# Patient Record
Sex: Female | Born: 1940 | Race: White | Hispanic: No | Smoking: Former smoker
Health system: Southern US, Community
[De-identification: ages and names within clinical notes are randomized; demographics above are authoritative.]

## PROBLEM LIST (undated history)

## (undated) DIAGNOSIS — Z972 Presence of dental prosthetic device (complete) (partial): Secondary | ICD-10-CM

## (undated) DIAGNOSIS — G43909 Migraine, unspecified, not intractable, without status migrainosus: Secondary | ICD-10-CM

## (undated) DIAGNOSIS — G20 Parkinson's disease: Secondary | ICD-10-CM

## (undated) DIAGNOSIS — R519 Headache, unspecified: Secondary | ICD-10-CM

## (undated) DIAGNOSIS — G20C Parkinsonism, unspecified: Secondary | ICD-10-CM

## (undated) DIAGNOSIS — M51369 Other intervertebral disc degeneration, lumbar region without mention of lumbar back pain or lower extremity pain: Secondary | ICD-10-CM

## (undated) DIAGNOSIS — K219 Gastro-esophageal reflux disease without esophagitis: Secondary | ICD-10-CM

## (undated) DIAGNOSIS — T8859XA Other complications of anesthesia, initial encounter: Secondary | ICD-10-CM

## (undated) DIAGNOSIS — E785 Hyperlipidemia, unspecified: Secondary | ICD-10-CM

## (undated) DIAGNOSIS — K589 Irritable bowel syndrome without diarrhea: Secondary | ICD-10-CM

## (undated) DIAGNOSIS — I10 Essential (primary) hypertension: Secondary | ICD-10-CM

## (undated) DIAGNOSIS — C801 Malignant (primary) neoplasm, unspecified: Secondary | ICD-10-CM

## (undated) DIAGNOSIS — M1990 Unspecified osteoarthritis, unspecified site: Secondary | ICD-10-CM

## (undated) DIAGNOSIS — F329 Major depressive disorder, single episode, unspecified: Secondary | ICD-10-CM

## (undated) DIAGNOSIS — K5790 Diverticulosis of intestine, part unspecified, without perforation or abscess without bleeding: Secondary | ICD-10-CM

## (undated) DIAGNOSIS — Z860100 Personal history of colon polyps, unspecified: Secondary | ICD-10-CM

## (undated) DIAGNOSIS — R42 Dizziness and giddiness: Secondary | ICD-10-CM

## (undated) DIAGNOSIS — F419 Anxiety disorder, unspecified: Secondary | ICD-10-CM

## (undated) DIAGNOSIS — C519 Malignant neoplasm of vulva, unspecified: Secondary | ICD-10-CM

## (undated) DIAGNOSIS — J449 Chronic obstructive pulmonary disease, unspecified: Secondary | ICD-10-CM

## (undated) DIAGNOSIS — F32A Depression, unspecified: Secondary | ICD-10-CM

## (undated) DIAGNOSIS — G20A1 Parkinson's disease without dyskinesia, without mention of fluctuations: Secondary | ICD-10-CM

## (undated) DIAGNOSIS — M5136 Other intervertebral disc degeneration, lumbar region: Secondary | ICD-10-CM

## (undated) DIAGNOSIS — T753XXA Motion sickness, initial encounter: Secondary | ICD-10-CM

## (undated) HISTORY — DX: Parkinson's disease: G20

## (undated) HISTORY — PX: TUBAL LIGATION: SHX77

## (undated) HISTORY — DX: Major depressive disorder, single episode, unspecified: F32.9

## (undated) HISTORY — DX: Irritable bowel syndrome, unspecified: K58.9

## (undated) HISTORY — DX: Parkinsonism, unspecified: G20.C

## (undated) HISTORY — DX: Personal history of colonic polyps: Z86.010

## (undated) HISTORY — DX: Unspecified osteoarthritis, unspecified site: M19.90

## (undated) HISTORY — DX: Malignant neoplasm of vulva, unspecified: C51.9

## (undated) HISTORY — DX: Chronic obstructive pulmonary disease, unspecified: J44.9

## (undated) HISTORY — DX: Migraine, unspecified, not intractable, without status migrainosus: G43.909

## (undated) HISTORY — DX: Hyperlipidemia, unspecified: E78.5

## (undated) HISTORY — DX: Diverticulosis of intestine, part unspecified, without perforation or abscess without bleeding: K57.90

## (undated) HISTORY — DX: Anxiety disorder, unspecified: F41.9

## (undated) HISTORY — DX: Personal history of colon polyps, unspecified: Z86.0100

## (undated) HISTORY — DX: Malignant (primary) neoplasm, unspecified: C80.1

## (undated) HISTORY — DX: Gastro-esophageal reflux disease without esophagitis: K21.9

## (undated) HISTORY — DX: Other intervertebral disc degeneration, lumbar region: M51.36

## (undated) HISTORY — DX: Essential (primary) hypertension: I10

## (undated) HISTORY — PX: CHOLECYSTECTOMY: SHX55

## (undated) HISTORY — DX: Other intervertebral disc degeneration, lumbar region without mention of lumbar back pain or lower extremity pain: M51.369

## (undated) HISTORY — DX: Depression, unspecified: F32.A

---

## 1971-11-26 HISTORY — PX: BREAST BIOPSY: SHX20

## 2004-10-09 ENCOUNTER — Ambulatory Visit: Payer: Self-pay | Admitting: Internal Medicine

## 2006-01-08 ENCOUNTER — Ambulatory Visit: Payer: Self-pay | Admitting: Internal Medicine

## 2006-09-17 ENCOUNTER — Ambulatory Visit: Payer: Self-pay | Admitting: Unknown Physician Specialty

## 2006-09-22 ENCOUNTER — Ambulatory Visit: Payer: Self-pay | Admitting: Internal Medicine

## 2007-01-12 ENCOUNTER — Ambulatory Visit: Payer: Self-pay | Admitting: Internal Medicine

## 2007-12-08 ENCOUNTER — Ambulatory Visit: Payer: Self-pay | Admitting: Internal Medicine

## 2007-12-30 ENCOUNTER — Ambulatory Visit: Payer: Self-pay | Admitting: General Surgery

## 2007-12-30 ENCOUNTER — Other Ambulatory Visit: Payer: Self-pay

## 2008-01-05 ENCOUNTER — Ambulatory Visit: Payer: Self-pay | Admitting: General Surgery

## 2008-01-19 ENCOUNTER — Ambulatory Visit: Payer: Self-pay | Admitting: Internal Medicine

## 2008-11-25 HISTORY — PX: KNEE ARTHROSCOPY: SUR90

## 2009-01-19 ENCOUNTER — Ambulatory Visit: Payer: Self-pay | Admitting: Internal Medicine

## 2009-02-20 ENCOUNTER — Ambulatory Visit: Payer: Self-pay | Admitting: General Practice

## 2009-03-08 ENCOUNTER — Ambulatory Visit: Payer: Self-pay | Admitting: General Practice

## 2009-03-14 ENCOUNTER — Ambulatory Visit: Payer: Self-pay | Admitting: General Practice

## 2009-10-02 ENCOUNTER — Ambulatory Visit: Payer: Self-pay | Admitting: Unknown Physician Specialty

## 2009-10-03 ENCOUNTER — Ambulatory Visit: Payer: Self-pay | Admitting: Unknown Physician Specialty

## 2009-10-13 ENCOUNTER — Ambulatory Visit: Payer: Self-pay | Admitting: General Practice

## 2009-10-18 ENCOUNTER — Inpatient Hospital Stay: Payer: Self-pay | Admitting: General Practice

## 2009-10-18 HISTORY — PX: TOTAL KNEE ARTHROPLASTY: SHX125

## 2010-01-22 ENCOUNTER — Ambulatory Visit: Payer: Self-pay | Admitting: Internal Medicine

## 2010-09-25 ENCOUNTER — Ambulatory Visit: Payer: Self-pay | Admitting: General Practice

## 2010-10-08 ENCOUNTER — Inpatient Hospital Stay: Payer: Self-pay | Admitting: General Practice

## 2010-10-08 HISTORY — PX: TOTAL KNEE ARTHROPLASTY: SHX125

## 2010-11-25 HISTORY — PX: KNEE ARTHROSCOPY: SUR90

## 2011-01-23 ENCOUNTER — Ambulatory Visit: Payer: Self-pay | Admitting: Internal Medicine

## 2011-09-26 ENCOUNTER — Ambulatory Visit: Payer: Self-pay | Admitting: Internal Medicine

## 2011-12-18 ENCOUNTER — Ambulatory Visit: Payer: Self-pay | Admitting: Internal Medicine

## 2012-01-03 ENCOUNTER — Emergency Department: Payer: Self-pay | Admitting: Internal Medicine

## 2012-01-03 LAB — BASIC METABOLIC PANEL
Anion Gap: 12 (ref 7–16)
BUN: 14 mg/dL (ref 7–18)
Calcium, Total: 9.2 mg/dL (ref 8.5–10.1)
Chloride: 103 mmol/L (ref 98–107)
Co2: 25 mmol/L (ref 21–32)
Creatinine: 1.03 mg/dL (ref 0.60–1.30)
EGFR (African American): >60
EGFR (Non-African Amer.): 56 — ABNORMAL LOW
Glucose: 94 mg/dL (ref 65–99)
Osmolality: 280 (ref 275–301)
Potassium: 4.3 mmol/L (ref 3.5–5.1)
Sodium: 140 mmol/L (ref 136–145)

## 2012-01-03 LAB — CBC
HCT: 39.2 % (ref 35.0–47.0)
HGB: 13.1 g/dL (ref 12.0–16.0)
MCH: 31.6 pg (ref 26.0–34.0)
MCHC: 33.5 g/dL (ref 32.0–36.0)
MCV: 94 fL (ref 80–100)
Platelet: 343 10*3/uL (ref 150–440)
RBC: 4.16 10*6/uL (ref 3.80–5.20)
RDW: 14.5 % (ref 11.5–14.5)
WBC: 7.8 10*3/uL (ref 3.6–11.0)

## 2012-01-28 ENCOUNTER — Ambulatory Visit: Payer: Self-pay | Admitting: Internal Medicine

## 2012-12-11 ENCOUNTER — Ambulatory Visit: Payer: Self-pay | Admitting: Internal Medicine

## 2012-12-18 ENCOUNTER — Ambulatory Visit: Payer: Self-pay | Admitting: Internal Medicine

## 2013-01-28 ENCOUNTER — Ambulatory Visit: Payer: Self-pay | Admitting: Internal Medicine

## 2014-01-18 ENCOUNTER — Ambulatory Visit: Payer: Self-pay | Admitting: Physical Medicine and Rehabilitation

## 2014-01-31 ENCOUNTER — Ambulatory Visit: Payer: Self-pay | Admitting: Internal Medicine

## 2014-02-02 ENCOUNTER — Ambulatory Visit: Payer: Self-pay | Admitting: Neurology

## 2014-02-07 ENCOUNTER — Ambulatory Visit: Payer: Self-pay | Admitting: Internal Medicine

## 2014-04-04 DIAGNOSIS — M5116 Intervertebral disc disorders with radiculopathy, lumbar region: Secondary | ICD-10-CM | POA: Insufficient documentation

## 2014-04-04 DIAGNOSIS — M5416 Radiculopathy, lumbar region: Secondary | ICD-10-CM | POA: Insufficient documentation

## 2014-04-04 DIAGNOSIS — M5136 Other intervertebral disc degeneration, lumbar region: Secondary | ICD-10-CM | POA: Insufficient documentation

## 2014-04-04 DIAGNOSIS — M5137 Other intervertebral disc degeneration, lumbosacral region: Secondary | ICD-10-CM | POA: Insufficient documentation

## 2014-07-08 DIAGNOSIS — I10 Essential (primary) hypertension: Secondary | ICD-10-CM | POA: Insufficient documentation

## 2014-07-08 DIAGNOSIS — F419 Anxiety disorder, unspecified: Secondary | ICD-10-CM

## 2014-07-08 DIAGNOSIS — F329 Major depressive disorder, single episode, unspecified: Secondary | ICD-10-CM | POA: Insufficient documentation

## 2014-07-08 DIAGNOSIS — G20 Parkinson's disease: Secondary | ICD-10-CM | POA: Insufficient documentation

## 2014-07-08 DIAGNOSIS — F32A Depression, unspecified: Secondary | ICD-10-CM | POA: Insufficient documentation

## 2014-07-08 DIAGNOSIS — E785 Hyperlipidemia, unspecified: Secondary | ICD-10-CM | POA: Insufficient documentation

## 2014-08-11 ENCOUNTER — Ambulatory Visit: Payer: Self-pay | Admitting: Internal Medicine

## 2014-12-05 ENCOUNTER — Ambulatory Visit: Payer: Self-pay | Admitting: Unknown Physician Specialty

## 2015-02-02 ENCOUNTER — Ambulatory Visit: Payer: Self-pay | Admitting: Internal Medicine

## 2015-02-07 ENCOUNTER — Ambulatory Visit: Payer: Self-pay | Admitting: Internal Medicine

## 2015-03-10 ENCOUNTER — Other Ambulatory Visit: Payer: Self-pay | Admitting: Internal Medicine

## 2015-03-10 DIAGNOSIS — R69 Illness, unspecified: Secondary | ICD-10-CM

## 2015-03-20 LAB — SURGICAL PATHOLOGY

## 2015-07-03 ENCOUNTER — Ambulatory Visit (INDEPENDENT_AMBULATORY_CARE_PROVIDER_SITE_OTHER): Payer: Medicare Other | Admitting: Urology

## 2015-07-03 ENCOUNTER — Encounter: Payer: Self-pay | Admitting: Urology

## 2015-07-03 VITALS — BP 114/73 | HR 97 | Ht 66.0 in | Wt 221.9 lb

## 2015-07-03 DIAGNOSIS — N952 Postmenopausal atrophic vaginitis: Secondary | ICD-10-CM | POA: Diagnosis not present

## 2015-07-03 DIAGNOSIS — R350 Frequency of micturition: Secondary | ICD-10-CM | POA: Insufficient documentation

## 2015-07-03 DIAGNOSIS — R300 Dysuria: Secondary | ICD-10-CM | POA: Diagnosis not present

## 2015-07-03 DIAGNOSIS — N3941 Urge incontinence: Secondary | ICD-10-CM

## 2015-07-03 LAB — URINALYSIS, COMPLETE
BILIRUBIN UA: NEGATIVE
Glucose, UA: NEGATIVE
Nitrite, UA: NEGATIVE
PH UA: 5 (ref 5.0–7.5)
Protein, UA: NEGATIVE
RBC, UA: NEGATIVE
Specific Gravity, UA: 1.025 (ref 1.005–1.030)
Urobilinogen, Ur: 1 mg/dL (ref 0.2–1.0)

## 2015-07-03 LAB — MICROSCOPIC EXAMINATION: WBC, UA: >30 /hpf — AB (ref 0–?)

## 2015-07-03 MED ORDER — MIRABEGRON ER 25 MG PO TB24: 25.0000 mg | ORAL_TABLET | Freq: Every day | ORAL | Status: DC

## 2015-07-03 NOTE — Addendum Note (Signed)
Addended by: Bettye Boeck on: 07/03/2015 04:29 PM   Modules accepted: Orders

## 2015-07-03 NOTE — Assessment & Plan Note (Addendum)
She has marked frequency and urgency with UUI and has had success with Detrol but not with oxybutynin or vesicare.  I am going to given her samples of Myrbetriq 25mg  and 50mg  to try and will reassess at 2 months.

## 2015-07-03 NOTE — Progress Notes (Signed)
In and Out Catheterization  Patient is present today for a I & O catheterization due to dysuria. Patient was cleaned and prepped in a sterile fashion with betadine and Lidocaine 2% jelly was instilled into the urethra.  A 14FR cath was inserted no complications were noted , 22ml of urine return was noted, urine was clear and yellow in color. A clean urine sample was collected for u/a. Bladder was drained  And catheter was removed with out difficulty.    Preformed by: Toniann Fail, LPN

## 2015-07-03 NOTE — Progress Notes (Signed)
62/70/3500 9:38 PM   Hannah Howard [birth year] [id number]  Referring provider: Idelle Crouch, MD [place], [place] [address]  Chief Complaint  Patient presents with  . Dysuria    HPI: Hannah Howard is a 74 yo WF who is sent in consultation by Dr. Doy Hutching for dysuria.   She can have concentrated urine with sediment and she can have severe intermittent burning in the vaginal area.    She had an unremarkable UA in June with a culture that was negative.   She has a vaginal cream that she uses prn but was causing additional pain.   She has not had UTI's but she has had marked frequency and urgency with UUI but no SUI.   She will has frequency 15+ times daily and nocturia 4-5x.   She was given tolteradine  in the past and that workedand  and then oxybutynin but it didn't help.  Most recently she has been on vesicare which didn't help.  She is on sinemet for Parkinsonism.    PMH: Past Medical History  Diagnosis Date  . GERD (gastroesophageal reflux disease)   . Anxiety   . Arthritis   . Depression   . Hyperlipidemia   . Parkinsonism     Surgical History: Past Surgical History  Procedure Laterality Date  . Knee arthroscopy Right 2010  . Knee arthroscopy Left 2012    Home Medications:    Medication List       This list is accurate as of: 07/03/15  3:52 PM.  Always use your most recent med list.               aspirin EC 81 MG tablet  Take by mouth.     carbidopa-levodopa 25-100 MG per tablet  Commonly known as:  SINEMET IR     celecoxib 200 MG capsule  Commonly known as:  CELEBREX  TAKE 1 CAPSULE EVERY DAY     clotrimazole-betamethasone cream  Commonly known as:  LOTRISONE  APPLY TOPICALLY TWO TIMES DAILY     hydrOXYzine 50 MG tablet  Commonly known as:  ATARAX/VISTARIL  TAKE ONE TABLET 3 TIMES DAILY AS NEEDED FOR ITCHING     lansoprazole 30 MG capsule  Commonly known as:  PREVACID     levocetirizine 5 MG tablet  Commonly known as:  XYZAL     lovastatin 40 MG tablet  Commonly known as:  MEVACOR     mirabegron ER 25 MG Tb24 tablet  Commonly known as:  MYRBETRIQ  Take 1 tablet (25 mg total) by mouth daily.     multivitamin capsule  Take by mouth.     sertraline 100 MG tablet  Commonly known as:  ZOLOFT     STOOL SOFTENER 100 MG capsule  Generic drug:  docusate sodium  Take by mouth.     traZODone 150 MG tablet  Commonly known as:  DESYREL     triamcinolone cream 0.1 %  Commonly known as:  KENALOG        Allergies: No Known Allergies  Family History: Family History  Problem Relation Age of Onset  . Hematuria    . Kidney failure    . Prostate cancer      Social History:  reports that she quit smoking about 15 years ago. Her smoking use included Cigarettes. She does not have any smokeless tobacco history on file. She reports that she does not drink alcohol or use illicit drugs.  ROS: UROLOGY Frequent  Urination?: Yes Hard to postpone urination?: Yes Burning/pain with urination?: No Get up at night to urinate?: Yes Leakage of urine?: Yes Urine stream starts and stops?: Yes Trouble starting stream?: No Do you have to strain to urinate?: No Blood in urine?: Yes Urinary tract infection?: No Sexually transmitted disease?: No Injury to kidneys or bladder?: No Painful intercourse?: No Weak stream?: No Currently pregnant?: No Vaginal bleeding?: No Last menstrual period?: n  Gastrointestinal Nausea?: Yes Vomiting?: Yes Indigestion/heartburn?: Yes Diarrhea?: Yes Constipation?: Yes  Constitutional Fever: No Night sweats?: No Weight loss?: No Fatigue?: Yes  Skin Skin rash/lesions?: No Itching?: No  Eyes Blurred vision?: No Double vision?: No  Ears/Nose/Throat Sore throat?: No Sinus problems?: Yes  Hematologic/Lymphatic Swollen glands?: No Easy bruising?: Yes  Cardiovascular Leg swelling?: Yes Chest pain?: No  Respiratory Cough?: No Shortness of breath?:  Yes  Endocrine Excessive thirst?: Yes  Musculoskeletal Back pain?: Yes Joint pain?: Yes  Neurological Headaches?: Yes Dizziness?: Yes  Psychologic Depression?: Yes Anxiety?: Yes  Physical Exam: BP 114/73 mmHg  Pulse 97  Ht 5\' 6"  (1.676 m)  Wt 221 lb 14.4 oz (100.653 kg)  BMI 35.83 kg/m2  Physical Exam  Constitutional: She is oriented to person, place, and time. She appears well-developed and well-nourished. No distress.  HENT:  Head: Normocephalic and atraumatic.  Neck: Normal range of motion. Neck supple. No thyromegaly present.  Cardiovascular: Normal rate, regular rhythm and normal heart sounds.   Pulmonary/Chest: Effort normal and breath sounds normal. No respiratory distress.  Abdominal: Soft. Bowel sounds are normal. She exhibits no mass. There is no tenderness. No hernia.  Genitourinary:  She has labial atrophy with some anterior fusion and introital stenosis.   The meatus is normal with good support and no leakage with coughing.  The bladder is palpably normal.   No adnexal masses are noted.   Cervix and uterus are atrophied.    Cath PVR was 57ml.    Lymphadenopathy:    She has no cervical adenopathy.       Right: No inguinal adenopathy present.       Left: No inguinal adenopathy present.  Neurological: She is alert and oriented to person, place, and time.  Skin: Skin is warm and dry.  Psychiatric: She has a normal mood and affect.     Laboratory Data: Lab Results  Component Value Date   WBC 7.8 01/03/2012   HGB 13.1 01/03/2012   HCT 39.2 01/03/2012   MCV 94 01/03/2012   PLT 343 01/03/2012    Lab Results  Component Value Date   CREATININE 1.03 01/03/2012    No results found for: PSA  No results found for: TESTOSTERONE  No results found for: HGBA1C  Urinalysis No results found for: COLORURINE, APPEARANCEUR, LABSPEC, PHURINE, GLUCOSEU, HGBUR, BILIRUBINUR, KETONESUR, PROTEINUR, UROBILINOGEN, NITRITE, LEUKOCYTESUR   UA has >30 WBC, 0-2 RBC's,  >10 Epis and Mod bacteria.  Cath UA has +bil but is otherwise unremarkable.   Assessment & Plan:    Problem List Items Addressed This Visit    Atrophic vaginitis    The burning she is experience is probably a combination of the atrophic vaginitis with stenosis and the frequency of her urination.   I am going to start her on topical estrogen and will have her return in  months for reevaluation.       Urinary frequency   Relevant Orders   BLADDER SCAN AMB NON-IMAGING   Urge incontinence    She has marked frequency and urgency with  UUI and has had success with Detrol but not with oxybutynin or vesicare.  I am going to given her samples of Myrbetriq 25mg  and 50mg  to try and will reassess at 2 months.         Relevant Medications   mirabegron ER (MYRBETRIQ) 25 MG TB24 tablet    Other Visit Diagnoses    Dysuria    -  Primary    Relevant Orders    Urinalysis, Complete       Return in about 2 months (around 09/02/2015).  Malka So, MD  [facility] [address], [place], [place] [address] [phone]

## 2015-07-03 NOTE — Assessment & Plan Note (Addendum)
The burning she is experience is probably a combination of the atrophic vaginitis with stenosis and the frequency of her urination.   I am going to start her on topical estrogen and will have her return in  months for reevaluation.   The script was sent to [facility] for a compounded product.

## 2015-07-04 LAB — URINALYSIS, COMPLETE
Bilirubin, UA: NEGATIVE
Glucose, UA: NEGATIVE
LEUKOCYTES UA: NEGATIVE
NITRITE UA: NEGATIVE
RBC UA: NEGATIVE
UUROB: 1 mg/dL (ref 0.2–1.0)
pH, UA: 5 (ref 5.0–7.5)

## 2015-07-04 LAB — MICROSCOPIC EXAMINATION
Bacteria, UA: NONE SEEN
RBC, UA: NONE SEEN /hpf (ref 0–?)

## 2015-07-10 ENCOUNTER — Encounter: Payer: Self-pay | Admitting: Speech Pathology

## 2015-07-10 ENCOUNTER — Encounter: Payer: Self-pay | Admitting: Physical Therapy

## 2015-07-10 ENCOUNTER — Ambulatory Visit: Payer: Medicare Other | Admitting: Physical Therapy

## 2015-07-10 ENCOUNTER — Ambulatory Visit: Payer: Medicare Other | Attending: Neurology | Admitting: Speech Pathology

## 2015-07-10 DIAGNOSIS — R29818 Other symptoms and signs involving the nervous system: Secondary | ICD-10-CM | POA: Diagnosis present

## 2015-07-10 DIAGNOSIS — R262 Difficulty in walking, not elsewhere classified: Secondary | ICD-10-CM | POA: Diagnosis present

## 2015-07-10 DIAGNOSIS — R490 Dysphonia: Secondary | ICD-10-CM

## 2015-07-10 NOTE — Therapy (Signed)
[place] MAIN [facility] SERVICES [address] [place], [place], [address] Phone: [phone]   Fax:  [phone]  Physical Therapy Evaluation  Patient Details  Name: Hannah Howard MRN: [id number] Date of Birth: May 09, 1941 Referring Provider:  Vladimir Crofts, MD  Encounter Date: 07/10/2015      PT End of Session - 07/10/15 1100    Visit Number 1   Number of Visits 17   Date for PT Re-Evaluation 08/07/15   PT Start Time 1010   PT Stop Time 1100   PT Time Calculation (min) 50 min   Equipment Utilized During Treatment Gait belt   Activity Tolerance Patient tolerated treatment well   Behavior During Therapy [facility] for tasks assessed/performed      Past Medical History  Diagnosis Date  . GERD (gastroesophageal reflux disease)   . Anxiety   . Arthritis   . Depression   . Hyperlipidemia   . Parkinsonism     Past Surgical History  Procedure Laterality Date  . Knee arthroscopy Right 2010  . Knee arthroscopy Left 2012    There were no vitals filed for this visit.  Visit Diagnosis:  Difficulty balancing  Difficulty in walking      Subjective Assessment - 07/10/15 1014    Subjective Patient is having difficulty with walking, getting up and down and balance.             [facility] PT Assessment - 07/10/15 0001    Assessment   Medical Diagnosis Parkinsons   Onset Date/Surgical Date 06/25/14   Hand Dominance Right   Next MD Visit 09/19/15   Prior Therapy no   Balance Screen   Has the patient fallen in the past 6 months No   Has the patient had a decrease in activity level because of a fear of falling?  Yes   Is the patient reluctant to leave their home because of a fear of falling?  No   Home Social worker Private residence   Living Arrangements Alone   Available Help at Discharge Friend(s)   Type of [place] to enter   Entrance Stairs-Number of Steps 14   Entrance Stairs-Rails  Right;Left   Home Layout Two level   Prior Function   Level of Independence Independent   Vocation Retired     Outcome measures:  TUG :11.88 sec 5 x sit to stand:21.73 with arms 10 MW: .73 m/sec 6 MW : 450 feet  Patient is having difficulty with stepping up a step without a rail, picking up an item off the floor, sit to stand, getting in and out of a car, carrying items and grocery bags Balance : unable to tandem stand, single leg stand Functional : unable to lunge or squat Gait : slow gait speed without AD and has sway and path deviation Strength is 4/5 BLE, 5/5 BUE Coordination: WNL Sensation: WNL                     PT Education - 07/10/15 1035    Education provided Yes   Education Details Educated about LSVT BIG   Person(s) Educated Patient   Methods Explanation   Comprehension Verbalized understanding             PT Long Term Goals - 07/10/15 1127    PT LONG TERM GOAL #1   Title Patient will be independent in home exercise program to improve strength/mobility for  better functional independence with ADLs 08/18/15   PT LONG TERM GOAL #2   Title Patient (< 13 years old) will complete five times sit to stand test in < 10 seconds indicating an increased LE strength and improved balance. 08/18/15   PT LONG TERM GOAL #3   Title Patient will increase six minute walk test distance to >1000 for progression to community ambulator and improve gait ability 08/18/15   PT LONG TERM GOAL #4   Title Patient will increase 10 meter walk test to >1.50m/s as to improve gait speed for better community ambulation and to reduce fall 08/18/15               Plan - 19-Jul-2015 1130    Clinical Impression Statement Patient presents with unsteady gait, decreased functional mobility and orders for LSVT BIG. She has difficutly with dynamic standing balance and has giat deviation in path with 6 MW test. She has increased risk of falls determined by outcome measures including 5 x  sit to stand, TUG and 10 MW test.    Pt will benefit from skilled therapeutic intervention in order to improve on the following deficits Abnormal gait;Decreased activity tolerance;Decreased strength;Pain;Decreased balance;Decreased mobility;Difficulty walking;Decreased safety awareness   Rehab Potential Good   PT Frequency 4x / week   PT Duration 4 weeks   PT Treatment/Interventions Balance training;Therapeutic exercise;Therapeutic activities;Functional mobility training;Stair training   PT Next Visit Plan LSVT BIG   Consulted and Agree with Plan of Care Patient          G-Codes - 07/19/15 1058    Functional Assessment Tool Used TUG, 10 MW, 6 MW, 5 x sit to stand   Functional Limitation Mobility: Walking and moving around   Mobility: Walking and Moving Around Current Status [phone]) At least 40 percent but less than 60 percent impaired, limited or restricted   Mobility: Walking and Moving Around Goal Status [phone]) At least 20 percent but less than 40 percent impaired, limited or restricted       Problem List Patient Active Problem List   Diagnosis Date Noted  . Atrophic vaginitis 07/03/2015  . Urinary frequency 07/03/2015  . Urge incontinence 07/03/2015  . Anxiety and depression 07/08/2014  . BP (high blood pressure) 07/08/2014  . HLD (hyperlipidemia) 07/08/2014  . Idiopathic Parkinson's disease 07/08/2014  . DDD (degenerative disc disease), lumbar 04/04/2014  . Neuritis or radiculitis due to rupture of lumbar intervertebral disc 04/04/2014    Alanson Puls 07-19-2015, 11:35 AM  [place] MAIN [facility] SERVICES [place], [place], [address] Phone: [phone]   Fax:  [phone]

## 2015-07-10 NOTE — Therapy (Signed)
[place] MAIN [facility] SERVICES [address] [place], [place], [address] Phone: [phone]   Fax:  [phone]  Speech Language Pathology Evaluation  Patient Details  Name: Hannah Howard MRN: [id number] Date of Birth: 1941-05-26 Referring Provider:  Vladimir Crofts, MD  Encounter Date: 07/10/2015      End of Session - 07/10/15 1012    Visit Number 1   Number of Visits 17   Date for SLP Re-Evaluation 08/18/15   SLP Start Time 0903   SLP Stop Time  0956   SLP Time Calculation (min) 53 min   Activity Tolerance Patient tolerated treatment well      Past Medical History  Diagnosis Date   GERD (gastroesophageal reflux disease)    Anxiety    Arthritis    Depression    Hyperlipidemia    Parkinsonism     Past Surgical History  Procedure Laterality Date   Knee arthroscopy Right 2010   Knee arthroscopy Left 2012    There were no vitals filed for this visit.  Visit Diagnosis: Dysphonia - Plan: SLP plan of care cert/re-cert      Subjective Assessment - 07/10/15 1010    Subjective Patient states that she was diagnosed with Parkinsonism approximately a year ago.  She does not report subjective changes in her speech and denies that others have indcated that she is not heard or has vocal quality changes.   Currently in Pain? No/denies            SLP Evaluation [facility] - 07/10/15 1010    SLP Visit Information   SLP Received On 07/10/15   Onset Date 03/08/2015   Subjective   Patient/Family Stated Goal Complete the recommended program   Prior Functional Status   Cognitive/Linguistic Baseline Within functional limits  Worsening vocal quality due to Parkinson's   Standardized Assessments   Standardized Assessments  Other Assessment  LSVT-LOUD Evaluation Protocol       LSVT-LOUD Voice Evaluation  Voice history: The patient reports that she was diagnosed with Parkinsonism about a year ago.  She denies subjective changes in  her speech or voice and denies others stating she cannot be heard.  Maximum phonation time for sustained ah: 17 seconds  Mean intensity during sustained ah: 62 dB  Mean intensity sustained during conversational speech: 65 dB  Average fundamental frequency during sustained ah: 151 Hz (3.4 STD below mean for age and gender)  Highest dynamic pitch when altering pitch from a low note to a high note: 668 Hz  Highest pitch during conversational speech: 866 Hz  Lowest dynamic pitch when altering from a high note to a low note: 109 Hz  Lowest pitch during conversational speech: 72 Hz Visi-Pitch: Multi-Dimensional Voice Program (MDVP)  MDVP extracts objective quantitative values (Relative Average Perturbation, Shimmer, Voice Turbulence Index, and Noise to Harmonic Ratio) on sustained phonation, which are displayed graphically and numerically in comparison to a built-in normative database.  The patient exhibited values outside the norm for Relative Average Perturbation, Shimmer, Voice Turbulence Index, and Noise to Harmonic Ratio.  Average fundamental frequency was 3.4 STD below the average for age and gender. The patient improved all parameters when cued to alter voicing (loud like me).          SLP Education - 07/10/15 1011    Education provided Yes   Education Details Patient informed regarding the nature of LSVT-LOUD   Person(s) Educated Patient   Methods Explanation   Comprehension Verbalized understanding  SLP Long Term Goals - August 02, 2015 1017    SLP LONG TERM GOAL #1   Title The patient will complete Daily Tasks (Maximum duration "ah", High/Lows, and Functional Phrases) at average loudness of 80 dB and with loud, good quality voice.    Time 4   Period Weeks   Status New   SLP LONG TERM GOAL #2   Title The patient will complete Hierarchal Speech Loudness reading drills (words/phrases, sentences, and paragraph) at average 75 dB and with loud, good quality voice.      Time 4   Period Weeks   Status New   SLP LONG TERM GOAL #3   Title The patient will complete homework daily.   Time 4   Period Weeks   Status New   SLP LONG TERM GOAL #4   Title The patient will participate in conversation, maintaining average loudness of 75 dB and loud, good quality voice.   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  Based on stimulability testing, the patient is judged to be a good candidate for the LSVT LOUD program.  It is recommended that the patient receive the LSVT LOUD program which is comprised of 16 intensive sessions (4 times per week for 4 weeks, one hour sessions).  Prognosis for improvement is good based on his motivation, stimulability, and strong family support.  LSVT LOUD has been documented in the literature as efficacious for individuals with Parkinson's disease.     Speech Therapy Frequency 4x / week   Duration 4 weeks   Treatment/Interventions Other (comment)  LSVT-LOUD protocol   Potential to Achieve Goals Good   Potential Considerations Ability to learn/carryover information;Cooperation/participation level;Medical prognosis;Previous level of function;Severity of impairments;Family/community support;Other (comment)  Stimulability   SLP Home Exercise Plan LSVT-LOUD daily homework   Consulted and Agree with Plan of Care Patient          G-Codes - Aug 02, 2015 1019    Functional Assessment Tool Used LSVT-LOUD evaluation protocol   Functional Limitations Voice   Voice Current Status [phone]) At least 40 percent but less than 60 percent impaired, limited or restricted   Voice Goal Status (S8546) At least 1 percent but less than 20 percent impaired, limited or restricted      Problem List Patient Active Problem List   Diagnosis Date  Noted   Atrophic vaginitis 07/03/2015   Urinary frequency 07/03/2015   Urge incontinence 07/03/2015   Anxiety and depression 07/08/2014   BP (high blood pressure) 07/08/2014   HLD (hyperlipidemia) 07/08/2014   Idiopathic Parkinson's disease 07/08/2014   DDD (degenerative disc disease), lumbar 04/04/2014   Neuritis or radiculitis due to rupture of lumbar intervertebral disc 04/04/2014   Leroy Sea, MS/CCC- SLP  Lou Miner 08-02-15, 10:35 AM  [place] [address] [place], [place], [address] Phone: [phone]   Fax:  [phone]

## 2015-07-17 ENCOUNTER — Ambulatory Visit: Payer: Medicare Other | Admitting: Speech Pathology

## 2015-07-17 ENCOUNTER — Encounter: Payer: Self-pay | Admitting: Physical Therapy

## 2015-07-17 ENCOUNTER — Encounter: Payer: Self-pay | Admitting: Speech Pathology

## 2015-07-17 ENCOUNTER — Ambulatory Visit: Payer: Medicare Other | Admitting: Physical Therapy

## 2015-07-17 DIAGNOSIS — R262 Difficulty in walking, not elsewhere classified: Secondary | ICD-10-CM

## 2015-07-17 DIAGNOSIS — R29818 Other symptoms and signs involving the nervous system: Secondary | ICD-10-CM

## 2015-07-17 DIAGNOSIS — R490 Dysphonia: Secondary | ICD-10-CM | POA: Diagnosis not present

## 2015-07-17 NOTE — Therapy (Signed)
[place] MAIN [facility] SERVICES [address] [place], [place], [address] Phone: [phone]   Fax:  [phone]  Physical Therapy Treatment  Patient Details  Name: Hannah Howard MRN: [id number] Date of Birth: Mar 25, 1941 Referring Provider:  Vladimir Crofts, MD  Encounter Date: 07/17/2015      PT End of Session - 07/17/15 1035    Visit Number 2   Number of Visits 17   Date for PT Re-Evaluation 08/07/15   PT Start Time 1000   PT Stop Time 1100   PT Time Calculation (min) 60 min   Equipment Utilized During Treatment Gait belt   Activity Tolerance Patient tolerated treatment well   Behavior During Therapy [facility] for tasks assessed/performed      Past Medical History  Diagnosis Date  . GERD (gastroesophageal reflux disease)   . Anxiety   . Arthritis   . Depression   . Hyperlipidemia   . Parkinsonism     Past Surgical History  Procedure Laterality Date  . Knee arthroscopy Right 2010  . Knee arthroscopy Left 2012    There were no vitals filed for this visit.  Visit Diagnosis:  Difficulty balancing  Difficulty in walking      Subjective Assessment - 07/17/15 1033    Subjective Patient is doing well and has decreased standing balance with activities.    Currently in Pain? No/denies        Floor to ceiling x 10 reps, side to side 10 reps, step and reach forward x 10 reps, step and reach backwards x 10, step and reach sideways x 10 , Rock and reach forward/backward x 10 , Rock and reach sideways x 10, functional tasks; 1 sit to stand  Continues to have balance deficits typical with diagnosis. Patient performs beginning level exercises without pain behaviors and needs verbal cuing for postural alignment and head positioning                          PT Education - 07/17/15 1034    Education provided Yes   Person(s) Educated Patient   Methods Explanation   Comprehension Verbalized understanding              PT Long Term Goals - 07/10/15 1127    PT LONG TERM GOAL #1   Title Patient will be independent in home exercise program to improve strength/mobility for better functional independence with ADLs 08/18/15   PT LONG TERM GOAL #2   Title Patient (< 34 years old) will complete five times sit to stand test in < 10 seconds indicating an increased LE strength and improved balance. 08/18/15   PT LONG TERM GOAL #3   Title Patient will increase six minute walk test distance to >1000 for progression to community ambulator and improve gait ability 08/18/15   PT LONG TERM GOAL #4   Title Patient will increase 10 meter walk test to >1.54m/s as to improve gait speed for better community ambulation and to reduce fall 08/18/15               Plan - 07/17/15 1038    Clinical Impression Statement Patient was instructed in LSVT  BIG exercise and she needed to use a chair for balance durng standing fwd and bwd stepping. Max cueing needed to appropriately perform LSVT tasks with leg, hand, and head position   Pt will benefit from skilled therapeutic intervention in order to improve on the following  deficits Abnormal gait;Decreased activity tolerance;Decreased strength;Pain;Decreased balance;Decreased mobility;Difficulty walking;Decreased safety awareness   PT Frequency 4x / week   PT Duration 4 weeks   PT Treatment/Interventions Balance training;Therapeutic exercise;Therapeutic activities;Functional mobility training;Stair training   PT Next Visit Plan LSVT BIG        Problem List Patient Active Problem List   Diagnosis Date Noted  . Atrophic vaginitis 07/03/2015  . Urinary frequency 07/03/2015  . Urge incontinence 07/03/2015  . Anxiety and depression 07/08/2014  . BP (high blood pressure) 07/08/2014  . HLD (hyperlipidemia) 07/08/2014  . Idiopathic Parkinson's disease 07/08/2014  . DDD (degenerative disc disease), lumbar 04/04/2014  . Neuritis or radiculitis due to rupture of lumbar  intervertebral disc 04/04/2014    Alanson Puls 07/17/2015, 10:44 AM  [place] MAIN [facility] SERVICES [place], [place], [address] Phone: [phone]   Fax:  [phone]

## 2015-07-17 NOTE — Therapy (Signed)
[place] MAIN [facility] SERVICES [address] [place], [place], [address] Phone: [phone]   Fax:  [phone]  Speech Language Pathology Treatment  Patient Details  Name: Hannah Howard MRN: [id number] Date of Birth: January 19, 1941 Referring Provider:  Vladimir Crofts, MD  Encounter Date: 07/17/2015      End of Session - 07/17/15 1238    Visit Number 2   Number of Visits 17   Date for SLP Re-Evaluation 08/18/15   SLP Start Time 0900   SLP Stop Time  0958   SLP Time Calculation (min) 58 min   Activity Tolerance Patient tolerated treatment well      Past Medical History  Diagnosis Date  . GERD (gastroesophageal reflux disease)   . Anxiety   . Arthritis   . Depression   . Hyperlipidemia   . Parkinsonism     Past Surgical History  Procedure Laterality Date  . Knee arthroscopy Right 2010  . Knee arthroscopy Left 2012    There were no vitals filed for this visit.  Visit Diagnosis: Dysphonia      Subjective Assessment - 07/17/15 1237    Subjective The patient has many questions regarding Parkinson's disease.   Currently in Pain? No/denies               ADULT SLP TREATMENT - 07/17/15 0001    General Information   Behavior/Cognition Alert;Cooperative;Pleasant mood   Treatment Provided   Treatment provided Cognitive-Linquistic   Pain Assessment   Pain Assessment No/denies pain   Cognitive-Linquistic Treatment   Treatment focused on Other (comment)  LSVT-LOUD   Skilled Treatment Daily Task #1: Average 10 seconds, 80 dB. Requires max cues for quality.  Daily Task 2: Highs: 15 high pitched "ah" given mod-max cues. Lows: 15 low pitched "ah" given mod-max cues. Daily task #3: Average 75 dB.  Hierarchal speech loudness drill: Read sentences, 70 dB. Homework: assignments given.  Off the cuff remarks: average 63 dB.   Assessment / Recommendations / Plan   Plan Continue with current plan of care   Progression Toward Goals   Progression toward goals Progressing toward goals          SLP Education - 07/17/15 1238    Education provided Yes   Education Details LSVT-LOUD   Person(s) Educated Patient   Methods Explanation;Demonstration;Verbal cues;Handout   Comprehension Verbalized understanding;Returned demonstration;Need further instruction            SLP Long Term Goals - 07/10/15 1017    SLP LONG TERM GOAL #1   Title The patient will complete Daily Tasks (Maximum duration "ah", High/Lows, and Functional Phrases) at average loudness of 80 dB and with loud, good quality voice.    Time 4   Period Weeks   Status New   SLP LONG TERM GOAL #2   Title The patient will complete Hierarchal Speech Loudness reading drills (words/phrases, sentences, and paragraph) at average 75 dB and with loud, good quality voice.     Time 4   Period Weeks   Status New   SLP LONG TERM GOAL #3   Title The patient will complete homework daily.   Time 4   Period Weeks   Status New   SLP LONG TERM GOAL #4   Title The patient will participate in conversation, maintaining average loudness of 75 dB and loud, good quality voice.   Time 4   Period Weeks   Status New  Plan - 07/17/15 1239    Clinical Impression Statement The patient is completing daily tasks and hierarchal speech drill tasks with loud, good quality voice given mod-max SLP cues for quality.     Speech Therapy Frequency 4x / week   Duration 4 weeks   Treatment/Interventions Other (comment)  LSVT-LOUD   Potential to Achieve Goals Good   Potential Considerations Ability to learn/carryover information;Cooperation/participation level;Medical prognosis;Previous level of function;Severity of impairments;Family/community support;Other (comment)   SLP Home Exercise Plan LSVT-LOUD daily homework   Consulted and Agree with Plan of Care Patient        Problem List Patient Active Problem List   Diagnosis Date Noted  . Atrophic vaginitis 07/03/2015  .  Urinary frequency 07/03/2015  . Urge incontinence 07/03/2015  . Anxiety and depression 07/08/2014  . BP (high blood pressure) 07/08/2014  . HLD (hyperlipidemia) 07/08/2014  . Idiopathic Parkinson's disease 07/08/2014  . DDD (degenerative disc disease), lumbar 04/04/2014  . Neuritis or radiculitis due to rupture of lumbar intervertebral disc 04/04/2014   Hannah Sea, MS/CCC- SLP  Hannah Howard 07/17/2015, 12:40 PM  [place] MAIN [facility] SERVICES [address] [place], [place], [address] Phone: [phone]   Fax:  [phone]

## 2015-07-18 ENCOUNTER — Ambulatory Visit: Payer: Medicare Other | Admitting: Speech Pathology

## 2015-07-18 ENCOUNTER — Ambulatory Visit: Payer: Medicare Other | Admitting: Physical Therapy

## 2015-07-18 ENCOUNTER — Encounter: Payer: Self-pay | Admitting: Physical Therapy

## 2015-07-18 ENCOUNTER — Encounter: Payer: Self-pay | Admitting: Speech Pathology

## 2015-07-18 DIAGNOSIS — R29818 Other symptoms and signs involving the nervous system: Secondary | ICD-10-CM

## 2015-07-18 DIAGNOSIS — R490 Dysphonia: Secondary | ICD-10-CM

## 2015-07-18 DIAGNOSIS — R262 Difficulty in walking, not elsewhere classified: Secondary | ICD-10-CM

## 2015-07-18 NOTE — Therapy (Signed)
[place] MAIN [facility] SERVICES [address] [place], [place], [address] Phone: [phone]   Fax:  [phone]  Physical Therapy Treatment  Patient Details  Name: Hannah Howard MRN: [id number] Date of Birth: 08/17/1941 Referring Provider:  Vladimir Crofts, MD  Encounter Date: 07/18/2015      PT End of Session - 07/18/15 1007    Visit Number 3   Number of Visits 17   Date for PT Re-Evaluation 08/07/15   PT Start Time 1000   PT Stop Time 1100   PT Time Calculation (min) 60 min   Equipment Utilized During Treatment Gait belt   Activity Tolerance Patient tolerated treatment well   Behavior During Therapy [facility] for tasks assessed/performed      Past Medical History  Diagnosis Date  . GERD (gastroesophageal reflux disease)   . Anxiety   . Arthritis   . Depression   . Hyperlipidemia   . Parkinsonism     Past Surgical History  Procedure Laterality Date  . Knee arthroscopy Right 2010  . Knee arthroscopy Left 2012    There were no vitals filed for this visit.  Visit Diagnosis:  Difficulty balancing  Difficulty in walking      Subjective Assessment - 07/18/15 1006    Subjective Patient has some questions with sequencing of exericses from HEP.                 Floor to ceiling x 10 reps, side to side 10 reps, step and reach forward x 10 reps, step and reach backwards x 10, step and reach sideways x 10 , Rock and reach forward/backward x 10 , Rock and reach sideways x 10, functional tasks; 1 sit to stand  Continues to have balance deficits typical with diagnosis. Patient performs beginning level exercises without pain behaviors and needs verbal cuing for postural alignment and head positioning CGA to SBA for safety with activities.  Uses to increase intensity and amplitude of movements throughout session                                   PT Education - 07/18/15 1006    Education provided Yes   Education Details LSVT BIG   Person(s) Educated Patient   Methods Explanation   Comprehension Verbalized understanding             PT Long Term Goals - 07/10/15 1127    PT LONG TERM GOAL #1   Title Patient will be independent in home exercise program to improve strength/mobility for better functional independence with ADLs 08/18/15   PT LONG TERM GOAL #2   Title Patient (< 3 years old) will complete five times sit to stand test in < 10 seconds indicating an increased LE strength and improved balance. 08/18/15   PT LONG TERM GOAL #3   Title Patient will increase six minute walk test distance to >1000 for progression to community ambulator and improve gait ability 08/18/15   PT LONG TERM GOAL #4   Title Patient will increase 10 meter walk test to >1.54m/s as to improve gait speed for better community ambulation and to reduce fall 08/18/15               Plan - 07/18/15 1008    Clinical Impression Statement Max cueing needed to appropriately perform LSVT tasks with leg, hand, and head position   Pt will benefit from  skilled therapeutic intervention in order to improve on the following deficits Abnormal gait;Decreased activity tolerance;Decreased strength;Pain;Decreased balance;Decreased mobility;Difficulty walking;Decreased safety awareness   Rehab Potential Good   PT Frequency 4x / week   PT Duration 4 weeks   PT Treatment/Interventions Balance training;Therapeutic exercise;Therapeutic activities;Functional mobility training;Stair training   PT Next Visit Plan LSVT BIG   Consulted and Agree with Plan of Care Patient        Problem List Patient Active Problem List   Diagnosis Date Noted  . Atrophic vaginitis 07/03/2015  . Urinary frequency 07/03/2015  . Urge incontinence 07/03/2015  . Anxiety and depression 07/08/2014  . BP (high blood pressure) 07/08/2014  . HLD (hyperlipidemia) 07/08/2014  . Idiopathic Parkinson's disease 07/08/2014  . DDD (degenerative disc disease),  lumbar 04/04/2014  . Neuritis or radiculitis due to rupture of lumbar intervertebral disc 04/04/2014    Alanson Puls 07/18/2015, 10:11 AM  [place] MAIN [facility] SERVICES [place], [place], [address] Phone: [phone]   Fax:  [phone]

## 2015-07-18 NOTE — Therapy (Signed)
[place] MAIN [facility] SERVICES [address] [place], [place], [address] Phone: [phone]   Fax:  [phone]  Speech Language Pathology Treatment  Patient Details  Name: Hannah Howard MRN: [id number] Date of Birth: Feb 23, 1941 Referring Provider:  Vladimir Crofts, MD  Encounter Date: 07/18/2015      End of Session - 07/18/15 1002    Visit Number 3   Number of Visits 17   Date for SLP Re-Evaluation 08/18/15   SLP Start Time 0900   SLP Stop Time  37   SLP Time Calculation (min) 62 min   Activity Tolerance Patient tolerated treatment well      Past Medical History  Diagnosis Date  . GERD (gastroesophageal reflux disease)   . Anxiety   . Arthritis   . Depression   . Hyperlipidemia   . Parkinsonism     Past Surgical History  Procedure Laterality Date  . Knee arthroscopy Right 2010  . Knee arthroscopy Left 2012    There were no vitals filed for this visit.  Visit Diagnosis: Dysphonia      Subjective Assessment - 07/18/15 1001    Subjective The patient is reluctant to use her loud voice outside of structured tasks.   Currently in Pain? No/denies               ADULT SLP TREATMENT - 07/18/15 0001    General Information   Behavior/Cognition Alert;Cooperative;Pleasant mood   Treatment Provided   Treatment provided Cognitive-Linquistic   Pain Assessment   Pain Assessment No/denies pain   Cognitive-Linquistic Treatment   Treatment focused on Voice;Other (comment)  LSVT-LOUD   Skilled Treatment Daily Task #1: Average 12 seconds, 82 dB. Requires mod cues for quality.  Daily Task 2: Highs: 15 high pitched "ah" given mod cues. Lows: 15 low pitched "ah" given mod cues. Daily task #3: Average 75 dB.  Hierarchal speech loudness drill: Read sentences, 73 dB. Homework: assignments given.  Off the cuff remarks: average 63 dB.   Assessment / Recommendations / Plan   Plan Continue with current plan of care   Progression Toward  Goals   Progression toward goals Progressing toward goals          SLP Education - 07/18/15 1001    Education provided Yes   Education Details LSVT-LOUD   Person(s) Educated Patient   Methods Explanation;Demonstration;Verbal cues;Handout   Comprehension Verbalized understanding;Returned demonstration;Need further instruction            SLP Long Term Goals - 07/10/15 1017    SLP LONG TERM GOAL #1   Title The patient will complete Daily Tasks (Maximum duration "ah", High/Lows, and Functional Phrases) at average loudness of 80 dB and with loud, good quality voice.    Time 4   Period Weeks   Status New   SLP LONG TERM GOAL #2   Title The patient will complete Hierarchal Speech Loudness reading drills (words/phrases, sentences, and paragraph) at average 75 dB and with loud, good quality voice.     Time 4   Period Weeks   Status New   SLP LONG TERM GOAL #3   Title The patient will complete homework daily.   Time 4   Period Weeks   Status New   SLP LONG TERM GOAL #4   Title The patient will participate in conversation, maintaining average loudness of 75 dB and loud, good quality voice.   Time 4   Period Weeks   Status New  Plan - 07/18/15 1002    Clinical Impression Statement The patient is completing daily tasks and hierarchal speech drill tasks with loud, good quality voice given fewer SLP cues for quality.  She is reluctant to use her stronger voice outside of structured tasks.   Speech Therapy Frequency 4x / week   Duration 4 weeks   Treatment/Interventions Other (comment)  LSVT-LOUD   Potential to Achieve Goals Good   Potential Considerations Ability to learn/carryover information;Cooperation/participation level;Medical prognosis;Previous level of function;Severity of impairments;Family/community support;Other (comment)   SLP Home Exercise Plan LSVT-LOUD daily homework   Consulted and Agree with Plan of Care Patient        Problem List Patient  Active Problem List   Diagnosis Date Noted  . Atrophic vaginitis 07/03/2015  . Urinary frequency 07/03/2015  . Urge incontinence 07/03/2015  . Anxiety and depression 07/08/2014  . BP (high blood pressure) 07/08/2014  . HLD (hyperlipidemia) 07/08/2014  . Idiopathic Parkinson's disease 07/08/2014  . DDD (degenerative disc disease), lumbar 04/04/2014  . Neuritis or radiculitis due to rupture of lumbar intervertebral disc 04/04/2014   Leroy Sea, MS/CCC- SLP  Lou Miner 07/18/2015, 10:03 AM  [place] MAIN [facility] SERVICES [address] [place], [place], [address] Phone: [phone]   Fax:  [phone]

## 2015-07-19 ENCOUNTER — Ambulatory Visit: Payer: Medicare Other | Admitting: Speech Pathology

## 2015-07-19 ENCOUNTER — Ambulatory Visit: Payer: Medicare Other | Admitting: Physical Therapy

## 2015-07-19 ENCOUNTER — Encounter: Payer: Self-pay | Admitting: Speech Pathology

## 2015-07-19 ENCOUNTER — Encounter: Payer: Self-pay | Admitting: Physical Therapy

## 2015-07-19 DIAGNOSIS — R262 Difficulty in walking, not elsewhere classified: Secondary | ICD-10-CM

## 2015-07-19 DIAGNOSIS — R490 Dysphonia: Secondary | ICD-10-CM | POA: Diagnosis not present

## 2015-07-19 DIAGNOSIS — R29818 Other symptoms and signs involving the nervous system: Secondary | ICD-10-CM

## 2015-07-19 NOTE — Therapy (Signed)
[place] MAIN [facility] SERVICES [address] [place], [place], [address] Phone: [phone]   Fax:  [phone]  Physical Therapy Treatment  Patient Details  Name: Hannah Howard MRN: [id number] Date of Birth: [birth year] Referring Provider:  Vladimir Crofts, MD  Encounter Date: 07/19/2015      PT End of Session - 07/19/15 1040    Visit Number 4   Number of Visits 17   Date for PT Re-Evaluation 08/07/15   PT Start Time 1000   PT Stop Time 1100   PT Time Calculation (min) 60 min   Equipment Utilized During Treatment Gait belt   Activity Tolerance Patient tolerated treatment well   Behavior During Therapy [facility] for tasks assessed/performed      Past Medical History  Diagnosis Date  . GERD (gastroesophageal reflux disease)   . Anxiety   . Arthritis   . Depression   . Hyperlipidemia   . Parkinsonism     Past Surgical History  Procedure Laterality Date  . Knee arthroscopy Right 2010  . Knee arthroscopy Left 2012    There were no vitals filed for this visit.  Visit Diagnosis:  Difficulty balancing  Difficulty in walking      Subjective Assessment - 07/19/15 1039    Subjective Patient has some questions with sequencing of exericses from HEP.   Currently in Pain? No/denies         Floor to ceiling x 10 reps, side to side 10 reps, step and reach forward x 10 reps, step and reach backwards x 10, step and reach sideways x 10 , Rock and reach forward/backward x 10 , Rock and reach sideways x 10, functional tasks; 1 sit to stand  Continues to have balance deficits typical with diagnosis. Patient performs beginning level exercises without pain behaviors and needs verbal cuing for postural alignment and head positioning CGA to SBA for safety with activities. Uses to increase intensity and amplitude of movements throughout session                         PT Education - 07/19/15 1040    Education provided Yes   Education Details LSVT BIG   Person(s) Educated Patient   Methods Explanation   Comprehension Verbalized understanding             PT Long Term Goals - 07/10/15 1127    PT LONG TERM GOAL #1   Title Patient will be independent in home exercise program to improve strength/mobility for better functional independence with ADLs 08/18/15   PT LONG TERM GOAL #2   Title Patient (< 30 years old) will complete five times sit to stand test in < 10 seconds indicating an increased LE strength and improved balance. 08/18/15   PT LONG TERM GOAL #3   Title Patient will increase six minute walk test distance to >1000 for progression to community ambulator and improve gait ability 08/18/15   PT LONG TERM GOAL #4   Title Patient will increase 10 meter walk test to >1.53m/s as to improve gait speed for better community ambulation and to reduce fall 08/18/15               Plan - 07/19/15 1041    Clinical Impression Statement Patient continues to demonstrate some in coordination of movement with select exercises such as rock and reach and stepping backwards. He responds well to verbal and tactile cues to correct form and technique  Pt will benefit from skilled therapeutic intervention in order to improve on the following deficits Abnormal gait;Decreased activity tolerance;Decreased strength;Pain;Decreased balance;Decreased mobility;Difficulty walking;Decreased safety awareness   Rehab Potential Good   PT Frequency 4x / week   PT Duration 4 weeks   PT Treatment/Interventions Balance training;Therapeutic exercise;Therapeutic activities;Functional mobility training;Stair training   PT Next Visit Plan LSVT BIG   Consulted and Agree with Plan of Care Patient        Problem List Patient Active Problem List   Diagnosis Date Noted  . Atrophic vaginitis 07/03/2015  . Urinary frequency 07/03/2015  . Urge incontinence 07/03/2015  . Anxiety and depression 07/08/2014  . BP (high blood pressure)  07/08/2014  . HLD (hyperlipidemia) 07/08/2014  . Idiopathic Parkinson's disease 07/08/2014  . DDD (degenerative disc disease), lumbar 04/04/2014  . Neuritis or radiculitis due to rupture of lumbar intervertebral disc 04/04/2014    Alanson Puls 07/19/2015, 11:03 AM  [place] MAIN [facility] SERVICES [place], [place], [address] Phone: [phone]   Fax:  [phone]

## 2015-07-19 NOTE — Therapy (Signed)
[place] MAIN [facility] SERVICES [address] [place], [place], [address] Phone: [phone]   Fax:  [phone]  Speech Language Pathology Treatment  Patient Details  Name: Hannah Howard MRN: [id number] Date of Birth: 03-04-1941 Referring Provider:  Vladimir Crofts, MD  Encounter Date: 07/19/2015      End of Session - 07/19/15 1223    Visit Number 4   Number of Visits 17   Date for SLP Re-Evaluation 08/18/15   SLP Start Time 0900   SLP Stop Time  0956   SLP Time Calculation (min) 56 min   Activity Tolerance Patient tolerated treatment well      Past Medical History  Diagnosis Date  . GERD (gastroesophageal reflux disease)   . Anxiety   . Arthritis   . Depression   . Hyperlipidemia   . Parkinsonism     Past Surgical History  Procedure Laterality Date  . Knee arthroscopy Right 2010  . Knee arthroscopy Left 2012    There were no vitals filed for this visit.  Visit Diagnosis: Dysphonia      Subjective Assessment - 07/19/15 1223    Subjective The patient enjoys the reading portion of our sessions.   Currently in Pain? No/denies               ADULT SLP TREATMENT - 07/19/15 0001    General Information   Behavior/Cognition Alert;Cooperative;Pleasant mood   Treatment Provided   Treatment provided Cognitive-Linquistic   Pain Assessment   Pain Assessment No/denies pain   Cognitive-Linquistic Treatment   Treatment focused on Voice;Other (comment)  LSVT-LOUD   Skilled Treatment Daily Task #1: Average 12 seconds, 82 dB. Requires mod cues for quality.  Daily Task 2: Highs: 15 high pitched "ah" given mod cues. Lows: 15 low pitched "ah" given mod cues. Daily task #3: Average 75 dB.  Hierarchal speech loudness drill: Read sentences, 73 dB. Homework: assignments given.  Off the cuff remarks: average 63 dB.   Assessment / Recommendations / Plan   Plan Continue with current plan of care   Progression Toward Goals   Progression  toward goals Progressing toward goals          SLP Education - 07/19/15 1223    Education provided Yes   Education Details LSVT-LOUD   Person(s) Educated Patient   Methods Explanation;Demonstration;Verbal cues;Handout   Comprehension Verbalized understanding;Returned demonstration;Need further instruction            SLP Long Term Goals - 07/10/15 1017    SLP LONG TERM GOAL #1   Title The patient will complete Daily Tasks (Maximum duration "ah", High/Lows, and Functional Phrases) at average loudness of 80 dB and with loud, good quality voice.    Time 4   Period Weeks   Status New   SLP LONG TERM GOAL #2   Title The patient will complete Hierarchal Speech Loudness reading drills (words/phrases, sentences, and paragraph) at average 75 dB and with loud, good quality voice.     Time 4   Period Weeks   Status New   SLP LONG TERM GOAL #3   Title The patient will complete homework daily.   Time 4   Period Weeks   Status New   SLP LONG TERM GOAL #4   Title The patient will participate in conversation, maintaining average loudness of 75 dB and loud, good quality voice.   Time 4   Period Weeks   Status New  Plan - 07/19/15 1224    Clinical Impression Statement The patient is completing daily tasks and hierarchal speech drill tasks with loud, good quality voice given fewer SLP cues for quality.  She tried using the loud voice with friends and they said she was yelling.  After questioning, it became evident that she was using the loudness of daily task #1 vs. the speaking loudness.   Speech Therapy Frequency 4x / week   Duration 4 weeks   Treatment/Interventions Other (comment)  LSVT-LOUD   Potential to Achieve Goals Good   Potential Considerations Ability to learn/carryover information;Cooperation/participation level;Medical prognosis;Previous level of function;Severity of impairments;Family/community support;Other (comment)   SLP Home Exercise Plan LSVT-LOUD daily  homework   Consulted and Agree with Plan of Care Patient        Problem List Patient Active Problem List   Diagnosis Date Noted  . Atrophic vaginitis 07/03/2015  . Urinary frequency 07/03/2015  . Urge incontinence 07/03/2015  . Anxiety and depression 07/08/2014  . BP (high blood pressure) 07/08/2014  . HLD (hyperlipidemia) 07/08/2014  . Idiopathic Parkinson's disease 07/08/2014  . DDD (degenerative disc disease), lumbar 04/04/2014  . Neuritis or radiculitis due to rupture of lumbar intervertebral disc 04/04/2014   Leroy Sea, MS/CCC- SLP  Lou Miner 07/19/2015, 12:26 PM  [place] MAIN [facility] SERVICES [address] [place], [place], [address] Phone: [phone]   Fax:  [phone]

## 2015-07-20 ENCOUNTER — Encounter: Payer: Self-pay | Admitting: Speech Pathology

## 2015-07-20 ENCOUNTER — Encounter: Payer: Self-pay | Admitting: Physical Therapy

## 2015-07-20 ENCOUNTER — Ambulatory Visit: Payer: Medicare Other | Admitting: Physical Therapy

## 2015-07-20 ENCOUNTER — Ambulatory Visit: Payer: Medicare Other | Admitting: Speech Pathology

## 2015-07-20 DIAGNOSIS — R490 Dysphonia: Secondary | ICD-10-CM | POA: Diagnosis not present

## 2015-07-20 DIAGNOSIS — R262 Difficulty in walking, not elsewhere classified: Secondary | ICD-10-CM

## 2015-07-20 DIAGNOSIS — R29818 Other symptoms and signs involving the nervous system: Secondary | ICD-10-CM

## 2015-07-20 NOTE — Therapy (Signed)
[place] MAIN [facility] SERVICES [address] [place], [place], [address] Phone: [phone]   Fax:  [phone]  Physical Therapy Treatment  Patient Details  Name: Hannah Howard MRN: [id number] Date of Birth: 1941-09-22 Referring Provider:  Vladimir Crofts, MD  Encounter Date: 07/20/2015      PT End of Session - 07/20/15 1011    Visit Number 5   Number of Visits 17   Date for PT Re-Evaluation 08/07/15   PT Start Time 1000   PT Stop Time 1100   PT Time Calculation (min) 60 min   Equipment Utilized During Treatment Gait belt   Activity Tolerance Patient tolerated treatment well   Behavior During Therapy [facility] for tasks assessed/performed      Past Medical History  Diagnosis Date  . GERD (gastroesophageal reflux disease)   . Anxiety   . Arthritis   . Depression   . Hyperlipidemia   . Parkinsonism     Past Surgical History  Procedure Laterality Date  . Knee arthroscopy Right 2010  . Knee arthroscopy Left 2012    There were no vitals filed for this visit.  Visit Diagnosis:  Difficulty balancing  Difficulty in walking      Subjective Assessment - 07/20/15 1010    Subjective Patient has some questions with sequencing of exericses from HEP.      Floor to ceiling x 10 reps, side to side 10 reps, step and reach forward x 10 reps, step and reach backwards x 10, step and reach sideways x 10 , Rock and reach forward/backward x 10 , Rock and reach sideways x 10, functional tasks; 1 sit to stand  Continues to have balance deficits typical with diagnosis. Patient performs beginning level exercises without pain behaviors and needs verbal cuing for postural alignment and head positioning CGA to SBA for safety with activities. Uses to increase intensity and amplitude of movements throughout session Motor control of LE much improved.  Muscle fatigue but no major pain complaints                           PT  Education - 07/20/15 1011    Education provided Yes   Person(s) Educated Patient   Methods Explanation   Comprehension Verbalized understanding             PT Long Term Goals - 07/10/15 1127    PT LONG TERM GOAL #1   Title Patient will be independent in home exercise program to improve strength/mobility for better functional independence with ADLs 08/18/15   PT LONG TERM GOAL #2   Title Patient (74 years old) will complete five times sit to stand test in < 10 seconds indicating an increased LE strength and improved balance. 08/18/15   PT LONG TERM GOAL #3   Title Patient will increase six minute walk test distance to >1000 for progression to community ambulator and improve gait ability 08/18/15   PT LONG TERM GOAL #4   Title Patient will increase 10 meter walk test to >1.6m/s as to improve gait speed for better community ambulation and to reduce fall 08/18/15               Plan - 07/20/15 1011    Clinical Impression Statement Miodcueing needed to appropriately perform LSVT tasks with leg, hand, and head position. Decreased coordination demonstrated requiring consistent verbal cueing to correct form. Cognitive understanding of task was delayed  Pt will benefit from skilled therapeutic intervention in order to improve on the following deficits Abnormal gait;Decreased activity tolerance;Decreased strength;Pain;Decreased balance;Decreased mobility;Difficulty walking;Decreased safety awareness   PT Frequency 4x / week   PT Duration 4 weeks   PT Treatment/Interventions Balance training;Therapeutic exercise;Therapeutic activities;Functional mobility training;Stair training        Problem List Patient Active Problem List   Diagnosis Date Noted  . Atrophic vaginitis 07/03/2015  . Urinary frequency 07/03/2015  . Urge incontinence 07/03/2015  . Anxiety and depression 07/08/2014  . BP (high blood pressure) 07/08/2014  . HLD (hyperlipidemia) 07/08/2014  . Idiopathic Parkinson's  disease 07/08/2014  . DDD (degenerative disc disease), lumbar 04/04/2014  . Neuritis or radiculitis due to rupture of lumbar intervertebral disc 04/04/2014    Alanson Puls 07/20/2015, 10:13 AM  [place] MAIN [facility] SERVICES [place], [place], [address] Phone: [phone]   Fax:  [phone]

## 2015-07-20 NOTE — Therapy (Signed)
[place] MAIN [facility] SERVICES [address] [place], [place], [address] Phone: [phone]   Fax:  [phone]  Speech Language Pathology Treatment  Patient Details  Name: Hannah Howard MRN: [id number] Date of Birth: 1941-05-06 Referring Provider:  Vladimir Crofts, MD  Encounter Date: 07/20/2015      End of Session - 07/20/15 1038    Visit Number 5   Number of Visits 17   Date for SLP Re-Evaluation 08/18/15   SLP Start Time 0900   SLP Stop Time  1000   SLP Time Calculation (min) 60 min   Activity Tolerance Patient tolerated treatment well      Past Medical History  Diagnosis Date  . GERD (gastroesophageal reflux disease)   . Anxiety   . Arthritis   . Depression   . Hyperlipidemia   . Parkinsonism     Past Surgical History  Procedure Laterality Date  . Knee arthroscopy Right 2010  . Knee arthroscopy Left 2012    There were no vitals filed for this visit.  Visit Diagnosis: Dysphonia      Subjective Assessment - 07/20/15 1035    Subjective The patient reports that she did not sleep well last night.   Currently in Pain? No/denies               ADULT SLP TREATMENT - 07/20/15 0001    General Information   Behavior/Cognition Alert;Cooperative;Pleasant mood   Treatment Provided   Treatment provided Cognitive-Linquistic   Pain Assessment   Pain Assessment No/denies pain   Cognitive-Linquistic Treatment   Treatment focused on Voice;Other (comment)  LSVT-LOUD   Skilled Treatment Daily Task #1: Average 9 seconds, 79 dB. Requires mod cues for quality.  Daily Task 2: Highs: 15 high pitched "ah" given mod cues. Lows: 15 low pitched "ah" given mod cues. Daily task #3: Average 75 dB.  Hierarchal speech loudness drill: Read sentences, 73 dB. Homework: assignments given.  Off the cuff remarks: average 63 dB.   Assessment / Recommendations / Plan   Plan Continue with current plan of care   Progression Toward Goals   Progression toward goals Progressing toward goals          SLP Education - 07/20/15 1035    Education provided Yes   Education Details LSVT-LOUD   Person(s) Educated Patient   Methods Explanation;Demonstration;Verbal cues;Handout   Comprehension Verbalized understanding;Returned demonstration;Need further instruction            SLP Long Term Goals - 07/10/15 1017    SLP LONG TERM GOAL #1   Title The patient will complete Daily Tasks (Maximum duration "ah", High/Lows, and Functional Phrases) at average loudness of 80 dB and with loud, good quality voice.    Time 4   Period Weeks   Status New   SLP LONG TERM GOAL #2   Title The patient will complete Hierarchal Speech Loudness reading drills (words/phrases, sentences, and paragraph) at average 75 dB and with loud, good quality voice.     Time 4   Period Weeks   Status New   SLP LONG TERM GOAL #3   Title The patient will complete homework daily.   Time 4   Period Weeks   Status New   SLP LONG TERM GOAL #4   Title The patient will participate in conversation, maintaining average loudness of 75 dB and loud, good quality voice.   Time 4   Period Weeks   Status New  Plan - 07/20/15 1039    Clinical Impression Statement The patient is completing daily tasks and hierarchal speech drill tasks with loud, good quality voice given mod-max SLP cues for quality.  The patient reports that she did not sleep well last night.   Speech Therapy Frequency 4x / week   Duration 4 weeks   Treatment/Interventions Other (comment)  LSVT-LOUD   Potential to Achieve Goals Good   Potential Considerations Ability to learn/carryover information;Cooperation/participation level;Medical prognosis;Previous level of function;Severity of impairments;Family/community support;Other (comment)   SLP Home Exercise Plan LSVT-LOUD daily homework   Consulted and Agree with Plan of Care Patient        Problem List Patient Active Problem List    Diagnosis Date Noted  . Atrophic vaginitis 07/03/2015  . Urinary frequency 07/03/2015  . Urge incontinence 07/03/2015  . Anxiety and depression 07/08/2014  . BP (high blood pressure) 07/08/2014  . HLD (hyperlipidemia) 07/08/2014  . Idiopathic Parkinson's disease 07/08/2014  . DDD (degenerative disc disease), lumbar 04/04/2014  . Neuritis or radiculitis due to rupture of lumbar intervertebral disc 04/04/2014   Leroy Sea, MS/CCC- SLP  Lou Miner 07/20/2015, 10:41 AM  [place] MAIN [facility] SERVICES [place], [place], [address] Phone: [phone]   Fax:  [phone]

## 2015-07-24 ENCOUNTER — Ambulatory Visit: Payer: Medicare Other | Admitting: Speech Pathology

## 2015-07-24 ENCOUNTER — Encounter: Payer: Self-pay | Admitting: Speech Pathology

## 2015-07-24 ENCOUNTER — Ambulatory Visit: Payer: Medicare Other | Admitting: Physical Therapy

## 2015-07-24 ENCOUNTER — Encounter: Payer: Self-pay | Admitting: Physical Therapy

## 2015-07-24 DIAGNOSIS — R490 Dysphonia: Secondary | ICD-10-CM

## 2015-07-24 DIAGNOSIS — R29818 Other symptoms and signs involving the nervous system: Secondary | ICD-10-CM

## 2015-07-24 DIAGNOSIS — R262 Difficulty in walking, not elsewhere classified: Secondary | ICD-10-CM

## 2015-07-24 NOTE — Therapy (Signed)
[place] MAIN [facility] SERVICES [address] [place], [place], [address] Phone: [phone]   Fax:  [phone]  Speech Language Pathology Treatment  Patient Details  Name: Hannah Howard MRN: [id number] Date of Birth: 1941/02/08 Referring Provider:  Vladimir Crofts, MD  Encounter Date: 07/24/2015      End of Session - 07/24/15 1017    Visit Number 6   Number of Visits 17   Date for SLP Re-Evaluation 08/18/15   SLP Start Time 0900   SLP Stop Time  0957   SLP Time Calculation (min) 57 min   Activity Tolerance Patient tolerated treatment well      Past Medical History  Diagnosis Date  . GERD (gastroesophageal reflux disease)   . Anxiety   . Arthritis   . Depression   . Hyperlipidemia   . Parkinsonism     Past Surgical History  Procedure Laterality Date  . Knee arthroscopy Right 2010  . Knee arthroscopy Left 2012    There were no vitals filed for this visit.  Visit Diagnosis: Dysphonia      Subjective Assessment - 07/24/15 1015    Subjective The patient reports that she feels rested this morning.   Currently in Pain? No/denies               ADULT SLP TREATMENT - 07/24/15 0001    General Information   Behavior/Cognition Alert;Cooperative;Pleasant mood   Treatment Provided   Treatment provided Cognitive-Linquistic   Pain Assessment   Pain Assessment No/denies pain   Cognitive-Linquistic Treatment   Treatment focused on Voice;Other (comment)  LSVT-LOUD   Skilled Treatment Daily Task #1: Average 9 seconds, 79 dB. Requires mod cues for quality.  Daily Task 2: Highs: 15 high pitched "ah" given min-mod cues. Lows: 15 low pitched "ah" given min-mod cues. Daily task #3: Average 75 dB.  Hierarchal speech loudness drill: Read paragraphs, dB. Homework: assignments completed.  Off the cuff remarks: average 63 dB.   Assessment / Recommendations / Plan   Plan Continue with current plan of care   Progression Toward Goals   Progression toward goals Progressing toward goals          SLP Education - 07/24/15 1016    Education provided Yes   Education Details LSVT-LOUD   Person(s) Educated Patient   Methods Explanation;Demonstration;Verbal cues;Handout   Comprehension Verbalized understanding;Returned demonstration;Need further instruction            SLP Long Term Goals - 07/10/15 1017    SLP LONG TERM GOAL #1   Title The patient will complete Daily Tasks (Maximum duration "ah", High/Lows, and Functional Phrases) at average loudness of 80 dB and with loud, good quality voice.    Time 4   Period Weeks   Status New   SLP LONG TERM GOAL #2   Title The patient will complete Hierarchal Speech Loudness reading drills (words/phrases, sentences, and paragraph) at average 75 dB and with loud, good quality voice.     Time 4   Period Weeks   Status New   SLP LONG TERM GOAL #3   Title The patient will complete homework daily.   Time 4   Period Weeks   Status New   SLP LONG TERM GOAL #4   Title The patient will participate in conversation, maintaining average loudness of 75 dB and loud, good quality voice.   Time 4   Period Weeks   Status New  Plan - 07/24/15 1017    Clinical Impression Statement The patient is completing daily tasks and hierarchal speech drill tasks with loud, good quality voice given mod SLP cues for quality.     Speech Therapy Frequency 4x / week   Duration 4 weeks   Treatment/Interventions Other (comment)  The patient is completing daily tasks and hierarchal speech drill tasks with loud, good quality voice given mod SLP cues for quality.  -   Potential to Achieve Goals Good   Potential Considerations Ability to learn/carryover information;Cooperation/participation level;Medical prognosis;Previous level of function;Severity of impairments;Family/community support;Other (comment)   SLP Home Exercise Plan LSVT-LOUD daily homework   Consulted and Agree with Plan of Care  Patient        Problem List Patient Active Problem List   Diagnosis Date Noted  . Atrophic vaginitis 07/03/2015  . Urinary frequency 07/03/2015  . Urge incontinence 07/03/2015  . Anxiety and depression 07/08/2014  . BP (high blood pressure) 07/08/2014  . HLD (hyperlipidemia) 07/08/2014  . Idiopathic Parkinson's disease 07/08/2014  . DDD (degenerative disc disease), lumbar 04/04/2014  . Neuritis or radiculitis due to rupture of lumbar intervertebral disc 04/04/2014   Leroy Sea, MS/CCC- SLP  Lou Miner 07/24/2015, 10:19 AM  [place] MAIN [facility] SERVICES [place], [place], [address] Phone: [phone]   Fax:  [phone]

## 2015-07-24 NOTE — Therapy (Signed)
[place] MAIN [facility] SERVICES [address] [place], [place], [address] Phone: [phone]   Fax:  [phone]  Physical Therapy Treatment  Patient Details  Name: Hannah Howard MRN: [id number] Date of Birth: 02/19/1941 Referring Provider:  Vladimir Crofts, MD  Encounter Date: 07/24/2015      PT End of Session - 07/24/15 1008    Visit Number 6   Number of Visits 17   Date for PT Re-Evaluation 08/07/15   PT Start Time 1000   PT Stop Time 1100   PT Time Calculation (min) 60 min   Activity Tolerance Patient tolerated treatment well      Past Medical History  Diagnosis Date  . GERD (gastroesophageal reflux disease)   . Anxiety   . Arthritis   . Depression   . Hyperlipidemia   . Parkinsonism     Past Surgical History  Procedure Laterality Date  . Knee arthroscopy Right 2010  . Knee arthroscopy Left 2012    There were no vitals filed for this visit.  Visit Diagnosis:  Difficulty balancing  Difficulty in walking      Subjective Assessment - 07/24/15 1004    Subjective Patient is doing fine today         Floor to ceiling x 10 reps, side to side 10 reps, step and reach forward x 10 reps, step and reach backwards x 10, step and reach sideways x 10 , Rock and reach forward/backward x 10 , Rock and reach sideways x 10, functional tasks; 1 sit to stand 2. Scooting up and down the table to get into a booth 3. Picking up feet to get into a car 4. Reaching activities to assist with putting dishes in a cabinet 5. Carrying objects with 2 hands . Continues to have balance deficits typical with diagnosis. Patient performs beginning level exercises without pain behaviors and needs verbal cuing for postural alignment and head positioning CGA to SBA for safety with activities. Uses to increase intensity and amplitude of movements throughout session                          PT Education - 07/24/15 1006    Education  provided Yes   Education Details LSVT BIG   Person(s) Educated Patient   Methods Explanation   Comprehension Verbalized understanding             PT Long Term Goals - 07/10/15 1127    PT LONG TERM GOAL #1   Title Patient will be independent in home exercise program to improve strength/mobility for better functional independence with ADLs 08/18/15   PT LONG TERM GOAL #2   Title Patient (< 26 years old) will complete five times sit to stand test in < 10 seconds indicating an increased LE strength and improved balance. 08/18/15   PT LONG TERM GOAL #3   Title Patient will increase six minute walk test distance to >1000 for progression to community ambulator and improve gait ability 08/18/15   PT LONG TERM GOAL #4   Title Patient will increase 10 meter walk test to >1.6m/s as to improve gait speed for better community ambulation and to reduce fall 08/18/15               Problem List Patient Active Problem List   Diagnosis Date Noted  . Atrophic vaginitis 07/03/2015  . Urinary frequency 07/03/2015  . Urge incontinence 07/03/2015  . Anxiety and depression  07/08/2014  . BP (high blood pressure) 07/08/2014  . HLD (hyperlipidemia) 07/08/2014  . Idiopathic Parkinson's disease 07/08/2014  . DDD (degenerative disc disease), lumbar 04/04/2014  . Neuritis or radiculitis due to rupture of lumbar intervertebral disc 04/04/2014    Alanson Puls 07/24/2015, 10:10 AM  [place] MAIN [facility] SERVICES [place], [place], [address] Phone: [phone]   Fax:  [phone]

## 2015-07-25 ENCOUNTER — Ambulatory Visit: Payer: Medicare Other | Admitting: Physical Therapy

## 2015-07-25 ENCOUNTER — Ambulatory Visit: Payer: Medicare Other | Admitting: Speech Pathology

## 2015-07-25 ENCOUNTER — Encounter: Payer: Self-pay | Admitting: Speech Pathology

## 2015-07-25 ENCOUNTER — Encounter: Payer: Self-pay | Admitting: Physical Therapy

## 2015-07-25 DIAGNOSIS — R262 Difficulty in walking, not elsewhere classified: Secondary | ICD-10-CM

## 2015-07-25 DIAGNOSIS — R29818 Other symptoms and signs involving the nervous system: Secondary | ICD-10-CM

## 2015-07-25 DIAGNOSIS — R490 Dysphonia: Secondary | ICD-10-CM | POA: Diagnosis not present

## 2015-07-25 NOTE — Therapy (Signed)
[place] MAIN [facility] SERVICES [address] [place], [place], [address] Phone: [phone]   Fax:  [phone]  Physical Therapy Treatment  Patient Details  Name: Hannah Howard MRN: [id number] Date of Birth: Apr 18, 1941 Referring Provider:  Vladimir Crofts, MD  Encounter Date: 07/25/2015      PT End of Session - 07/25/15 1005    Visit Number 7   Number of Visits 17   Date for PT Re-Evaluation 08/07/15   PT Start Time 1000   PT Stop Time 1100   PT Time Calculation (min) 60 min   Activity Tolerance Patient tolerated treatment well      Past Medical History  Diagnosis Date  . GERD (gastroesophageal reflux disease)   . Anxiety   . Arthritis   . Depression   . Hyperlipidemia   . Parkinsonism     Past Surgical History  Procedure Laterality Date  . Knee arthroscopy Right 2010  . Knee arthroscopy Left 2012    There were no vitals filed for this visit.  Visit Diagnosis:  Difficulty balancing  Difficulty in walking      Subjective Assessment - 07/25/15 1003    Subjective Patient says that she did her exercises yesterday.       Floor to ceiling x 10 reps, side to side 10 reps, step and reach forward x 10 reps, step and reach backwards x 10, step and reach sideways x 10 , Rock and reach forward/backward x 10 , Rock and reach sideways x 10, functional tasks; 1 sit to stand 2. Scooting up and down the table to get into a booth 3. Picking up feet to get into a car 4. Reaching activities to assist with putting dishes in a cabinet 5. Carrying objects with 2 hands . Continues to have balance deficits typical with diagnosis. Patient performs beginning level exercises without pain behaviors and needs verbal cuing for postural alignment and head positioning CGA to SBA for safety with activities.Needs cuing to remember to turn her head with side stepping. Patient has decreased motor control with backwards stepping.                             PT Education - 07/25/15 1004    Education provided Yes   Education Details LSVT BIG   Person(s) Educated Patient   Methods Explanation             PT Long Term Goals - 07/10/15 1127    PT LONG TERM GOAL #1   Title Patient will be independent in home exercise program to improve strength/mobility for better functional independence with ADLs 08/18/15   PT LONG TERM GOAL #2   Title Patient (< 73 years old) will complete five times sit to stand test in < 10 seconds indicating an increased LE strength and improved balance. 08/18/15   PT LONG TERM GOAL #3   Title Patient will increase six minute walk test distance to >1000 for progression to community ambulator and improve gait ability 08/18/15   PT LONG TERM GOAL #4   Title Patient will increase 10 meter walk test to >1.91m/s as to improve gait speed for better community ambulation and to reduce fall 08/18/15               Plan - 07/25/15 1006    Clinical Impression Statement Min cueing needed to appropriately perform LSVT tasks with leg, hand, and head position. Decreased  coordination demonstrated requiring consistent verbal cueing to correct form. Cognitive understanding of task was delayed.   Pt will benefit from skilled therapeutic intervention in order to improve on the following deficits Abnormal gait;Decreased activity tolerance;Decreased strength;Pain;Decreased balance;Decreased mobility;Difficulty walking;Decreased safety awareness   Rehab Potential Good   PT Frequency 4x / week   PT Duration 4 weeks   PT Treatment/Interventions Balance training;Therapeutic exercise;Therapeutic activities;Functional mobility training;Stair training   PT Next Visit Plan LSVT BIG   Consulted and Agree with Plan of Care Patient        Problem List Patient Active Problem List   Diagnosis Date Noted  . Atrophic vaginitis 07/03/2015  . Urinary frequency 07/03/2015  . Urge incontinence  07/03/2015  . Anxiety and depression 07/08/2014  . BP (high blood pressure) 07/08/2014  . HLD (hyperlipidemia) 07/08/2014  . Idiopathic Parkinson's disease 07/08/2014  . DDD (degenerative disc disease), lumbar 04/04/2014  . Neuritis or radiculitis due to rupture of lumbar intervertebral disc 04/04/2014    Alanson Puls 07/25/2015, 10:14 AM  [place] MAIN [facility] SERVICES [address] [place], [place], [address] Phone: [phone]   Fax:  [phone]

## 2015-07-25 NOTE — Therapy (Signed)
[place] MAIN [facility] SERVICES [address] [place], [place], [address] Phone: [phone]   Fax:  [phone]  Speech Language Pathology Treatment  Patient Details  Name: Hannah Howard MRN: [id number] Date of Birth: [birth year] Referring Provider:  Vladimir Crofts, MD  Encounter Date: 07/25/2015      End of Session - 07/25/15 1202    Visit Number 7   Number of Visits 17   Date for SLP Re-Evaluation 08/18/15   SLP Start Time 0903   SLP Stop Time  0957   SLP Time Calculation (min) 54 min   Activity Tolerance Patient tolerated treatment well      Past Medical History  Diagnosis Date  . GERD (gastroesophageal reflux disease)   . Anxiety   . Arthritis   . Depression   . Hyperlipidemia   . Parkinsonism     Past Surgical History  Procedure Laterality Date  . Knee arthroscopy Right 2010  . Knee arthroscopy Left 2012    There were no vitals filed for this visit.  Visit Diagnosis: Dysphonia      Subjective Assessment - 07/25/15 1202    Subjective The patient reports that she feels rested this morning.   Currently in Pain? No/denies               ADULT SLP TREATMENT - 07/25/15 0001    General Information   Behavior/Cognition Alert;Cooperative;Pleasant mood   Treatment Provided   Treatment provided Cognitive-Linquistic   Pain Assessment   Pain Assessment No/denies pain   Cognitive-Linquistic Treatment   Treatment focused on Voice;Other (comment)  LSVT-LOUD   Skilled Treatment Daily Task #1: Average 9 seconds, 79 dB. Requires mod cues for quality.  Daily Task 2: Highs: 15 high pitched "ah" given min-mod cues. Lows: 15 low pitched "ah" given min-mod cues. Daily task #3: Average 75 dB.  Hierarchal speech loudness drill: Read paragraphs, 72 dB. Homework: assignments completed.  Off the cuff remarks: average 69 dB.   Assessment / Recommendations / Plan   Plan Continue with current plan of care   Progression Toward Goals    Progression toward goals Progressing toward goals          SLP Education - 07/25/15 1202    Education provided Yes   Education Details LSVT-LOUD   Person(s) Educated Patient   Methods Explanation;Demonstration;Verbal cues;Handout   Comprehension Verbalized understanding;Returned demonstration;Verbal cues required;Need further instruction            SLP Long Term Goals - 07/10/15 1017    SLP LONG TERM GOAL #1   Title The patient will complete Daily Tasks (Maximum duration "ah", High/Lows, and Functional Phrases) at average loudness of 80 dB and with loud, good quality voice.    Time 4   Period Weeks   Status New   SLP LONG TERM GOAL #2   Title The patient will complete Hierarchal Speech Loudness reading drills (words/phrases, sentences, and paragraph) at average 75 dB and with loud, good quality voice.     Time 4   Period Weeks   Status New   SLP LONG TERM GOAL #3   Title The patient will complete homework daily.   Time 4   Period Weeks   Status New   SLP LONG TERM GOAL #4   Title The patient will participate in conversation, maintaining average loudness of 75 dB and loud, good quality voice.   Time 4   Period Weeks   Status New  Plan - 07/25/15 1202    Clinical Impression Statement The patient is completing daily tasks and hierarchal speech drill tasks with loud, good quality voice given fewer SLP cues for quality.  She is demonstrating emerging generalization into conversational speech.   Speech Therapy Frequency 4x / week   Duration 4 weeks   Treatment/Interventions Other (comment)  LSVT-LOUD   Potential to Achieve Goals Good   Potential Considerations Ability to learn/carryover information;Cooperation/participation level;Medical prognosis;Previous level of function;Severity of impairments;Family/community support;Other (comment)   SLP Home Exercise Plan LSVT-LOUD daily homework   Consulted and Agree with Plan of Care Patient        Problem  List Patient Active Problem List   Diagnosis Date Noted  . Atrophic vaginitis 07/03/2015  . Urinary frequency 07/03/2015  . Urge incontinence 07/03/2015  . Anxiety and depression 07/08/2014  . BP (high blood pressure) 07/08/2014  . HLD (hyperlipidemia) 07/08/2014  . Idiopathic Parkinson's disease 07/08/2014  . DDD (degenerative disc disease), lumbar 04/04/2014  . Neuritis or radiculitis due to rupture of lumbar intervertebral disc 04/04/2014   Leroy Sea, MS/CCC- SLP  Lou Miner 07/25/2015, 12:04 PM  [place] MAIN [facility] SERVICES [address] [place], [place], [address] Phone: [phone]   Fax:  [phone]

## 2015-07-26 ENCOUNTER — Encounter: Payer: Self-pay | Admitting: Speech Pathology

## 2015-07-26 ENCOUNTER — Encounter: Payer: Self-pay | Admitting: Physical Therapy

## 2015-07-26 ENCOUNTER — Ambulatory Visit: Payer: Medicare Other | Admitting: Physical Therapy

## 2015-07-26 ENCOUNTER — Ambulatory Visit: Payer: Medicare Other | Admitting: Speech Pathology

## 2015-07-26 DIAGNOSIS — R490 Dysphonia: Secondary | ICD-10-CM | POA: Diagnosis not present

## 2015-07-26 DIAGNOSIS — R262 Difficulty in walking, not elsewhere classified: Secondary | ICD-10-CM

## 2015-07-26 DIAGNOSIS — R29818 Other symptoms and signs involving the nervous system: Secondary | ICD-10-CM

## 2015-07-26 NOTE — Therapy (Signed)
[place] MAIN [facility] SERVICES [address] [place], [place], [address] Phone: [phone]   Fax:  [phone]  Speech Language Pathology Treatment  Patient Details  Name: Hannah Howard MRN: [id number] Date of Birth: [birth year] Referring Provider:  Vladimir Crofts, MD  Encounter Date: 07/26/2015      End of Session - 07/26/15 1001    Visit Number 8   Number of Visits 17   Date for SLP Re-Evaluation 08/18/15   SLP Start Time 0903   SLP Stop Time  0956   SLP Time Calculation (min) 53 min   Activity Tolerance Patient tolerated treatment well      Past Medical History  Diagnosis Date  . GERD (gastroesophageal reflux disease)   . Anxiety   . Arthritis   . Depression   . Hyperlipidemia   . Parkinsonism     Past Surgical History  Procedure Laterality Date  . Knee arthroscopy Right 2010  . Knee arthroscopy Left 2012    There were no vitals filed for this visit.  Visit Diagnosis: Dysphonia      Subjective Assessment - 07/26/15 0958    Subjective The patient reports that she feels rested this morning.   Currently in Pain? No/denies               ADULT SLP TREATMENT - 07/26/15 0001    General Information   Behavior/Cognition Alert;Cooperative;Pleasant mood   Treatment Provided   Treatment provided Cognitive-Linquistic   Pain Assessment   Pain Assessment No/denies pain   Cognitive-Linquistic Treatment   Treatment focused on Voice;Other (comment)  LSVT-LOUD   Skilled Treatment Daily Task #1: Average 9 seconds, 80 dB. Requires min-mod cues for quality.  Daily Task 2: Highs: 15 high pitched "ah" given min-mod cues. Lows: 15 low pitched "ah" given min-mod cues. Daily task #3: Average 75 dB.  Hierarchal speech loudness drill: Read paragraphs, 72 dB. Homework: assignments completed.  Off the cuff remarks: average 69 dB.   Assessment / Recommendations / Plan   Plan Continue with current plan of care   Progression Toward  Goals   Progression toward goals Progressing toward goals          SLP Education - 07/26/15 0959    Education provided Yes   Education Details LSVT-LOUD   Person(s) Educated Patient   Methods Explanation;Demonstration;Verbal cues;Handout   Comprehension Verbalized understanding;Returned demonstration;Verbal cues required;Need further instruction            SLP Long Term Goals - 07/10/15 1017    SLP LONG TERM GOAL #1   Title The patient will complete Daily Tasks (Maximum duration "ah", High/Lows, and Functional Phrases) at average loudness of 80 dB and with loud, good quality voice.    Time 4   Period Weeks   Status New   SLP LONG TERM GOAL #2   Title The patient will complete Hierarchal Speech Loudness reading drills (words/phrases, sentences, and paragraph) at average 75 dB and with loud, good quality voice.     Time 4   Period Weeks   Status New   SLP LONG TERM GOAL #3   Title The patient will complete homework daily.   Time 4   Period Weeks   Status New   SLP LONG TERM GOAL #4   Title The patient will participate in conversation, maintaining average loudness of 75 dB and loud, good quality voice.   Time 4   Period Weeks   Status New  Plan - 07/26/15 1002    Clinical Impression Statement The patient is completing daily tasks and hierarchal speech drill tasks with loud, good quality voice given fewer SLP cues for quality.  She is demonstrating emerging generalization into conversational speech.   Speech Therapy Frequency 4x / week   Duration 4 weeks   Treatment/Interventions Other (comment)  LSVT-LOUD   Potential to Achieve Goals Good   Potential Considerations Ability to learn/carryover information;Cooperation/participation level;Medical prognosis;Previous level of function;Severity of impairments;Family/community support;Other (comment)   SLP Home Exercise Plan LSVT-LOUD daily homework   Consulted and Agree with Plan of Care Patient         Problem List Patient Active Problem List   Diagnosis Date Noted  . Atrophic vaginitis 07/03/2015  . Urinary frequency 07/03/2015  . Urge incontinence 07/03/2015  . Anxiety and depression 07/08/2014  . BP (high blood pressure) 07/08/2014  . HLD (hyperlipidemia) 07/08/2014  . Idiopathic Parkinson's disease 07/08/2014  . DDD (degenerative disc disease), lumbar 04/04/2014  . Neuritis or radiculitis due to rupture of lumbar intervertebral disc 04/04/2014   Leroy Sea, MS/CCC- SLP  Lou Miner 07/26/2015, 10:03 AM  [place] MAIN [facility] SERVICES [address] [place], [place], [address] Phone: [phone]   Fax:  [phone]

## 2015-07-26 NOTE — Therapy (Signed)
[place] MAIN [facility] SERVICES [address] [place], [place], [address] Phone: [phone]   Fax:  [phone]  Physical Therapy Treatment  Patient Details  Name: Hannah Howard MRN: [id number] Date of Birth: 03-16-1941 Referring Provider:  Vladimir Crofts, MD  Encounter Date: 07/26/2015      PT End of Session - 07/26/15 1016    Visit Number 8   Number of Visits 17   Date for PT Re-Evaluation 08/07/15   PT Start Time 1000   PT Stop Time 1100   PT Time Calculation (min) 60 min   Equipment Utilized During Treatment Gait belt   Activity Tolerance Patient tolerated treatment well      Past Medical History  Diagnosis Date  . GERD (gastroesophageal reflux disease)   . Anxiety   . Arthritis   . Depression   . Hyperlipidemia   . Parkinsonism     Past Surgical History  Procedure Laterality Date  . Knee arthroscopy Right 2010  . Knee arthroscopy Left 2012    There were no vitals filed for this visit.  Visit Diagnosis:  Difficulty balancing  Difficulty in walking      Subjective Assessment - 07/26/15 1007    Subjective Patient has been doing her exercises at home.    Currently in Pain? No/denies         Floor to ceiling x 10 reps, side to side 10 reps, step and reach forward x 10 reps, step and reach backwards x 10, step and reach sideways x 10 , Rock and reach forward/backward x 10 , Rock and reach sideways x 10, functional tasks; 1 sit to stand 2. Scooting up and down the table to get into a booth 3. Picking up feet to get into a car 4. Reaching activities to assist with putting dishes in a cabinet 5. Carrying objects with 2 hands . Continues to have balance deficits typical with diagnosis. Patient performs beginning level exercises without pain behaviors and needs verbal cuing for postural alignment and head positioning CGA to SBA for safety with activities. Uses to increase intensity and amplitude of movements throughout  session  Loss of balance often to the R side.                          PT Education - 07/26/15 1014    Education provided Yes   Education Details LSVT BIG   Person(s) Educated Patient   Methods Explanation   Comprehension Verbalized understanding             PT Long Term Goals - 07/10/15 1127    PT LONG TERM GOAL #1   Title Patient will be independent in home exercise program to improve strength/mobility for better functional independence with ADLs 08/18/15   PT LONG TERM GOAL #2   Title Patient (< 10 years old) will complete five times sit to stand test in < 10 seconds indicating an increased LE strength and improved balance. 08/18/15   PT LONG TERM GOAL #3   Title Patient will increase six minute walk test distance to >1000 for progression to community ambulator and improve gait ability 08/18/15   PT LONG TERM GOAL #4   Title Patient will increase 10 meter walk test to >1.40m/s as to improve gait speed for better community ambulation and to reduce fall 08/18/15               Plan - 07/26/15 1017  Clinical Impression Statement  Continues to have balance deficits typical with diagnosis.    Pt will benefit from skilled therapeutic intervention in order to improve on the following deficits Abnormal gait;Decreased activity tolerance;Decreased strength;Pain;Decreased balance;Decreased mobility;Difficulty walking;Decreased safety awareness   Rehab Potential Good   PT Frequency 4x / week   PT Duration 4 weeks   PT Treatment/Interventions Balance training;Therapeutic exercise;Therapeutic activities;Functional mobility training;Stair training   PT Next Visit Plan LSVT BIG   Consulted and Agree with Plan of Care Patient        Problem List Patient Active Problem List   Diagnosis Date Noted  . Atrophic vaginitis 07/03/2015  . Urinary frequency 07/03/2015  . Urge incontinence 07/03/2015  . Anxiety and depression 07/08/2014  . BP (high blood pressure)  07/08/2014  . HLD (hyperlipidemia) 07/08/2014  . Idiopathic Parkinson's disease 07/08/2014  . DDD (degenerative disc disease), lumbar 04/04/2014  . Neuritis or radiculitis due to rupture of lumbar intervertebral disc 04/04/2014    Alanson Puls 07/26/2015, 10:19 AM  [place] MAIN [facility] SERVICES [place], [place], [address] Phone: [phone]   Fax:  [phone]

## 2015-07-27 ENCOUNTER — Ambulatory Visit: Payer: Medicare Other | Admitting: Physical Therapy

## 2015-07-27 ENCOUNTER — Encounter: Payer: Self-pay | Admitting: Physical Therapy

## 2015-07-27 ENCOUNTER — Ambulatory Visit: Payer: Medicare Other | Attending: Neurology | Admitting: Speech Pathology

## 2015-07-27 ENCOUNTER — Encounter: Payer: Self-pay | Admitting: Speech Pathology

## 2015-07-27 DIAGNOSIS — R29818 Other symptoms and signs involving the nervous system: Secondary | ICD-10-CM | POA: Insufficient documentation

## 2015-07-27 DIAGNOSIS — R262 Difficulty in walking, not elsewhere classified: Secondary | ICD-10-CM | POA: Diagnosis present

## 2015-07-27 DIAGNOSIS — R490 Dysphonia: Secondary | ICD-10-CM | POA: Insufficient documentation

## 2015-07-27 NOTE — Therapy (Signed)
[place] MAIN [facility] SERVICES [address] [place], [place], [address] Phone: [phone]   Fax:  [phone]  Physical Therapy Treatment  Patient Details  Name: Hannah Howard MRN: [id number] Date of Birth: January 04, 1941 Referring Provider:  Vladimir Crofts, MD  Encounter Date: 07/27/2015      PT End of Session - 07/27/15 1018    Visit Number 9   Number of Visits 17   Date for PT Re-Evaluation 08/07/15   PT Start Time 1000   PT Stop Time 1100   PT Time Calculation (min) 60 min   Equipment Utilized During Treatment Gait belt   Activity Tolerance Patient tolerated treatment well   Behavior During Therapy [facility] for tasks assessed/performed      Past Medical History  Diagnosis Date  . GERD (gastroesophageal reflux disease)   . Anxiety   . Arthritis   . Depression   . Hyperlipidemia   . Parkinsonism     Past Surgical History  Procedure Laterality Date  . Knee arthroscopy Right 2010  . Knee arthroscopy Left 2012    There were no vitals filed for this visit.  Visit Diagnosis:  Difficulty balancing  Difficulty in walking      Subjective Assessment - 07/27/15 1017    Subjective Patient is doing her HEP.           Floor to ceiling x 10 reps, side to side 10 reps, step and reach forward x 10 reps, step and reach backwards x 10, step and reach sideways x 10 , Rock and reach forward/backward x 10 , Rock and reach sideways x 10, functional tasks; 1 sit to stand 2. Scooting up and down the table to get into a booth 3. Picking up feet to get into a car 4. Reaching activities to assist with putting dishes in a cabinet 5. Carrying objects with 2 hands .Patient has fatigue with standing exercises and needs constant VC to have correct posturePatient has loss of balance and needs UE support intermittently thorough out exercise. Patient has slowness of movement during rotation and beginning movements                         PT Education - 07/27/15 1017    Education provided Yes   Education Details LSVT BIG   Person(s) Educated Patient   Methods Explanation   Comprehension Verbalized understanding             PT Long Term Goals - 07/10/15 1127    PT LONG TERM GOAL #1   Title Patient will be independent in home exercise program to improve strength/mobility for better functional independence with ADLs 08/18/15   PT LONG TERM GOAL #2   Title Patient (74 years old) will complete five times sit to stand test in < 10 seconds indicating an increased LE strength and improved balance. 08/18/15   PT LONG TERM GOAL #3   Title Patient will increase six minute walk test distance to >1000 for progression to community ambulator and improve gait ability 08/18/15   PT LONG TERM GOAL #4   Title Patient will increase 10 meter walk test to >1.70m/s as to improve gait speed for better community ambulation and to reduce fall 08/18/15               Plan - 07/27/15 1018    Clinical Impression Statement  Patient is able to catch herself with incorrect positions and is  able remember the start and finish positions. Patient has better stepping pattern with less stopping and better posture   Pt will benefit from skilled therapeutic intervention in order to improve on the following deficits Abnormal gait;Decreased activity tolerance;Decreased strength;Pain;Decreased balance;Decreased mobility;Difficulty walking;Decreased safety awareness   Rehab Potential Good   PT Frequency 4x / week   PT Treatment/Interventions Balance training;Therapeutic exercise;Therapeutic activities;Functional mobility training;Stair training   PT Next Visit Plan LSVT BIG   Consulted and Agree with Plan of Care Patient        Problem List Patient Active Problem List   Diagnosis Date Noted  . Atrophic vaginitis 07/03/2015  . Urinary frequency 07/03/2015  . Urge incontinence 07/03/2015  . Anxiety and depression 07/08/2014  . BP (high blood  pressure) 07/08/2014  . HLD (hyperlipidemia) 07/08/2014  . Idiopathic Parkinson's disease 07/08/2014  . DDD (degenerative disc disease), lumbar 04/04/2014  . Neuritis or radiculitis due to rupture of lumbar intervertebral disc 04/04/2014    Alanson Puls 07/27/2015, 10:22 AM  [place] MAIN [facility] SERVICES [address] [place], [place], [address] Phone: [phone]   Fax:  [phone]

## 2015-07-27 NOTE — Therapy (Signed)
[place] MAIN [facility] SERVICES [address] [place], [place], [address] Phone: [phone]   Fax:  [phone]  Speech Language Pathology Treatment  Patient Details  Name: Hannah Howard MRN: [id number] Date of Birth: 1941-04-27 Referring Provider:  Vladimir Crofts, MD  Encounter Date: 07/27/2015      End of Session - 07/27/15 1002    Visit Number 9   Number of Visits 17   Date for SLP Re-Evaluation 08/18/15   SLP Start Time 0902   SLP Stop Time  0957   SLP Time Calculation (min) 55 min   Activity Tolerance Patient tolerated treatment well      Past Medical History  Diagnosis Date  . GERD (gastroesophageal reflux disease)   . Anxiety   . Arthritis   . Depression   . Hyperlipidemia   . Parkinsonism     Past Surgical History  Procedure Laterality Date  . Knee arthroscopy Right 2010  . Knee arthroscopy Left 2012    There were no vitals filed for this visit.  Visit Diagnosis: Dysphonia      Subjective Assessment - 07/27/15 1001    Subjective The patient reports that she is consciously using her LOUD voice with friends/family.   Currently in Pain? No/denies               ADULT SLP TREATMENT - 07/27/15 0001    General Information   Behavior/Cognition Alert;Cooperative;Pleasant mood   Treatment Provided   Treatment provided Dysphagia;Cognitive-Linquistic   Pain Assessment   Pain Assessment No/denies pain   Cognitive-Linquistic Treatment   Treatment focused on Voice;Other (comment)  LSVT-LOUD   Skilled Treatment Daily Task #1: Average 10 seconds, 81 dB. Requires min cues for quality.  Daily Task 2: Highs: 15 high pitched "ah" given min cues. Lows: 15 low pitched "ah" given min cues. Daily task #3: Average 75 dB.  Hierarchal speech loudness drill: Read paragraphs, 74 dB. Homework: assignments completed.  Off the cuff remarks: average 71 dB.   Assessment / Recommendations / Plan   Plan Continue with current plan of  care   Progression Toward Goals   Progression toward goals Progressing toward goals          SLP Education - 07/27/15 1002    Education provided Yes   Education Details LSVT-LOUD   Person(s) Educated Patient   Methods Explanation;Demonstration;Verbal cues;Handout   Comprehension Verbalized understanding;Returned demonstration;Need further instruction            SLP Long Term Goals - 07/10/15 1017    SLP LONG TERM GOAL #1   Title The patient will complete Daily Tasks (Maximum duration "ah", High/Lows, and Functional Phrases) at average loudness of 80 dB and with loud, good quality voice.    Time 4   Period Weeks   Status New   SLP LONG TERM GOAL #2   Title The patient will complete Hierarchal Speech Loudness reading drills (words/phrases, sentences, and paragraph) at average 75 dB and with loud, good quality voice.     Time 4   Period Weeks   Status New   SLP LONG TERM GOAL #3   Title The patient will complete homework daily.   Time 4   Period Weeks   Status New   SLP LONG TERM GOAL #4   Title The patient will participate in conversation, maintaining average loudness of 75 dB and loud, good quality voice.   Time 4   Period Weeks   Status New  Plan - 07/27/15 1003    Clinical Impression Statement The patient is completing daily tasks and hierarchal speech drill tasks with loud, good quality voice given fewer SLP cues for quality.  She is demonstrating emerging generalization into conversational speech.   Speech Therapy Frequency 4x / week   Duration 4 weeks   Treatment/Interventions Other (comment)  LSVT-LOUD   Potential to Achieve Goals Good   Potential Considerations Ability to learn/carryover information;Cooperation/participation level;Medical prognosis;Previous level of function;Severity of impairments;Family/community support;Other (comment)   SLP Home Exercise Plan LSVT-LOUD daily homework   Consulted and Agree with Plan of Care Patient         Problem List Patient Active Problem List   Diagnosis Date Noted  . Atrophic vaginitis 07/03/2015  . Urinary frequency 07/03/2015  . Urge incontinence 07/03/2015  . Anxiety and depression 07/08/2014  . BP (high blood pressure) 07/08/2014  . HLD (hyperlipidemia) 07/08/2014  . Idiopathic Parkinson's disease 07/08/2014  . DDD (degenerative disc disease), lumbar 04/04/2014  . Neuritis or radiculitis due to rupture of lumbar intervertebral disc 04/04/2014   Leroy Sea, MS/CCC- SLP  Lou Miner 07/27/2015, 10:04 AM  [place] MAIN [facility] SERVICES [place], [place], [address] Phone: [phone]   Fax:  [phone]

## 2015-08-01 ENCOUNTER — Encounter: Payer: Self-pay | Admitting: Physical Therapy

## 2015-08-01 ENCOUNTER — Encounter: Payer: Self-pay | Admitting: Speech Pathology

## 2015-08-01 ENCOUNTER — Ambulatory Visit: Payer: Medicare Other | Admitting: Speech Pathology

## 2015-08-01 ENCOUNTER — Ambulatory Visit: Payer: Medicare Other | Admitting: Physical Therapy

## 2015-08-01 DIAGNOSIS — R490 Dysphonia: Secondary | ICD-10-CM

## 2015-08-01 DIAGNOSIS — R262 Difficulty in walking, not elsewhere classified: Secondary | ICD-10-CM

## 2015-08-01 DIAGNOSIS — R29818 Other symptoms and signs involving the nervous system: Secondary | ICD-10-CM

## 2015-08-01 NOTE — Therapy (Signed)
[place] MAIN [facility] SERVICES [address] [place], [place], [address] Phone: [phone]   Fax:  [phone]  Physical Therapy Treatment  Patient Details  Name: Hannah Howard MRN: [id number] Date of Birth: 03/02/1941 Referring Provider:  Vladimir Crofts, MD  Encounter Date: 08-19-15      PT End of Session - 2015/08/19 1030    Visit Number 10   Number of Visits 17   Date for PT Re-Evaluation 08/07/15   PT Start Time 1000   PT Stop Time 1100   PT Time Calculation (min) 60 min   Equipment Utilized During Treatment Gait belt   Activity Tolerance Patient tolerated treatment well   Behavior During Therapy [facility] for tasks assessed/performed      Past Medical History  Diagnosis Date  . GERD (gastroesophageal reflux disease)   . Anxiety   . Arthritis   . Depression   . Hyperlipidemia   . Parkinsonism     Past Surgical History  Procedure Laterality Date  . Knee arthroscopy Right 2010  . Knee arthroscopy Left 2012    There were no vitals filed for this visit.  Visit Diagnosis:  Difficulty balancing  Difficulty in walking      Subjective Assessment - Aug 19, 2015 1029    Subjective Patient is doing her HEP.     outcome measures:  TUG 14.81s 10 MW = .77 m/sec  5 x sit to stand 1 min 0.09s (started getting up at 18s and used arms for last 2) 6 MW  645 feet.    Floor to ceiling x 10 reps, side to side 10 reps, step and reach forward x 10 reps, step and reach backwards x 10, step and reach sideways x 10 , Rock and reach forward/backward x 10 , Rock and reach sideways x 10, functional tasks; 1 sit to stand  Continues to have balance deficits typical with diagnosis. Patient performs beginning level exercises without pain behaviors and needs verbal cuing for postural alignment and head positioning CGA to SBA for safety with activities. Uses to increase intensity and amplitude of movements throughout  session                       PT Education - 08/19/2015 1030    Education provided Yes   Education Details LSVT BIG   Person(s) Educated Patient   Methods Explanation   Comprehension Verbalized understanding             PT Long Term Goals - 08-19-15 1032    PT LONG TERM GOAL #1   Status Partially Met   PT LONG TERM GOAL #2   Status Partially Met   PT LONG TERM GOAL #3   Status Partially Met   PT LONG TERM GOAL #4   Status Partially Met               Plan - 19-Aug-2015 1031    Clinical Impression Statement Continues to have balance deficits typical with diagnosis. Patient continues to demonstrate some in coordination of movement with select exercises such as rock and reach and stepping backwards. He responds well to verbal and tactile cues to correct form and technique.    Rehab Potential Good   PT Frequency 4x / week   PT Treatment/Interventions Balance training;Therapeutic exercise;Therapeutic activities;Functional mobility training;Stair training   PT Next Visit Plan LSVT BIG          G-Codes - 08-19-15 1032    Functional  Assessment Tool Used TUG, 10 MW, 6 MW, 5 x sit to stand   Functional Limitation Mobility: Walking and moving around   Mobility: Walking and Moving Around Goal Status [phone]) At least 20 percent but less than 40 percent impaired, limited or restricted      Problem List Patient Active Problem List   Diagnosis Date Noted  . Atrophic vaginitis 07/03/2015  . Urinary frequency 07/03/2015  . Urge incontinence 07/03/2015  . Anxiety and depression 07/08/2014  . BP (high blood pressure) 07/08/2014  . HLD (hyperlipidemia) 07/08/2014  . Idiopathic Parkinson's disease 07/08/2014  . DDD (degenerative disc disease), lumbar 04/04/2014  . Neuritis or radiculitis due to rupture of lumbar intervertebral disc 04/04/2014    Alanson Puls 08/01/2015, 11:16 AM  [place] MAIN [facility]  SERVICES [place], [place], [address] Phone: [phone]   Fax:  [phone]

## 2015-08-01 NOTE — Therapy (Signed)
[place] MAIN [facility] SERVICES [place], [place], [address] Phone: [phone]   Fax:  [phone]  Speech Language Pathology Progress Note  Patient Details  Name: Hannah Howard MRN: [id number] Date of Birth: [birth year] Referring Provider:  Vladimir Crofts, MD  Encounter Date: 08/01/2015      End of Session - 08/01/15 1050    Visit Number 10   Number of Visits 17   Date for SLP Re-Evaluation 08/18/15   SLP Start Time 0911   SLP Stop Time  0958   SLP Time Calculation (min) 47 min   Activity Tolerance Patient tolerated treatment well      Past Medical History  Diagnosis Date  . GERD (gastroesophageal reflux disease)   . Anxiety   . Arthritis   . Depression   . Hyperlipidemia   . Parkinsonism     Past Surgical History  Procedure Laterality Date  . Knee arthroscopy Right 2010  . Knee arthroscopy Left 2012    There were no vitals filed for this visit.  Visit Diagnosis: Dysphonia      Subjective Assessment - 08/01/15 1050    Subjective The patient reports that she is consciously using her LOUD voice with friends/family.   Currently in Pain? No/denies               ADULT SLP TREATMENT - 08/01/15 0001    General Information   Behavior/Cognition Alert;Cooperative;Pleasant mood   Treatment Provided   Treatment provided Cognitive-Linquistic   Pain Assessment   Pain Assessment No/denies pain   Cognitive-Linquistic Treatment   Treatment focused on Voice;Other (comment)  LSVT-LOUD   Skilled Treatment Daily Task #1: Average 10 seconds, 83 dB. Requires min cues for quality and duration.  Daily Task 2: Highs: 15 high pitched "ah" given min cues. Lows: 15 low pitched "ah" given min cues. Daily task #3: Average 75 dB.  Hierarchal speech loudness drill: Read paragraphs, 74 dB. Generate sentences to answer questions 73 dB.  Homework: assignments completed.  Off the cuff remarks: average 71 dB.   Assessment /  Recommendations / Plan   Plan Continue with current plan of care   Progression Toward Goals   Progression toward goals Progressing toward goals          SLP Education - 08/01/15 1050    Education provided Yes   Education Details LSVT-LOUD   Person(s) Educated Patient   Methods Explanation;Demonstration;Verbal cues;Handout   Comprehension Verbalized understanding;Returned demonstration;Verbal cues required;Need further instruction            SLP Long Term Goals - 08/01/15 1051    SLP LONG TERM GOAL #1   Title The patient will complete Daily Tasks (Maximum duration "ah", High/Lows, and Functional Phrases) at average loudness of 80 dB and with loud, good quality voice.    Time 4   Period Weeks   Status Partially Met   SLP LONG TERM GOAL #2   Title The patient will complete Hierarchal Speech Loudness reading drills (words/phrases, sentences, and paragraph) at average 75 dB and with loud, good quality voice.     Time 4   Period Weeks   Status Partially Met   SLP LONG TERM GOAL #3   Title The patient will complete homework daily.   Time 4   Period Weeks   Status On-going   SLP LONG TERM GOAL #4   Title The patient will participate in conversation, maintaining average loudness of 75 dB and loud, good  quality voice.   Time 4   Period Weeks   Status Partially Met          Plan - August 26, 2015 1051    Clinical Impression Statement The patient is completing daily tasks and hierarchal speech drill tasks with loud, good quality voice given fewer SLP cues for quality.  She is demonstrating emerging generalization into conversational speech.   Speech Therapy Frequency 4x / week   Duration 4 weeks   Treatment/Interventions Other (comment)  LSVT-LOUD   Potential to Achieve Goals Good   Potential Considerations Ability to learn/carryover information;Cooperation/participation level;Medical prognosis;Previous level of function;Severity of impairments;Family/community support;Other  (comment)   SLP Home Exercise Plan LSVT-LOUD daily homework   Consulted and Agree with Plan of Care Patient          G-Codes - August 26, 2015 1052    Functional Assessment Tool Used LSVT-LOUD protocol   Functional Limitations Voice   Voice Current Status (G9171) At least 20 percent but less than 40 percent impaired, limited or restricted   Voice Goal Status (R8614) At least 1 percent but less than 20 percent impaired, limited or restricted      Problem List Patient Active Problem List   Diagnosis Date Noted  . Atrophic vaginitis 07/03/2015  . Urinary frequency 07/03/2015  . Urge incontinence 07/03/2015  . Anxiety and depression 07/08/2014  . BP (high blood pressure) 07/08/2014  . HLD (hyperlipidemia) 07/08/2014  . Idiopathic Parkinson's disease 07/08/2014  . DDD (degenerative disc disease), lumbar 04/04/2014  . Neuritis or radiculitis due to rupture of lumbar intervertebral disc 04/04/2014   Leroy Sea, MS/CCC- SLP  Lou Miner 26-Aug-2015, 10:53 AM  [place] MAIN [facility] SERVICES [address] [place], [place], [address] Phone: [phone]   Fax:  [phone]

## 2015-08-02 ENCOUNTER — Ambulatory Visit: Payer: Medicare Other | Admitting: Physical Therapy

## 2015-08-02 ENCOUNTER — Encounter: Payer: Self-pay | Admitting: Physical Therapy

## 2015-08-02 ENCOUNTER — Ambulatory Visit: Payer: Medicare Other | Admitting: Speech Pathology

## 2015-08-02 ENCOUNTER — Encounter: Payer: Self-pay | Admitting: Speech Pathology

## 2015-08-02 DIAGNOSIS — R490 Dysphonia: Secondary | ICD-10-CM | POA: Diagnosis not present

## 2015-08-02 DIAGNOSIS — R262 Difficulty in walking, not elsewhere classified: Secondary | ICD-10-CM

## 2015-08-02 DIAGNOSIS — R29818 Other symptoms and signs involving the nervous system: Secondary | ICD-10-CM

## 2015-08-02 NOTE — Therapy (Signed)
[place] MAIN [facility] SERVICES [address] [place], [place], [address] Phone: [phone]   Fax:  [phone]  Speech Language Pathology Treatment  Patient Details  Name: Hannah Howard MRN: [id number] Date of Birth: 28-May-1941 Referring Provider:  Vladimir Crofts, MD  Encounter Date: 08/02/2015      End of Session - 08/02/15 1441    Visit Number 11   Number of Visits 17   Date for SLP Re-Evaluation 08/18/15   SLP Start Time 0901   SLP Stop Time  0957   SLP Time Calculation (min) 56 min   Activity Tolerance Patient tolerated treatment well      Past Medical History  Diagnosis Date  . GERD (gastroesophageal reflux disease)   . Anxiety   . Arthritis   . Depression   . Hyperlipidemia   . Parkinsonism     Past Surgical History  Procedure Laterality Date  . Knee arthroscopy Right 2010  . Knee arthroscopy Left 2012    There were no vitals filed for this visit.  Visit Diagnosis: Dysphonia      Subjective Assessment - 08/02/15 1440    Subjective The patient reports that she is consciously using her LOUD voice with friends/family.   Currently in Pain? No/denies               ADULT SLP TREATMENT - 08/02/15 0001    General Information   Behavior/Cognition Alert;Cooperative;Pleasant mood   Treatment Provided   Treatment provided Cognitive-Linquistic   Pain Assessment   Pain Assessment No/denies pain   Cognitive-Linquistic Treatment   Treatment focused on Voice;Other (comment)  LSVT-LOUD   Skilled Treatment Daily Task #1: Average 12 seconds, 84 dB. Requires min cues for quality and duration.  Daily Task 2: Highs: 15 high pitched "ah" given min cues. Lows: 15 low pitched "ah" given min cues. Daily task #3: Average 75 dB.  Hierarchal speech loudness drill: Read paragraphs, 74 dB. Generate sentences to answer questions 73 dB.  Homework: assignments completed.  Off the cuff remarks: average 71 dB.   Assessment /  Recommendations / Plan   Plan Continue with current plan of care   Progression Toward Goals   Progression toward goals Progressing toward goals          SLP Education - 08/02/15 1440    Education provided Yes   Education Details LSVT-LOUD   Person(s) Educated Patient   Methods Explanation;Demonstration;Verbal cues;Handout   Comprehension Verbalized understanding;Returned demonstration;Need further instruction;Verbal cues required            SLP Long Term Goals - 08/01/15 1051    SLP LONG TERM GOAL #1   Title The patient will complete Daily Tasks (Maximum duration "ah", High/Lows, and Functional Phrases) at average loudness of 80 dB and with loud, good quality voice.    Time 4   Period Weeks   Status Partially Met   SLP LONG TERM GOAL #2   Title The patient will complete Hierarchal Speech Loudness reading drills (words/phrases, sentences, and paragraph) at average 75 dB and with loud, good quality voice.     Time 4   Period Weeks   Status Partially Met   SLP LONG TERM GOAL #3   Title The patient will complete homework daily.   Time 4   Period Weeks   Status On-going   SLP LONG TERM GOAL #4   Title The patient will participate in conversation, maintaining average loudness of 75 dB and loud, good quality  voice.   Time 4   Period Weeks   Status Partially Met          Plan - 08/02/15 1441    Clinical Impression Statement The patient is completing daily tasks and hierarchal speech drill tasks with loud, good quality voice given fewer SLP cues for quality.  She is demonstrating emerging generalization into conversational speech.   Speech Therapy Frequency 4x / week   Duration 4 weeks   Treatment/Interventions Other (comment)  LSVT-LOUD   Potential to Achieve Goals Good   Potential Considerations Ability to learn/carryover information;Cooperation/participation level;Medical prognosis;Previous level of function;Severity of impairments;Family/community support;Other  (comment)   SLP Home Exercise Plan LSVT-LOUD daily homework   Consulted and Agree with Plan of Care Patient          G-Codes - Aug 05, 2015 1052    Functional Assessment Tool Used LSVT-LOUD protocol   Functional Limitations Voice   Voice Current Status (G9171) At least 20 percent but less than 40 percent impaired, limited or restricted   Voice Goal Status ([id number]) At least 1 percent but less than 20 percent impaired, limited or restricted      Problem List Patient Active Problem List   Diagnosis Date Noted  . Atrophic vaginitis 07/03/2015  . Urinary frequency 07/03/2015  . Urge incontinence 07/03/2015  . Anxiety and depression 07/08/2014  . BP (high blood pressure) 07/08/2014  . HLD (hyperlipidemia) 07/08/2014  . Idiopathic Parkinson's disease 07/08/2014  . DDD (degenerative disc disease), lumbar 04/04/2014  . Neuritis or radiculitis due to rupture of lumbar intervertebral disc 04/04/2014   Leroy Sea, MS/CCC- SLP  Lou Miner 08/02/2015, 2:42 PM  [place] MAIN [facility] SERVICES [address] [place], [place], [address] Phone: [phone]   Fax:  [phone]

## 2015-08-02 NOTE — Therapy (Signed)
[place] MAIN [facility] SERVICES [address] [place], [place], [address] Phone: [phone]   Fax:  [phone]  Physical Therapy Treatment  Patient Details  Name: Hannah Howard MRN: [id number] Date of Birth: 09-27-1941 Referring Provider:  Vladimir Crofts, MD  Encounter Date: 08/02/2015      PT End of Session - 08/02/15 1017    Visit Number 11   Number of Visits 17   Date for PT Re-Evaluation 08/07/15   PT Start Time 1000   PT Stop Time 1100   PT Time Calculation (min) 60 min   Equipment Utilized During Treatment Gait belt   Activity Tolerance Patient tolerated treatment well   Behavior During Therapy [facility] for tasks assessed/performed      Past Medical History  Diagnosis Date  . GERD (gastroesophageal reflux disease)   . Anxiety   . Arthritis   . Depression   . Hyperlipidemia   . Parkinsonism     Past Surgical History  Procedure Laterality Date  . Knee arthroscopy Right 2010  . Knee arthroscopy Left 2012    There were no vitals filed for this visit.  Visit Diagnosis:  Difficulty balancing  Difficulty in walking      Subjective Assessment - 08/02/15 1017    Subjective Patient is doing her HEP.    Currently in Pain? No/denies         outcome measures:  TUG 14.81s 10 MW = .77 m/sec 5 x sit to stand 21.05 sec 6 MW 645 feet.    Floor to ceiling x 10 reps, side to side 10 reps, step and reach forward x 10 reps, step and reach backwards x 10, step and reach sideways x 10 , Rock and reach forward/backward x 10 , Rock and reach sideways x 10, functional tasks; 1 sit to stand  Continues to have balance deficits typical with diagnosis. Patient performs beginning level exercises without pain behaviors and needs verbal cuing for postural alignment and head positioning CGA to SBA for safety with activities. Uses to increase intensity and amplitude of movements throughout session                         PT Education - 08/02/15 1017    Education provided Yes   Education Details LSVT BIG   Person(s) Educated Patient   Methods Explanation   Comprehension Verbalized understanding             PT Long Term Goals - 08/01/15 1032    PT LONG TERM GOAL #1   Status Partially Met   PT LONG TERM GOAL #2   Status Partially Met   PT LONG TERM GOAL #3   Status Partially Met   PT LONG TERM GOAL #4   Status Partially Met               Plan - 08/02/15 1018    Clinical Impression Statement  Patient needs occasional verbal cueing to improve posture and cueing to correctly perform exercises slowly, holding at end of range to increase motor firing of desired muscle to encourage fatigue   Pt will benefit from skilled therapeutic intervention in order to improve on the following deficits Abnormal gait;Decreased activity tolerance;Decreased strength;Pain;Decreased balance;Decreased mobility;Difficulty walking;Decreased safety awareness   Rehab Potential Good   PT Frequency 4x / week   PT Duration 4 weeks   PT Treatment/Interventions Balance training;Therapeutic exercise;Therapeutic activities;Functional mobility training;Stair training   PT Next Visit  Plan LSVT BIG   Consulted and Agree with Plan of Care Patient          G-Codes - August 20, 2015 1032    Functional Assessment Tool Used TUG, 10 MW, 6 MW, 5 x sit to stand   Functional Limitation Mobility: Walking and moving around   Mobility: Walking and Moving Around Goal Status [phone]) At least 20 percent but less than 40 percent impaired, limited or restricted      Problem List Patient Active Problem List   Diagnosis Date Noted  . Atrophic vaginitis 07/03/2015  . Urinary frequency 07/03/2015  . Urge incontinence 07/03/2015  . Anxiety and depression 07/08/2014  . BP (high blood pressure) 07/08/2014  . HLD (hyperlipidemia) 07/08/2014  . Idiopathic Parkinson's disease 07/08/2014  . DDD (degenerative disc disease), lumbar 04/04/2014   . Neuritis or radiculitis due to rupture of lumbar intervertebral disc 04/04/2014    Alanson Puls 08/02/2015, 10:26 AM  [place] MAIN [facility] SERVICES [place], [place], [address] Phone: [phone]   Fax:  [phone]

## 2015-08-03 ENCOUNTER — Ambulatory Visit: Payer: Medicare Other | Admitting: Physical Therapy

## 2015-08-03 ENCOUNTER — Ambulatory Visit: Payer: Medicare Other | Admitting: Speech Pathology

## 2015-08-03 ENCOUNTER — Encounter: Payer: Self-pay | Admitting: Physical Therapy

## 2015-08-03 ENCOUNTER — Encounter: Payer: Self-pay | Admitting: Speech Pathology

## 2015-08-03 DIAGNOSIS — R29818 Other symptoms and signs involving the nervous system: Secondary | ICD-10-CM

## 2015-08-03 DIAGNOSIS — R262 Difficulty in walking, not elsewhere classified: Secondary | ICD-10-CM

## 2015-08-03 DIAGNOSIS — R490 Dysphonia: Secondary | ICD-10-CM

## 2015-08-03 NOTE — Therapy (Signed)
[place] MAIN [facility] SERVICES [address] [place], [place], [address] Phone: [phone]   Fax:  [phone]  Physical Therapy Treatment  Patient Details  Name: Hannah Howard MRN: [id number] Date of Birth: 05-Apr-1941 Referring Provider:  Vladimir Crofts, MD  Encounter Date: 08/03/2015      PT End of Session - 08/03/15 0940    Visit Number [id number]   Date for PT Re-Evaluation 08/07/15   PT Start Time 1000   PT Stop Time 1100   PT Time Calculation (min) 60 min   Equipment Utilized During Treatment Gait belt   Activity Tolerance Patient tolerated treatment well   Behavior During Therapy [facility] for tasks assessed/performed      Past Medical History  Diagnosis Date  . GERD (gastroesophageal reflux disease)   . Anxiety   . Arthritis   . Depression   . Hyperlipidemia   . Parkinsonism     Past Surgical History  Procedure Laterality Date  . Knee arthroscopy Right 2010  . Knee arthroscopy Left 2012    There were no vitals filed for this visit.  Visit Diagnosis:  Difficulty balancing  Difficulty in walking      Subjective Assessment - 08/03/15 0939    Subjective Patient is doing fine today.    Currently in Pain? No/denies        Floor to ceiling x 10 reps, side to side 10 reps, step and reach forward x 10 reps, step and reach backwards x 10, step and reach sideways x 10 , Rock and reach forward/backward x 10 , Rock and reach sideways x 10, functional tasks; 1 sit to stand 2. Scooting up and down the table to get into a booth 3. Picking up feet to get into a car 4. Reaching activities to assist with putting dishes in a cabinet 5. Carrying objects with 2 hands . Continues to have balance deficits typical with diagnosis. Patient performs beginning level exercises without pain behaviors and needs verbal cuing for postural alignment and head positioning CGA to SBA for safety with activities. Uses to increase intensity and amplitude of  movements throughout session Loss of balance often to the R side                          PT Education - 08/03/15 0939    Education provided Yes   Education Details LSVT BIG   Person(s) Educated Patient   Methods Explanation   Comprehension Verbalized understanding             PT Long Term Goals - 08/01/15 1032    PT LONG TERM GOAL #1   Status Partially Met   PT LONG TERM GOAL #2   Status Partially Met   PT LONG TERM GOAL #3   Status Partially Met   PT LONG TERM GOAL #4   Status Partially Met               Plan - 08/03/15 0940    Clinical Impression Statement Patient has poor static and dynamic standing balance and needs frequent use of hands to steady herself and keep from losing her balance.   Pt will benefit from skilled therapeutic intervention in order to improve on the following deficits Abnormal gait;Decreased activity tolerance;Decreased strength;Pain;Decreased balance;Decreased mobility;Difficulty walking;Decreased safety awareness   Rehab Potential Good   PT Frequency 4x / week   PT Duration 4 weeks   PT Treatment/Interventions Balance training;Therapeutic exercise;Therapeutic  activities;Functional mobility training;Stair training   Consulted and Agree with Plan of Care Patient        Problem List Patient Active Problem List   Diagnosis Date Noted  . Atrophic vaginitis 07/03/2015  . Urinary frequency 07/03/2015  . Urge incontinence 07/03/2015  . Anxiety and depression 07/08/2014  . BP (high blood pressure) 07/08/2014  . HLD (hyperlipidemia) 07/08/2014  . Idiopathic Parkinson's disease 07/08/2014  . DDD (degenerative disc disease), lumbar 04/04/2014  . Neuritis or radiculitis due to rupture of lumbar intervertebral disc 04/04/2014    Alanson Puls 08/03/2015, 9:44 AM  [place] MAIN [facility] SERVICES [place], [place], [address] Phone: [phone]   Fax:   [phone]

## 2015-08-03 NOTE — Therapy (Signed)
[place] MAIN [facility] SERVICES [address] [place], [place], [address] Phone: [phone]   Fax:  [phone]  Speech Language Pathology Treatment  Patient Details  Name: Hannah Howard MRN: [id number] Date of Birth: Jun 15, 1941 Referring Provider:  Vladimir Crofts, MD  Encounter Date: 08/03/2015      End of Session - 08/03/15 1325    Visit Number 12   Number of Visits 17   Date for SLP Re-Evaluation 08/18/15   SLP Start Time 0907   SLP Stop Time  0957   SLP Time Calculation (min) 50 min   Activity Tolerance Patient tolerated treatment well      Past Medical History  Diagnosis Date  . GERD (gastroesophageal reflux disease)   . Anxiety   . Arthritis   . Depression   . Hyperlipidemia   . Parkinsonism     Past Surgical History  Procedure Laterality Date  . Knee arthroscopy Right 2010  . Knee arthroscopy Left 2012    There were no vitals filed for this visit.  Visit Diagnosis: Dysphonia      Subjective Assessment - 08/03/15 1325    Subjective The patient reports that she is consciously using her LOUD voice with friends/family.   Currently in Pain? No/denies               ADULT SLP TREATMENT - 08/03/15 0001    General Information   Behavior/Cognition Alert;Cooperative;Pleasant mood   Treatment Provided   Treatment provided Cognitive-Linquistic   Pain Assessment   Pain Assessment No/denies pain   Cognitive-Linquistic Treatment   Treatment focused on Voice;Other (comment)  LSVT-LOUD   Skilled Treatment Daily Task #1: Average 17 seconds, 85 dB. Requires min cues for quality and duration.  Daily Task 2: Highs: 15 high pitched "ah" given min cues. Lows: 15 low pitched "ah" given min cues. Daily task #3: Average 75 dB.  Hierarchal speech loudness drill: Read paragraphs, 74 dB. Generate sentences to answer questions 73 dB.  Homework: assignments completed.  Off the cuff remarks: average 71 dB.   Assessment /  Recommendations / Plan   Plan Continue with current plan of care   Progression Toward Goals   Progression toward goals Progressing toward goals          SLP Education - 08/03/15 1325    Education provided Yes   Education Details LSVT-LOUD   Person(s) Educated Patient   Methods Explanation;Demonstration;Verbal cues;Handout   Comprehension Verbalized understanding;Returned demonstration;Need further instruction            SLP Long Term Goals - 08/01/15 1051    SLP LONG TERM GOAL #1   Title The patient will complete Daily Tasks (Maximum duration "ah", High/Lows, and Functional Phrases) at average loudness of 80 dB and with loud, good quality voice.    Time 4   Period Weeks   Status Partially Met   SLP LONG TERM GOAL #2   Title The patient will complete Hierarchal Speech Loudness reading drills (words/phrases, sentences, and paragraph) at average 75 dB and with loud, good quality voice.     Time 4   Period Weeks   Status Partially Met   SLP LONG TERM GOAL #3   Title The patient will complete homework daily.   Time 4   Period Weeks   Status On-going   SLP LONG TERM GOAL #4   Title The patient will participate in conversation, maintaining average loudness of 75 dB and loud, good quality voice.  Time 4   Period Weeks   Status Partially Met          Plan - 08/03/15 1326    Clinical Impression Statement The patient is completing daily tasks and hierarchal speech drill tasks with loud, good quality voice given fewer SLP cues for quality.  She is demonstrating emerging generalization into conversational speech.        Problem List Patient Active Problem List   Diagnosis Date Noted  . Atrophic vaginitis 07/03/2015  . Urinary frequency 07/03/2015  . Urge incontinence 07/03/2015  . Anxiety and depression 07/08/2014  . BP (high blood pressure) 07/08/2014  . HLD (hyperlipidemia) 07/08/2014  . Idiopathic Parkinson's disease 07/08/2014  . DDD (degenerative disc  disease), lumbar 04/04/2014  . Neuritis or radiculitis due to rupture of lumbar intervertebral disc 04/04/2014   Leroy Sea, MS/CCC- SLP  Lou Miner 08/03/2015, 1:27 PM  [place] MAIN [facility] SERVICES [address] [place], [place], [address] Phone: [phone]   Fax:  [phone]

## 2015-08-07 ENCOUNTER — Encounter: Payer: Self-pay | Admitting: Speech Pathology

## 2015-08-07 ENCOUNTER — Ambulatory Visit: Payer: Medicare Other | Admitting: Physical Therapy

## 2015-08-07 ENCOUNTER — Ambulatory Visit: Payer: Medicare Other | Admitting: Speech Pathology

## 2015-08-07 DIAGNOSIS — R490 Dysphonia: Secondary | ICD-10-CM

## 2015-08-07 DIAGNOSIS — R262 Difficulty in walking, not elsewhere classified: Secondary | ICD-10-CM

## 2015-08-07 DIAGNOSIS — R29818 Other symptoms and signs involving the nervous system: Secondary | ICD-10-CM

## 2015-08-07 NOTE — Therapy (Signed)
[place] MAIN [facility] SERVICES [address] [place], [place], [address] Phone: [phone]   Fax:  [phone]  Speech Language Pathology Treatment  Patient Details  Name: Hannah Howard MRN: [id number] Date of Birth: 05-Oct-1941 Referring Provider:  Vladimir Crofts, MD  Encounter Date: 08/07/2015      End of Session - 08/07/15 1059    Visit Number 13   Number of Visits 17   Date for SLP Re-Evaluation 08/18/15   SLP Start Time 0904   SLP Stop Time  0959   SLP Time Calculation (min) 55 min   Activity Tolerance Patient tolerated treatment well      Past Medical History  Diagnosis Date  . GERD (gastroesophageal reflux disease)   . Anxiety   . Arthritis   . Depression   . Hyperlipidemia   . Parkinsonism     Past Surgical History  Procedure Laterality Date  . Knee arthroscopy Right 2010  . Knee arthroscopy Left 2012    There were no vitals filed for this visit.  Visit Diagnosis: Dysphonia      Subjective Assessment - 08/07/15 1058    Subjective The patient reports that she is consciously using her LOUD voice with friends/family.               ADULT SLP TREATMENT - 08/07/15 0001    General Information   Behavior/Cognition Alert;Cooperative;Pleasant mood   Treatment Provided   Treatment provided Cognitive-Linquistic   Pain Assessment   Pain Assessment No/denies pain   Cognitive-Linquistic Treatment   Treatment focused on Voice;Other (comment)  LSVT-LOUD   Skilled Treatment Daily Task #1: Average 17 seconds, 85 dB. Requires min cues for quality and duration.  Daily Task 2: Highs: 15 high pitched "ah" given min cues. Lows: 15 low pitched "ah" given min cues. Daily task #3: Average 75 dB.  Hierarchal speech loudness drill: Read paragraphs, 74 dB. Generate sentences to answer questions 73 dB.  Homework: assignments completed.  Off the cuff remarks: average 71 dB.   Assessment / Recommendations / Plan   Plan Continue with  current plan of care   Progression Toward Goals   Progression toward goals Progressing toward goals          SLP Education - 08/07/15 1058    Education provided Yes   Education Details LSVT-GRAM   Person(s) Educated Patient   Methods Explanation;Demonstration;Verbal cues;Handout   Comprehension Verbalized understanding;Returned demonstration;Need further instruction            SLP Long Term Goals - 08/01/15 1051    SLP LONG TERM GOAL #1   Title The patient will complete Daily Tasks (Maximum duration "ah", High/Lows, and Functional Phrases) at average loudness of 80 dB and with loud, good quality voice.    Time 4   Period Weeks   Status Partially Met   SLP LONG TERM GOAL #2   Title The patient will complete Hierarchal Speech Loudness reading drills (words/phrases, sentences, and paragraph) at average 75 dB and with loud, good quality voice.     Time 4   Period Weeks   Status Partially Met   SLP LONG TERM GOAL #3   Title The patient will complete homework daily.   Time 4   Period Weeks   Status On-going   SLP LONG TERM GOAL #4   Title The patient will participate in conversation, maintaining average loudness of 75 dB and loud, good quality voice.   Time 4   Period  Weeks   Status Partially Met          Plan - 08/07/15 1059    Clinical Impression Statement The patient is completing daily tasks and hierarchal speech drill tasks with loud, good quality voice given fewer SLP cues for quality.  She is demonstrating emerging generalization into conversational speech.   Speech Therapy Frequency 4x / week   Duration 4 weeks   Treatment/Interventions Other (comment)  LSVT-LOUD   Potential to Achieve Goals Good   Potential Considerations Ability to learn/carryover information;Cooperation/participation level;Medical prognosis;Previous level of function;Severity of impairments;Family/community support;Other (comment)   SLP Home Exercise Plan LSVT-LOUD daily homework    Consulted and Agree with Plan of Care Patient        Problem List Patient Active Problem List   Diagnosis Date Noted  . Atrophic vaginitis 07/03/2015  . Urinary frequency 07/03/2015  . Urge incontinence 07/03/2015  . Anxiety and depression 07/08/2014  . BP (high blood pressure) 07/08/2014  . HLD (hyperlipidemia) 07/08/2014  . Idiopathic Parkinson's disease 07/08/2014  . DDD (degenerative disc disease), lumbar 04/04/2014  . Neuritis or radiculitis due to rupture of lumbar intervertebral disc 04/04/2014   Leroy Sea, MS/CCC- SLP  Lou Miner 08/07/2015, 11:00 AM  [place] MAIN [facility] SERVICES [address] [place], [place], [address] Phone: [phone]   Fax:  [phone]

## 2015-08-08 ENCOUNTER — Ambulatory Visit: Payer: Medicare Other | Admitting: Physical Therapy

## 2015-08-08 ENCOUNTER — Ambulatory Visit: Payer: Medicare Other | Admitting: Speech Pathology

## 2015-08-09 ENCOUNTER — Ambulatory Visit: Payer: Medicare Other | Admitting: Physical Therapy

## 2015-08-09 ENCOUNTER — Ambulatory Visit: Payer: Medicare Other | Admitting: Speech Pathology

## 2015-08-09 NOTE — Therapy (Signed)
[place] MAIN [facility] SERVICES [address] [place], [place], [address] Phone: [phone]   Fax:  [phone]  Patient Details  Name: Hannah Howard MRN: [id number] Date of Birth: 02-06-1941 Referring Provider:  Vladimir Crofts, MD  Encounter Date: 08/07/2015  Patient came in today for PT treatment but was dizzy and not feeling well and she went home. Alanson Puls 08/09/2015, 10:47 AM  [place] MAIN [facility] SERVICES [address] [place], [place], [address] Phone: [phone]   Fax:  [phone]

## 2015-08-10 ENCOUNTER — Ambulatory Visit
Admission: RE | Admit: 2015-08-10 | Discharge: 2015-08-10 | Disposition: A | Discharge status: Home / Self Care | Payer: Medicare Other | Source: Ambulatory Visit | Attending: Internal Medicine | Admitting: Internal Medicine

## 2015-08-10 ENCOUNTER — Ambulatory Visit: Payer: Medicare Other | Admitting: Physical Therapy

## 2015-08-10 ENCOUNTER — Ambulatory Visit: Payer: Medicare Other | Admitting: Speech Pathology

## 2015-08-10 DIAGNOSIS — N63 Unspecified lump in breast: Secondary | ICD-10-CM | POA: Insufficient documentation

## 2015-08-10 DIAGNOSIS — R69 Illness, unspecified: Secondary | ICD-10-CM

## 2015-08-14 ENCOUNTER — Ambulatory Visit: Payer: Medicare Other | Admitting: Physical Therapy

## 2015-08-14 ENCOUNTER — Telehealth: Payer: Self-pay | Admitting: Urology

## 2015-08-14 ENCOUNTER — Encounter: Payer: Medicare Other | Admitting: Speech Pathology

## 2015-08-14 DIAGNOSIS — N3281 Overactive bladder: Secondary | ICD-10-CM

## 2015-08-14 MED ORDER — MIRABEGRON ER 50 MG PO TB24: 50.0000 mg | ORAL_TABLET | Freq: Every day | ORAL | Status: DC

## 2015-08-14 NOTE — Telephone Encounter (Signed)
Pt was seen in our office and Dr. Elnoria Howard gave her samples of Myrbetriq 50 mg and she would like a rx called in to [facility] on [address] [phone]

## 2015-08-17 ENCOUNTER — Other Ambulatory Visit: Payer: Self-pay | Admitting: Internal Medicine

## 2015-08-17 DIAGNOSIS — N6320 Unspecified lump in the left breast, unspecified quadrant: Secondary | ICD-10-CM

## 2015-12-22 ENCOUNTER — Ambulatory Visit (INDEPENDENT_AMBULATORY_CARE_PROVIDER_SITE_OTHER): Payer: Medicare Other | Admitting: Urology

## 2015-12-22 VITALS — BP 132/65 | HR 111 | Ht 66.0 in | Wt 236.3 lb

## 2015-12-22 DIAGNOSIS — R350 Frequency of micturition: Secondary | ICD-10-CM

## 2015-12-22 DIAGNOSIS — N952 Postmenopausal atrophic vaginitis: Secondary | ICD-10-CM

## 2015-12-22 DIAGNOSIS — R351 Nocturia: Secondary | ICD-10-CM | POA: Diagnosis not present

## 2015-12-22 DIAGNOSIS — N3941 Urge incontinence: Secondary | ICD-10-CM | POA: Diagnosis not present

## 2015-12-22 LAB — URINALYSIS, COMPLETE
BILIRUBIN UA: NEGATIVE
Glucose, UA: NEGATIVE
NITRITE UA: NEGATIVE
PH UA: 5.5 (ref 5.0–7.5)
Protein, UA: NEGATIVE
Specific Gravity, UA: 1.02 (ref 1.005–1.030)
Urobilinogen, Ur: 1 mg/dL (ref 0.2–1.0)

## 2015-12-22 LAB — MICROSCOPIC EXAMINATION

## 2015-12-22 LAB — BLADDER SCAN AMB NON-IMAGING

## 2015-12-22 MED ORDER — TOLTERODINE TARTRATE ER 4 MG PO CP24: 4.0000 mg | ORAL_CAPSULE | Freq: Every day | ORAL | Status: DC

## 2015-12-22 NOTE — Progress Notes (Signed)
16/08/9603 54:09 AM   Hannah Howard [birth year] [id number]  Referring provider: Idelle Crouch, MD [facility] [place], [place] [address]  Chief Complaint  Patient presents with  . Nocturia  . Urinary Frequency    HPI: 75 yo F with Parkinson's diease (slow progression) with urinary frequency, urgency, and urge incontinence.  She is currently on Mybetriq 50 mg which has helped some with her but has done better with tolteradine in the past.       She is now getting up 3-4 times each night where it was previously 7-8 times.  Her daytime frequency has also improved some in the morning but not in the afternoon or evening.     She said tolteradine [employer]) worked in the past but it was too expensive.  She failed oxybutynin and vesicare.    She also has a history of vaginal atrophy/ labial irritaiton but unable to afford compounded estrogen cream.      She never has kept a urinary diary.   She does have bilateral lower extremity edema tries to keep her feet up when she is sitting during the day. She avoids drinking beverages before bedtime.  No SUI.    She is going to be started HRT by her endocrinologist in the near future for the management of hot flashes.    PMH: Past Medical History  Diagnosis Date  . GERD (gastroesophageal reflux disease)   . Anxiety   . Arthritis   . Depression   . Hyperlipidemia   . Parkinsonism     Surgical History: Past Surgical History  Procedure Laterality Date  . Knee arthroscopy Right 2010  . Knee arthroscopy Left 2012    Home Medications:    Medication List       This list is accurate as of: 12/22/15 11:59 PM.  Always use your most recent med list.               aspirin EC 81 MG tablet  Take by mouth.     carbidopa-levodopa 25-100 MG tablet  Commonly known as:  SINEMET IR     celecoxib 200 MG capsule  Commonly known as:  CELEBREX  TAKE 1 CAPSULE EVERY DAY     clotrimazole-betamethasone cream    Commonly known as:  LOTRISONE  APPLY TOPICALLY TWO TIMES DAILY     hydrOXYzine 50 MG tablet  Commonly known as:  ATARAX/VISTARIL  TAKE ONE TABLET 3 TIMES DAILY AS NEEDED FOR ITCHING     lansoprazole 30 MG capsule  Commonly known as:  PREVACID     lovastatin 40 MG tablet  Commonly known as:  MEVACOR     mirabegron ER 50 MG Tb24 tablet  Commonly known as:  MYRBETRIQ  Take 1 tablet (50 mg total) by mouth daily.     multivitamin capsule  Take by mouth.     sertraline 100 MG tablet  Commonly known as:  ZOLOFT     STOOL SOFTENER 100 MG capsule  Generic drug:  docusate sodium  Take by mouth.     tolterodine 4 MG 24 hr capsule  Commonly known as:  DETROL LA  Take 1 capsule (4 mg total) by mouth daily.     traZODone 150 MG tablet  Commonly known as:  DESYREL     triamcinolone cream 0.1 %  Commonly known as:  KENALOG        Allergies: No Known Allergies  Family History: Family History  Problem Relation  Age of Onset  . Hematuria    . Kidney failure    . Prostate cancer      Social History:  reports that she quit smoking about 15 years ago. Her smoking use included Cigarettes. She does not have any smokeless tobacco history on file. She reports that she does not drink alcohol or use illicit drugs.  ROS: UROLOGY Frequent Urination?: Yes Hard to postpone urination?: Yes Burning/pain with urination?: No Get up at night to urinate?: Yes Leakage of urine?: Yes Urine stream starts and stops?: Yes Trouble starting stream?: No Do you have to strain to urinate?: Yes Blood in urine?: No Urinary tract infection?: No Sexually transmitted disease?: No Injury to kidneys or bladder?: No Painful intercourse?: No Weak stream?: No Currently pregnant?: No Vaginal bleeding?: No Last menstrual period?: n  Gastrointestinal Nausea?: No Vomiting?: No Indigestion/heartburn?: Yes Diarrhea?: No Constipation?: Yes  Constitutional Fever: No Night sweats?: Yes Weight loss?:  No Fatigue?: Yes  Skin Skin rash/lesions?: No Itching?: No  Eyes Blurred vision?: No Double vision?: No  Ears/Nose/Throat Sore throat?: No Sinus problems?: No  Hematologic/Lymphatic Swollen glands?: No Easy bruising?: No  Cardiovascular Leg swelling?: Yes Chest pain?: No  Respiratory Cough?: No Shortness of breath?: No  Endocrine Excessive thirst?: No  Musculoskeletal Back pain?: Yes Joint pain?: No  Neurological Headaches?: Yes Dizziness?: Yes  Psychologic Depression?: Yes Anxiety?: Yes  Physical Exam: BP 132/65 mmHg  Pulse 111  Ht _0  (1.676 m)  Wt 236 lb 4.8 oz (107.185 kg)  BMI 38.16 kg/m2  Constitutional:  Alert and oriented, No acute distress. HEENT: [place] AT, moist mucus membranes.  Trachea midline, no masses. Cardiovascular: No clubbing, cyanosis.  Bilateral pitting lower extremity edema with significant varicose veins noted. Respiratory: Normal respiratory effort, no increased work of breathing. GI: Abdomen is soft, nontender, nondistended, no abdominal masses GU: No CVA tenderness.  Skin: No rashes, bruises or suspicious lesions Neurologic: Grossly intact, no focal deficits, moving all 4 extremities. Psychiatric: Normal mood and affect.  Laboratory Data: Comprehensive Metabolic Panel (CMP) (07/68/0881 9:42 AM) Comprehensive Metabolic Panel (CMP) (09/24/5944 9:42 AM)  Component Value Ref Range  Glucose 91 70 - 110 mg/dL  Sodium 140 136 - 145 mmol/L  Potassium 4.4 3.6 - 5.1 mmol/L  Chloride 102 97 - 109 mmol/L  Carbon Dioxide (CO2) 26.0 22.0 - 32.0 mmol/L  Urea Nitrogen (BUN) 18 7 - 25 mg/dL  Creatinine 1.2 (H) 0.6 - 1.1 mg/dL  Glomerular Filtration Rate (eGFR), MDRD Estimate 44 (L) >60 mL/min/1.73sq m  Calcium 9.8 8.7 - 10.3 mg/dL     Urinalysis UA today 6-10 WBC, RBC 0-2, >10 epithelial, many bacteria.  No nitrates.    Pertinent Imaging: Results for orders placed or performed in visit on 12/22/15  Microscopic Examination  Result  Value Ref Range   WBC, UA 6-10 (A) 0 -  5 /hpf   RBC, UA 0-2 0 -  2 /hpf   Epithelial Cells (non renal) >10 (H) 0 - 10 /hpf   Bacteria, UA Many (A) None seen/Few   Yeast, UA Present (A) None seen  Urinalysis, Complete  Result Value Ref Range   Specific Gravity, UA 1.020 1.005 - 1.030   pH, UA 5.5 5.0 - 7.5   Color, UA Yellow Yellow   Appearance Ur Clear Clear   Leukocytes, UA 1+ (A) Negative   Protein, UA Negative Negative/Trace   Glucose, UA Negative Negative   Ketones, UA Trace (A) Negative   RBC, UA Trace (A) Negative  Bilirubin, UA Negative Negative   Urobilinogen, Ur 1.0 0.2 - 1.0 mg/dL   Nitrite, UA Negative Negative   Microscopic Examination See below:   BLADDER SCAN AMB NON-IMAGING  Result Value Ref Range   Scan Result 61m     Assessment & Plan:    1. Urinary frequency Some improvement with Mybetriq 50 mg. Previously did better with Detrol but not on formulary. She would like to continue Mybetriq for cost reasons. PVR today minimal. UA somewhat suspicious but appears to be contaminated collection. We will send urine culture to rule out infection although unlikely.  We discussed at length today that overactivity may be related to underlying neurological diagnosis of Parkinson's disease. - BLADDER SCAN AMB NON-IMAGING - CULTURE, URINE COMPREHENSIVE  2. Nocturia Reiterated behavioral modification with avoidance of beverages in the late evening. Keep feet up during the day as possible to avoid fluid redistribution at night. - Urinalysis, Complete - CULTURE, URINE COMPREHENSIVE  3. Urge incontinence As above  4. Atrophic vaginitis Recommended filling vaginal estrogen cream as this was not done previously. Alternatively, if she is going to be starting Prempro, that would probably suffice.  Return in about 6 months (around 06/20/2016) for PVR, recheck voiding symptoms.  AHollice Espy MD  [facility] [address] [place] [phone] Addendum: Called by pharmacy. Mybetriq is no longer on formulary this year and will cost her over $150 each month. Plan to prescribe Detrol instead as this has been helpful in the past.

## 2015-12-23 ENCOUNTER — Encounter: Payer: Self-pay | Admitting: Urology

## 2015-12-25 LAB — CULTURE, URINE COMPREHENSIVE

## 2016-01-24 DIAGNOSIS — E669 Obesity, unspecified: Secondary | ICD-10-CM | POA: Insufficient documentation

## 2016-01-25 ENCOUNTER — Other Ambulatory Visit: Payer: Self-pay | Admitting: Physical Medicine and Rehabilitation

## 2016-01-25 DIAGNOSIS — M5416 Radiculopathy, lumbar region: Secondary | ICD-10-CM

## 2016-02-08 ENCOUNTER — Ambulatory Visit
Admission: RE | Admit: 2016-02-08 | Discharge: 2016-02-08 | Disposition: A | Discharge status: Home / Self Care | Payer: Medicare Other | Source: Ambulatory Visit | Attending: Internal Medicine | Admitting: Internal Medicine

## 2016-02-08 ENCOUNTER — Other Ambulatory Visit: Payer: Self-pay | Admitting: Internal Medicine

## 2016-02-08 DIAGNOSIS — N6320 Unspecified lump in the left breast, unspecified quadrant: Secondary | ICD-10-CM

## 2016-02-08 DIAGNOSIS — N63 Unspecified lump in breast: Secondary | ICD-10-CM | POA: Diagnosis present

## 2016-02-08 DIAGNOSIS — R928 Other abnormal and inconclusive findings on diagnostic imaging of breast: Secondary | ICD-10-CM | POA: Diagnosis not present

## 2016-02-14 ENCOUNTER — Ambulatory Visit: Payer: Medicare Other

## 2016-02-20 ENCOUNTER — Ambulatory Visit
Admission: RE | Admit: 2016-02-20 | Discharge: 2016-02-20 | Disposition: A | Discharge status: Home / Self Care | Payer: Medicare Other | Source: Ambulatory Visit | Attending: Physical Medicine and Rehabilitation | Admitting: Physical Medicine and Rehabilitation

## 2016-02-20 DIAGNOSIS — M5416 Radiculopathy, lumbar region: Secondary | ICD-10-CM | POA: Diagnosis present

## 2016-04-09 DIAGNOSIS — M25612 Stiffness of left shoulder, not elsewhere classified: Secondary | ICD-10-CM | POA: Insufficient documentation

## 2016-04-23 DIAGNOSIS — M7918 Myalgia, other site: Secondary | ICD-10-CM | POA: Insufficient documentation

## 2016-06-21 ENCOUNTER — Ambulatory Visit: Payer: Medicare Other | Admitting: Urology

## 2016-10-16 IMAGING — US US BREAST LTD UNI LEFT INC AXILLA
1 series · 7 of 7 positions shown · non-contrast
Comparison: Previous exam(s).

CLINICAL DATA: Six-month followup left breast mass

EXAM:
ULTRASOUND OF THE LEFT BREAST

[Series 1: us breast ltd uni left inc axilla · 0.06mm/px · 7 of 7 slices shown]
[im 1/7]
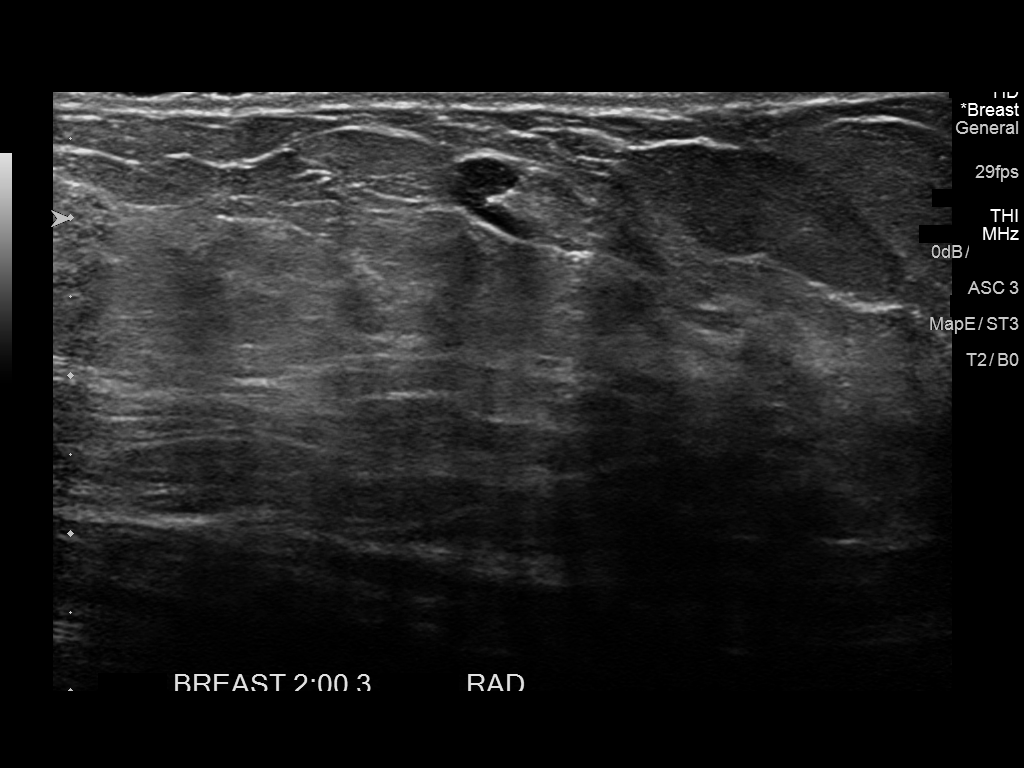
[im 2/7]
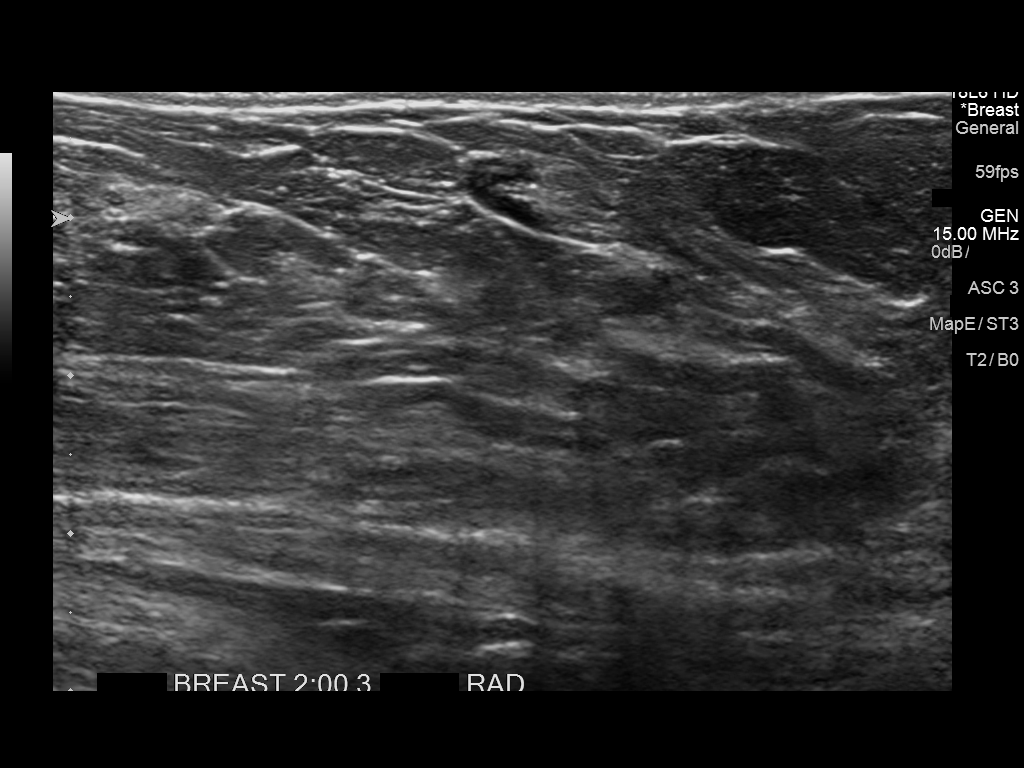
[im 3/7]
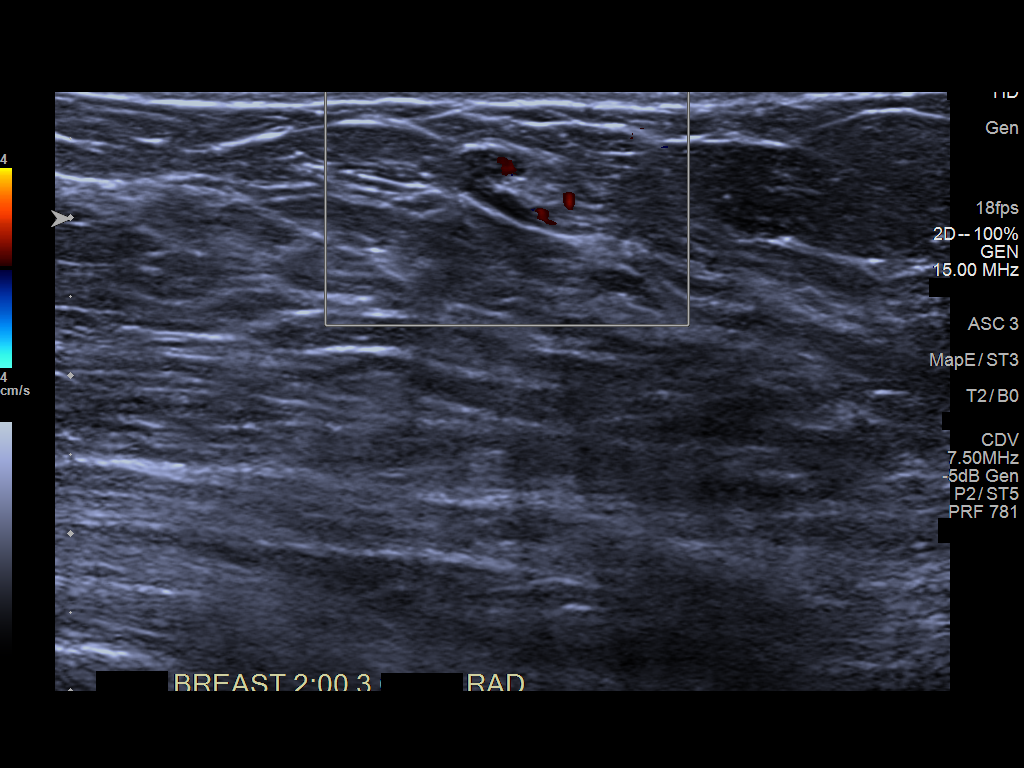
[im 4/7]
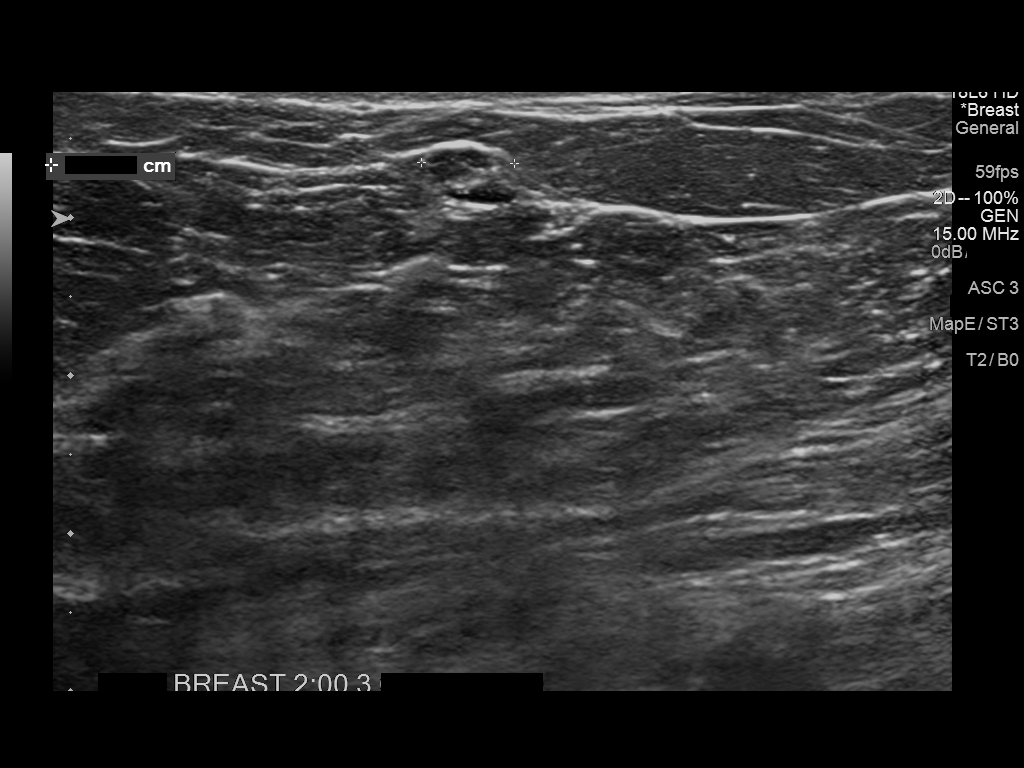
[im 5/7]
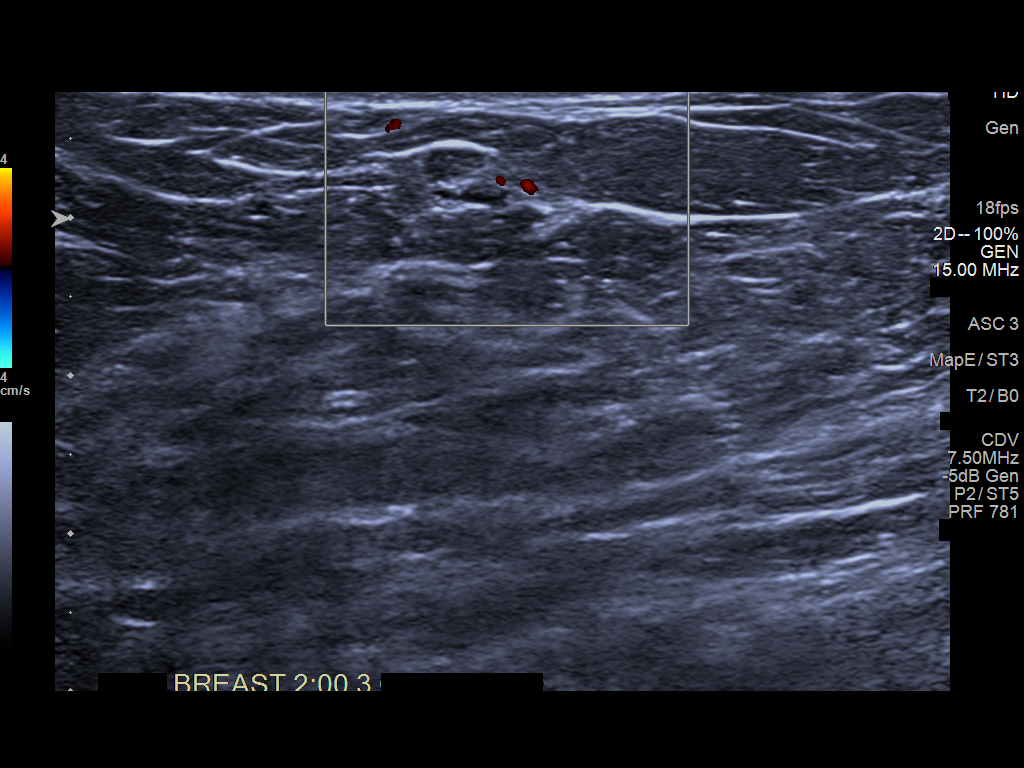
[im 6/7]
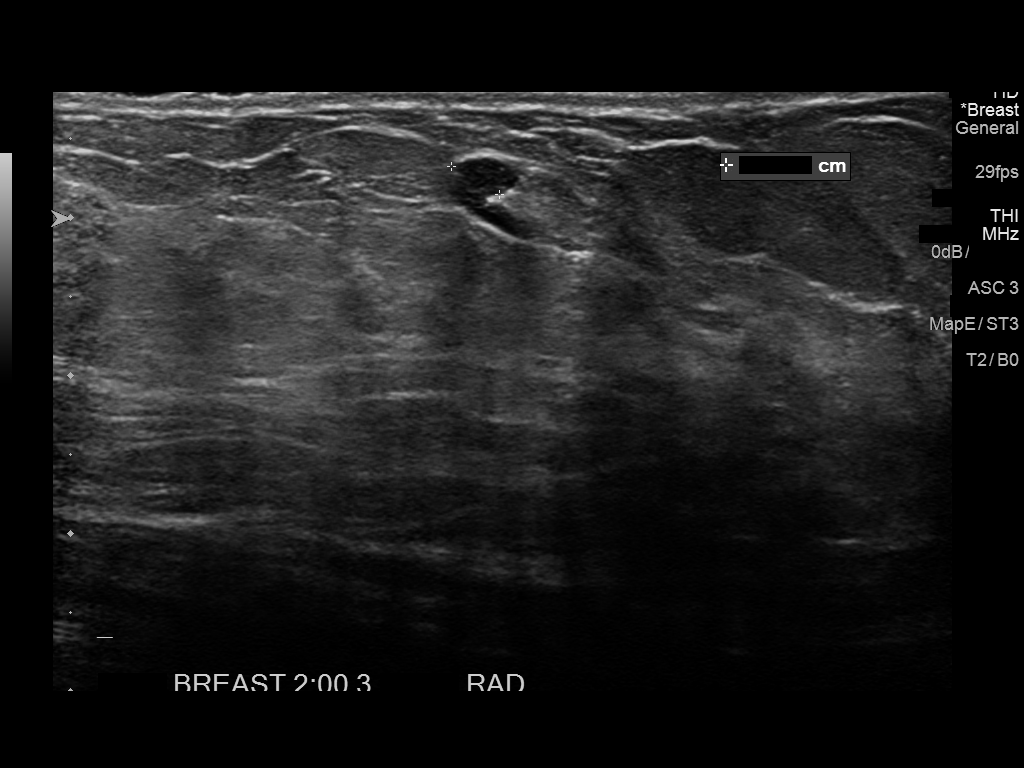
[im 7/7]
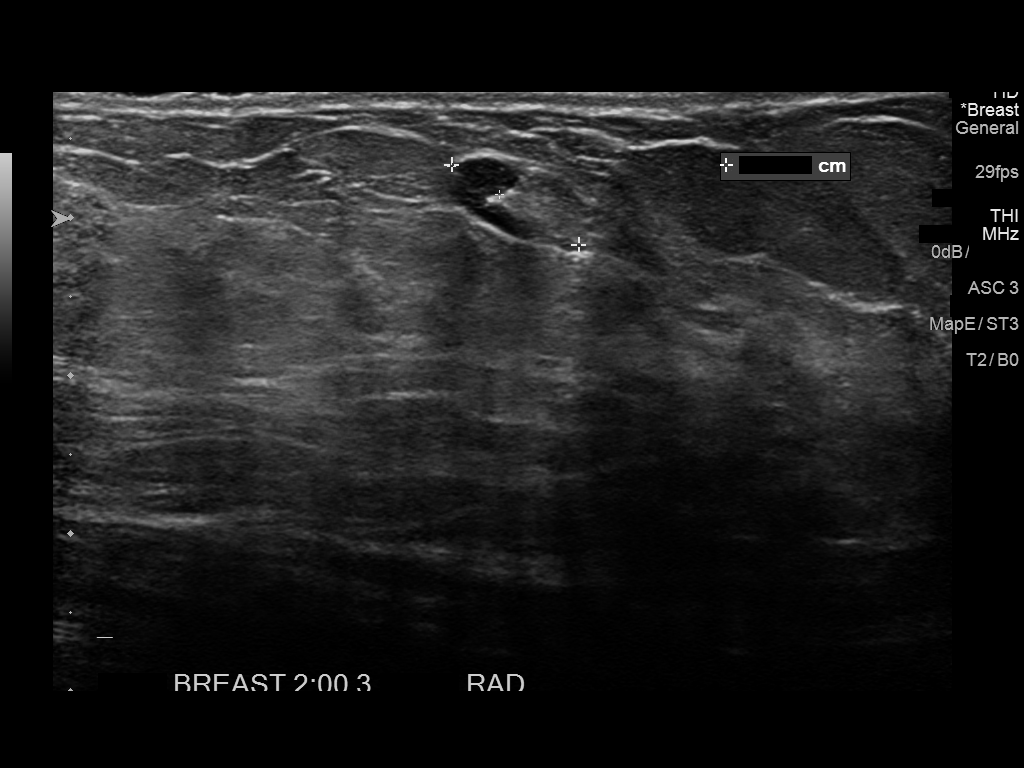

[7 of 7 positions shown; findings below may reference images not displayed]

FINDINGS: Targeted ultrasound is performed, showing stable intramammary lymph
node at the left breast 2 o'clock 3 cm from nipple measuring 0.56 x
0.95 cm, unchanged compared to prior exam, lesion on previous exam
remeasured.
IMPRESSION: Probable benign findings.

RECOMMENDATION:
Six-month follow-up ultrasound left breast.

I have discussed the findings and recommendations with the patient.
Results were also provided in writing at the conclusion of the
visit. If applicable, a reminder letter will be sent to the patient
regarding the next appointment.

BI-RADS CATEGORY  3: Probably benign finding(s) - short interval
follow-up suggested.

## 2016-12-30 ENCOUNTER — Other Ambulatory Visit: Payer: Self-pay | Admitting: Internal Medicine

## 2016-12-30 DIAGNOSIS — Z1231 Encounter for screening mammogram for malignant neoplasm of breast: Secondary | ICD-10-CM

## 2017-02-10 ENCOUNTER — Ambulatory Visit
Admission: RE | Admit: 2017-02-10 | Discharge: 2017-02-10 | Disposition: A | Discharge status: Home / Self Care | Payer: Medicare Other | Source: Ambulatory Visit | Attending: Internal Medicine | Admitting: Internal Medicine

## 2017-02-10 DIAGNOSIS — Z1231 Encounter for screening mammogram for malignant neoplasm of breast: Secondary | ICD-10-CM | POA: Diagnosis present

## 2017-09-02 ENCOUNTER — Other Ambulatory Visit: Payer: Self-pay | Admitting: Internal Medicine

## 2017-09-02 DIAGNOSIS — R42 Dizziness and giddiness: Secondary | ICD-10-CM

## 2017-09-02 DIAGNOSIS — R0609 Other forms of dyspnea: Secondary | ICD-10-CM

## 2017-09-02 DIAGNOSIS — R079 Chest pain, unspecified: Secondary | ICD-10-CM

## 2017-09-05 DIAGNOSIS — R42 Dizziness and giddiness: Secondary | ICD-10-CM | POA: Insufficient documentation

## 2017-09-05 DIAGNOSIS — R0602 Shortness of breath: Secondary | ICD-10-CM | POA: Insufficient documentation

## 2017-09-05 DIAGNOSIS — R0789 Other chest pain: Secondary | ICD-10-CM | POA: Insufficient documentation

## 2017-09-11 ENCOUNTER — Ambulatory Visit
Admission: RE | Admit: 2017-09-11 | Discharge: 2017-09-11 | Disposition: A | Discharge status: Home / Self Care | Payer: Medicare Other | Source: Ambulatory Visit | Attending: Internal Medicine | Admitting: Internal Medicine

## 2017-09-11 DIAGNOSIS — R0609 Other forms of dyspnea: Secondary | ICD-10-CM

## 2017-09-11 DIAGNOSIS — I700 Atherosclerosis of aorta: Secondary | ICD-10-CM | POA: Diagnosis not present

## 2017-09-11 DIAGNOSIS — E041 Nontoxic single thyroid nodule: Secondary | ICD-10-CM | POA: Insufficient documentation

## 2017-09-11 DIAGNOSIS — R079 Chest pain, unspecified: Secondary | ICD-10-CM

## 2017-09-11 MED ORDER — IOPAMIDOL (ISOVUE-370) INJECTION 76%
75.0000 mL | Freq: Once | INTRAVENOUS | Status: AC | PRN
Start: 2017-09-11 — End: 2017-09-11
  Administered 2017-09-11: 75 mL via INTRAVENOUS

## 2017-09-16 ENCOUNTER — Ambulatory Visit
Admission: RE | Admit: 2017-09-16 | Discharge: 2017-09-16 | Disposition: A | Discharge status: Home / Self Care | Payer: Medicare Other | Source: Ambulatory Visit | Attending: Internal Medicine | Admitting: Internal Medicine

## 2017-09-16 DIAGNOSIS — R42 Dizziness and giddiness: Secondary | ICD-10-CM | POA: Insufficient documentation

## 2017-09-16 DIAGNOSIS — H539 Unspecified visual disturbance: Secondary | ICD-10-CM | POA: Diagnosis present

## 2017-10-01 ENCOUNTER — Ambulatory Visit: Payer: Medicare Other | Admitting: Physical Therapy

## 2017-10-01 ENCOUNTER — Encounter: Payer: Self-pay | Admitting: Physical Therapy

## 2017-10-01 ENCOUNTER — Ambulatory Visit: Payer: Medicare Other | Attending: Internal Medicine | Admitting: Physical Therapy

## 2017-10-01 DIAGNOSIS — M6281 Muscle weakness (generalized): Secondary | ICD-10-CM | POA: Diagnosis present

## 2017-10-01 DIAGNOSIS — R262 Difficulty in walking, not elsewhere classified: Secondary | ICD-10-CM | POA: Diagnosis not present

## 2017-10-01 NOTE — Therapy (Signed)
[place] MAIN [facility] SERVICES [address] [place], [place], [address] Phone: [phone]   Fax:  [phone]  Physical Therapy Treatment  Patient Details  Name: Hannah Howard MRN: [id number] Date of Birth: [birth year] Referring Provider: Fulton Reek D    Encounter Date: 10/01/2017  PT End of Session - 10/01/17 1100    Visit Number  1    Number of Visits  17    Date for PT Re-Evaluation  11/26/17    PT Start Time  8250    PT Stop Time  1145    PT Time Calculation (min)  60 min    Equipment Utilized During Treatment  Gait belt    Activity Tolerance  Patient limited by fatigue;Patient tolerated treatment well       Past Medical History:  Diagnosis Date  . Anxiety   . Arthritis   . Depression   . GERD (gastroesophageal reflux disease)   . Hyperlipidemia   . Parkinsonism [facility])     Past Surgical History:  Procedure Laterality Date  . BREAST BIOPSY Left 1973   neg  . KNEE ARTHROSCOPY Right 2010  . KNEE ARTHROSCOPY Left 2012    There were no vitals filed for this visit.  Subjective Assessment - 10/01/17 1051    Subjective  Patient has had 2 falls in the last 2 months and she is not walking very well. She is slowing down her gait and not able to walk intermediate and long distances.    Patient is accompained by:  Family member    Pertinent History  Patient has been having a slow decline in mobility with ambulation and transfers. She is not able to ambulate intermediate and long distances and needs to rest frquently . She has had 2 falls and has a fear of falling.     Limitations  Standing;Walking;House hold activities    Patient Stated Goals  patient wants to walk longer distances and does not want to keep falling    Currently in Pain?  Yes    Pain Score  5     Pain Location  Back    Pain Descriptors / Indicators  Aching    Pain Type  Chronic pain    Pain Onset  More than a month ago    Pain Frequency  Constant    Multiple  Pain Sites  No         [facility] PT Assessment - 10/01/17 0001      Assessment   Medical Diagnosis  Parkinsons    Referring Provider  SPARKS, JEFFREY D     Onset Date/Surgical Date  09/09/17    Hand Dominance  Right    Prior Therapy  no      Precautions   Precautions  Fall      Restrictions   Weight Bearing Restrictions  No      Balance Screen   Has the patient fallen in the past 6 months  Yes    How many times?  2    Has the patient had a decrease in activity level because of a fear of falling?   Yes    Is the patient reluctant to leave their home because of a fear of falling?   No      Home Social worker  Private residence    Living Arrangements  Alone    Available Help at Discharge  Friend(s)    Type of [place]  Home Access  Stairs to enter    Entrance Stairs-Number of Steps  0    Entrance Stairs-Rails  None    Home Layout  Two level    Alternate Level Stairs-Number of Steps  14    Alternate Level Stairs-Rails  Right;Left    Home Equipment  [place] - single point;Walker - 2 wheels      Prior Function   Level of Independence  Independent    Vocation  Retired    Leisure  TV       Cognition   Overall Cognitive Status  Within Functional Limits for tasks assessed        POSTURE: WFL   PROM: WFL  AROM: WFL  STRENGTH:  Graded on a 0-5 scale Muscle Group Left Right                          Hip Flex -3/5 3/5  Hip Abd -3/5 -3/5  Hip Add -3/5 -3/5  Hip Ext -3/5 -3/5  Hip IR/ER 3/5 3/5  Knee Flex 5/5 5/5  Knee Ext 5/5 5/5  Ankle DF 4/5 4/5  Ankle PF     SENSATION: WNL for light and deep BLE   FUNCTIONAL MOBILITY:slow mobility sit to stand with use of UE Slow bed mobility prone to supine and supine to sidelying   BALANCE: Static Standing Balance  Normal Able to maintain standing balance against maximal resistance   Good Able to maintain standing balance against moderate resistance   Good-/Fair+ Able to maintain standing  balance against minimal resistance   Fair Able to stand unsupported without UE support and without LOB for 1-2 min x  Fair- Requires Min A and UE support to maintain standing without loss of balance   Poor+ Requires mod A and UE support to maintain standing without loss of balance   Poor Requires max A and UE support to maintain standing balance without loss    GAIT: Ambulates without AD with slow gait speed   OUTCOME MEASURES: TEST Outcome Interpretation  5 times sit<>stand With UE support 26.99sec >26 yo, >15 sec indicates increased risk for falls  10 meter walk test    . 73             m/s <1.0 m/s indicates increased risk for falls; limited community ambulator  Timed up and Go     23.05            sec <14 sec indicates increased risk for falls  6 minute walk test                Feet 1000 feet is community ambulator                                PT Education - 10/01/17 1059    Education provided  Yes    Education Details  plan of care    Person(s) Educated  Patient    Methods  Explanation;Demonstration    Comprehension  Verbalized understanding;Returned demonstration       PT Short Term Goals - 10/01/17 1126      PT SHORT TERM GOAL #1   Title  Patient will increase six minute walk test distance to >500 for progression to community ambulator and improve gait ability     Time  4    Period  Weeks    Status  New  Target Date  10/29/17      PT SHORT TERM GOAL #2   Title  Patient (< 48 years old) will complete five times sit to stand test in < 10 seconds indicating an increased LE strength and improved balance.    Time  4    Period  Weeks    Status  New    Target Date  10/29/17        PT Long Term Goals - 25-Oct-2017 1127      PT LONG TERM GOAL #1   Title  Patient will be independent in home exercise program to improve strength/mobility for better functional independence with ADLs     Time  8    Period  Weeks    Target Date  11/26/17      PT LONG  TERM GOAL #2   Title  Patient (< 29 years old) will complete five times sit to stand test in < 10 seconds indicating an increased LE strength and improved balance.    Time  8    Period  Weeks    Status  New    Target Date  11/26/17      PT LONG TERM GOAL #3   Title  Patient will increase six minute walk test distance to >1000 for progression to community ambulator and improve gait ability     Time  8    Period  Weeks    Status  New    Target Date  11/26/17      PT LONG TERM GOAL #4   Title  Patient will increase 10 meter walk test to >1.86m/s as to improve gait speed for better community ambulation and to reduce fall     Time  8    Period  Weeks    Status  New    Target Date  11/26/17            Plan - 25-Oct-2017 1117    Clinical Impression Statement  Patient is 76 yr old female who presents with decerased strength in BLE, decreased dynamic standing balance, decreased gait speed , decreased power evidenced by decreased 5 x sit to stand. She has decreased 6 MW test and is a falls risk. She will benefit from skilled PT to improve quality of life , strength and decrease her falls risk.     History and Personal Factors relevant to plan of care:  falls, changing back pain    Clinical Presentation  Evolving    Clinical Presentation due to:  falls, changing back pain    Clinical Decision Making  Moderate    Rehab Potential  Good    PT Frequency  2x / week    PT Duration  8 weeks    PT Treatment/Interventions  Balance training;Therapeutic exercise;Therapeutic activities;Functional mobility training;Stair training;Aquatic Therapy;Patient/family education    PT Next Visit Plan  therapeutic exercise and balance training    PT Home Exercise Plan  seated marching and LAQ    Consulted and Agree with Plan of Care  Patient       Patient will benefit from skilled therapeutic intervention in order to improve the following deficits and impairments:  Abnormal gait, Decreased endurance, Decreased  mobility, Difficulty walking, Decreased activity tolerance, Decreased safety awareness, Decreased strength, Pain, Impaired UE functional use, Obesity, Dizziness  Visit Diagnosis: Difficulty in walking, not elsewhere classified  Muscle weakness (generalized)   G-Codes - Oct 25, 2017 1123    Functional Assessment Tool Used (Outpatient Only)  TUG, 10  MW, 6 MW, 5 x sit to stand    Functional Limitation  Mobility: Walking and moving around    Mobility: Walking and Moving Around Current Status [phone])  At least 60 percent but less than 80 percent impaired, limited or restricted    Mobility: Walking and Moving Around Goal Status [phone])  At least 40 percent but less than 60 percent impaired, limited or restricted       Problem List Patient Active Problem List   Diagnosis Date Noted  . Atrophic vaginitis 07/03/2015  . Urinary frequency 07/03/2015  . Urge incontinence 07/03/2015  . Anxiety and depression 07/08/2014  . BP (high blood pressure) 07/08/2014  . HLD (hyperlipidemia) 07/08/2014  . Idiopathic Parkinson's disease ([place]) 07/08/2014  . Parkinson's disease ([place]) 07/08/2014  . DDD (degenerative disc disease), lumbar 04/04/2014  . Neuritis or radiculitis due to rupture of lumbar intervertebral disc 04/04/2014    Alanson Puls, [place] DPT 10/01/2017, 11:30 AM  [place] MAIN [facility] SERVICES [address] [place], [place], [address] Phone: [phone]   Fax:  [phone]  Name: Hannah Howard MRN: [id number] Date of Birth: 21-Jul-1941

## 2017-10-02 ENCOUNTER — Other Ambulatory Visit: Payer: Self-pay | Admitting: Unknown Physician Specialty

## 2017-10-03 ENCOUNTER — Ambulatory Visit
Admission: RE | Admit: 2017-10-03 | Discharge: 2017-10-03 | Disposition: A | Discharge status: Home / Self Care | Payer: Self-pay | Source: Ambulatory Visit | Attending: Unknown Physician Specialty | Admitting: Unknown Physician Specialty

## 2017-10-03 ENCOUNTER — Other Ambulatory Visit: Payer: Self-pay | Admitting: Unknown Physician Specialty

## 2017-10-03 DIAGNOSIS — E041 Nontoxic single thyroid nodule: Secondary | ICD-10-CM

## 2017-10-06 ENCOUNTER — Other Ambulatory Visit: Payer: Self-pay | Admitting: Unknown Physician Specialty

## 2017-10-06 DIAGNOSIS — E041 Nontoxic single thyroid nodule: Secondary | ICD-10-CM

## 2017-10-07 ENCOUNTER — Ambulatory Visit: Payer: Medicare Other | Admitting: Physical Therapy

## 2017-10-07 ENCOUNTER — Encounter: Payer: Self-pay | Admitting: Physical Therapy

## 2017-10-07 DIAGNOSIS — R262 Difficulty in walking, not elsewhere classified: Secondary | ICD-10-CM | POA: Diagnosis not present

## 2017-10-07 DIAGNOSIS — M6281 Muscle weakness (generalized): Secondary | ICD-10-CM

## 2017-10-07 NOTE — Therapy (Signed)
[place] MAIN [facility] SERVICES [address] [place], [place], [address] Phone: [phone]   Fax:  [phone]  Physical Therapy Treatment  Patient Details  Name: Hannah Howard MRN: [id number] Date of Birth: January 10, 1941 Referring Provider: Fulton Reek D    Encounter Date: 10/07/2017  PT End of Session - 10/07/17 1511    Visit Number  2    Number of Visits  17    Date for PT Re-Evaluation  11/26/17    Authorization Type  2/10 g codes    PT Start Time  0306    PT Stop Time  0345    PT Time Calculation (min)  39 min    Equipment Utilized During Treatment  Gait belt    Activity Tolerance  Patient limited by fatigue;Patient tolerated treatment well    Behavior During Therapy  [facility] for tasks assessed/performed       Past Medical History:  Diagnosis Date  . Anxiety   . Arthritis   . Depression   . GERD (gastroesophageal reflux disease)   . Hyperlipidemia   . Parkinsonism [facility])     Past Surgical History:  Procedure Laterality Date  . BREAST BIOPSY Left 1973   neg  . KNEE ARTHROSCOPY Right 02-27-09  . KNEE ARTHROSCOPY Left 02/28/2011    There were no vitals filed for this visit.  Subjective Assessment - 10/07/17 1510    Subjective  Patient is doing well today just fatigued, no reports of pain.     Patient is accompained by:  Family member    Pertinent History  Patient has been having a slow decline in mobility with ambulation and transfers. She is not able to ambulate intermediate and long distances and needs to rest frquently . She has had 2 falls and has a fear of falling.     Limitations  Standing;Walking;House hold activities    Patient Stated Goals  patient wants to walk longer distances and does not want to keep falling    Currently in Pain?  No/denies    Pain Score  0-No pain    Pain Onset  More than a month ago    Multiple Pain Sites  No       Ther-ex Octane fitnessx21min, fatigue monitored and resistance adjusted  accordingly; Leg press 75 lbs x 20 x 3 sets Single leg heel raises with UE support on // bars x 10 bilateral; Green tband resisted side stepping  X 3  in // bars; Walking tandem on 2 x 4 x 10  1/2 foam flat side down 2 x 10 with intermittent finger touches, notable instability; Quantum single leg press  75 lbs x 15 x 2  on both sides; Side stepping on blue balance beam and tandem stepping blue balance beam Rocker board fwd/bwd and side to side x 20 mins each Standing on purple foam and tapping 6 inch stool x 20 x 2   Pt required cues verbal and demonstration for form and exercise technique. Intermittent finger touches on parallel bars withBOSU squats;                       PT Education - 10/07/17 1511    Education provided  Yes    Education Details  HEP    Person(s) Educated  Patient    Methods  Explanation;Demonstration    Comprehension  Verbalized understanding;Returned demonstration       PT Short Term Goals - 10/01/17 1126  PT SHORT TERM GOAL #1   Title  Patient will increase six minute walk test distance to >500 for progression to community ambulator and improve gait ability     Time  4    Period  Weeks    Status  New    Target Date  10/29/17      PT SHORT TERM GOAL #2   Title  Patient (< 12 years old) will complete five times sit to stand test in < 10 seconds indicating an increased LE strength and improved balance.    Time  4    Period  Weeks    Status  New    Target Date  10/29/17        PT Long Term Goals - 10/01/17 1127      PT LONG TERM GOAL #1   Title  Patient will be independent in home exercise program to improve strength/mobility for better functional independence with ADLs     Time  8    Period  Weeks    Target Date  11/26/17      PT LONG TERM GOAL #2   Title  Patient (< 72 years old) will complete five times sit to stand test in < 10 seconds indicating an increased LE strength and improved balance.    Time  8    Period   Weeks    Status  New    Target Date  11/26/17      PT LONG TERM GOAL #3   Title  Patient will increase six minute walk test distance to >1000 for progression to community ambulator and improve gait ability     Time  8    Period  Weeks    Status  New    Target Date  11/26/17      PT LONG TERM GOAL #4   Title  Patient will increase 10 meter walk test to >1.83m/s as to improve gait speed for better community ambulation and to reduce fall     Time  8    Period  Weeks    Status  New    Target Date  11/26/17            Plan - 10/07/17 1512    Clinical Impression Statement  Continuous verbal cues and tactile cues needed to correct form with exercises and for posture correction. Patient is able to perform strengthening exercises without reports of increased pain. Patient does need CGA during therapeutic exercises for correct from and technique. .Focused on improving standing posture and increasing dynamic standing balance with single leg stand and reaching activities. . Patient will benefit from further skilled therapy to return to prior level of function.    Rehab Potential  Good    PT Frequency  2x / week    PT Duration  8 weeks    PT Treatment/Interventions  Balance training;Therapeutic exercise;Therapeutic activities;Functional mobility training;Stair training;Aquatic Therapy;Patient/family education    PT Next Visit Plan  therapeutic exercise and balance training    PT Home Exercise Plan  seated marching and LAQ    Consulted and Agree with Plan of Care  Patient       Patient will benefit from skilled therapeutic intervention in order to improve the following deficits and impairments:  Abnormal gait, Decreased endurance, Decreased mobility, Difficulty walking, Decreased activity tolerance, Decreased safety awareness, Decreased strength, Pain, Impaired UE functional use, Obesity, Dizziness  Visit Diagnosis: Difficulty in walking, not elsewhere classified  Muscle weakness  (generalized)  Problem List Patient Active Problem List   Diagnosis Date Noted  . Atrophic vaginitis 07/03/2015  . Urinary frequency 07/03/2015  . Urge incontinence 07/03/2015  . Anxiety and depression 07/08/2014  . BP (high blood pressure) 07/08/2014  . HLD (hyperlipidemia) 07/08/2014  . Idiopathic Parkinson's disease ([place]) 07/08/2014  . Parkinson's disease ([place]) 07/08/2014  . DDD (degenerative disc disease), lumbar 04/04/2014  . Neuritis or radiculitis due to rupture of lumbar intervertebral disc 04/04/2014    Alanson Puls, [place] DPT 10/07/2017, 3:21 PM  [place] MAIN [facility] SERVICES [address] [place], [place], [address] Phone: [phone]   Fax:  [phone]  Name: MALINA GEERS MRN: [id number] Date of Birth: January 31, 1941

## 2017-10-09 ENCOUNTER — Ambulatory Visit: Payer: Medicare Other | Admitting: Physical Therapy

## 2017-10-13 ENCOUNTER — Ambulatory Visit: Payer: Medicare Other | Admitting: Physical Therapy

## 2017-10-13 ENCOUNTER — Encounter: Payer: Self-pay | Admitting: Physical Therapy

## 2017-10-13 DIAGNOSIS — M6281 Muscle weakness (generalized): Secondary | ICD-10-CM

## 2017-10-13 DIAGNOSIS — R262 Difficulty in walking, not elsewhere classified: Secondary | ICD-10-CM

## 2017-10-13 NOTE — Therapy (Signed)
[place] MAIN [facility] SERVICES [address] [place], [place], [address] Phone: [phone]   Fax:  [phone]  Physical Therapy Treatment  Patient Details  Name: Hannah Howard MRN: [id number] Date of Birth: [birth year] Referring Provider: Fulton Reek D    Encounter Date: 10/13/2017  PT End of Session - 10/13/17 1114    Visit Number  3    Number of Visits  17    Date for PT Re-Evaluation  11/26/17    Authorization Type  3/10 g codes    PT Start Time  1110    PT Stop Time  1148    PT Time Calculation (min)  38 min    Equipment Utilized During Treatment  Gait belt    Activity Tolerance  Patient limited by fatigue;Patient tolerated treatment well    Behavior During Therapy  [facility] for tasks assessed/performed       Past Medical History:  Diagnosis Date  . Anxiety   . Arthritis   . Depression   . GERD (gastroesophageal reflux disease)   . Hyperlipidemia   . Parkinsonism [facility])     Past Surgical History:  Procedure Laterality Date  . BREAST BIOPSY Left 1973   neg  . KNEE ARTHROSCOPY Right 2010  . KNEE ARTHROSCOPY Left 2012    There were no vitals filed for this visit.    Treatment:  Treadmill walking to focus on toe clearance with gait x 5 min on 1.0 mph;cues for posture   Resisted gait at 7.5lbs - 5 reps fwd/reverse, 3 reps laterally ; cues for posture  Standing ankle DF for improved toe clearance with gait x 30 reps B , cues for intensity   Standing marches with exaggerated hip flexion x 20 B LE, cues for marching higher  Hip ext x 20 reps B LE, cues for posture and technique  Hip ABD x 20 B LE, cues to stay upright   Standing heel raises x 30 reps  Single leg stand with UE assist and step out to the side large steps x 5 each way  Goals reviewed today and updated   Patient needs cues for technique and posture and rest periods for fatigue.                        PT Education -  10/13/17 1114    Education provided  Yes    Education Details  HEP    Person(s) Educated  Patient    Methods  Explanation;Verbal cues    Comprehension  Verbalized understanding;Returned demonstration       PT Short Term Goals - 10/01/17 1126      PT SHORT TERM GOAL #1   Title  Patient will increase six minute walk test distance to >500 for progression to community ambulator and improve gait ability     Time  4    Period  Weeks    Status  New    Target Date  10/29/17      PT SHORT TERM GOAL #2   Title  Patient (< 94 years old) will complete five times sit to stand test in < 10 seconds indicating an increased LE strength and improved balance.    Time  4    Period  Weeks    Status  New    Target Date  10/29/17        PT Long Term Goals - 10/01/17 1127      PT  LONG TERM GOAL #1   Title  Patient will be independent in home exercise program to improve strength/mobility for better functional independence with ADLs     Time  8    Period  Weeks    Target Date  11/26/17      PT LONG TERM GOAL #2   Title  Patient (< 41 years old) will complete five times sit to stand test in < 10 seconds indicating an increased LE strength and improved balance.    Time  8    Period  Weeks    Status  New    Target Date  11/26/17      PT LONG TERM GOAL #3   Title  Patient will increase six minute walk test distance to >1000 for progression to community ambulator and improve gait ability     Time  8    Period  Weeks    Status  New    Target Date  11/26/17      PT LONG TERM GOAL #4   Title  Patient will increase 10 meter walk test to >1.69m/s as to improve gait speed for better community ambulation and to reduce fall     Time  8    Period  Weeks    Status  New    Target Date  11/26/17            Plan - 10/13/17 1115    Clinical Impression Statement  Instructed patient in advanced LE strengthening and balance exercise; Patient requires min Vcs for correct exercise technique including  to slow down LE movement and increase ROM for better strengthening; Patient demonstrates better tandem stance being able to maintain on uneven surface with CGA;  Patient continues to have trouble with SLS being able to hold only 3-5 sec each LE; Patient would benefit from additional skilled PT Intervention to improve LE strength, balance and gait safety and return to PLOF.    Rehab Potential  Good    PT Frequency  2x / week    PT Duration  8 weeks    PT Treatment/Interventions  Balance training;Therapeutic exercise;Therapeutic activities;Functional mobility training;Stair training;Aquatic Therapy;Patient/family education    PT Next Visit Plan  therapeutic exercise and balance training    PT Home Exercise Plan  seated marching and LAQ    Consulted and Agree with Plan of Care  Patient       Patient will benefit from skilled therapeutic intervention in order to improve the following deficits and impairments:  Abnormal gait, Decreased endurance, Decreased mobility, Difficulty walking, Decreased activity tolerance, Decreased safety awareness, Decreased strength, Pain, Impaired UE functional use, Obesity, Dizziness  Visit Diagnosis: Difficulty in walking, not elsewhere classified  Muscle weakness (generalized)     Problem List Patient Active Problem List   Diagnosis Date Noted  . Atrophic vaginitis 07/03/2015  . Urinary frequency 07/03/2015  . Urge incontinence 07/03/2015  . Anxiety and depression 07/08/2014  . BP (high blood pressure) 07/08/2014  . HLD (hyperlipidemia) 07/08/2014  . Idiopathic Parkinson's disease ([place]) 07/08/2014  . Parkinson's disease ([place]) 07/08/2014  . DDD (degenerative disc disease), lumbar 04/04/2014  . Neuritis or radiculitis due to rupture of lumbar intervertebral disc 04/04/2014    Alanson Puls, [place] DPT 10/13/2017, 11:18 AM  [place] MAIN [facility] SERVICES [address] [place], [place], [address] Phone:  [phone]   Fax:  [phone]  Name: Hannah Howard MRN: [id number] Date of Birth: 06/01/1941

## 2017-10-14 ENCOUNTER — Ambulatory Visit
Admission: RE | Admit: 2017-10-14 | Discharge: 2017-10-14 | Disposition: A | Discharge status: Home / Self Care | Payer: Medicare Other | Source: Ambulatory Visit | Attending: Unknown Physician Specialty | Admitting: Unknown Physician Specialty

## 2017-10-14 DIAGNOSIS — E041 Nontoxic single thyroid nodule: Secondary | ICD-10-CM | POA: Diagnosis present

## 2017-10-14 NOTE — Discharge Instructions (Signed)
Thyroid Biopsy, Care After °Refer to this sheet in the next few weeks. These instructions provide you with information on caring for yourself after your procedure. Your health care provider may also give you more specific instructions. Your treatment has been planned according to current medical practices, but problems sometimes occur. Call your health care provider if you have any problems or questions after your procedure. °What can I expect after the procedure? °After your procedure, it is typical to have the following: °· You may have soreness and tenderness at the biopsy site for a few days. °· You may have a sore throat or a hoarse voice if you had an open biopsy. This should go away after a couple days. ° °Follow these instructions at home: °· Take medicines only as directed by your health care provider. °· To ease discomfort at the biopsy site: °? Keep your head raised on a pillow when you are lying down. °? Support the back of your head and neck with both hands as you sit up from a lying position. °· If you have a sore throat, try using throat lozenges or gargling with warm salt water. °· Keep all follow-up visits as directed by your health care provider. This is important. °Contact a health care provider if: °· You have a fever. °Get help right away if: °· You have severe bleeding from the biopsy site. °· You have difficulty swallowing. °· You have drainage, redness, swelling, or pain at the biopsy site. °· You have swollen glands (lymph nodes) in your neck. °This information is not intended to replace advice given to you by your health care provider. Make sure you discuss any questions you have with your health care provider. °Document Released: 06/08/2014 Document Revised: 07/14/2016 Document Reviewed: 02/03/2014 °Elsevier Interactive Patient Education © 2018 [employer] ° °

## 2017-10-15 ENCOUNTER — Ambulatory Visit: Payer: Medicare Other | Admitting: Physical Therapy

## 2017-10-20 ENCOUNTER — Ambulatory Visit: Payer: Medicare Other | Admitting: Physical Therapy

## 2017-10-20 LAB — CYTOLOGY - NON PAP

## 2017-10-21 ENCOUNTER — Other Ambulatory Visit: Payer: Self-pay | Admitting: Unknown Physician Specialty

## 2017-10-21 DIAGNOSIS — E041 Nontoxic single thyroid nodule: Secondary | ICD-10-CM

## 2017-10-22 ENCOUNTER — Ambulatory Visit: Payer: Medicare Other | Admitting: Physical Therapy

## 2017-10-23 ENCOUNTER — Encounter: Payer: Self-pay | Admitting: Physical Therapy

## 2017-10-23 ENCOUNTER — Ambulatory Visit: Payer: Medicare Other | Admitting: Physical Therapy

## 2017-10-23 DIAGNOSIS — R262 Difficulty in walking, not elsewhere classified: Secondary | ICD-10-CM | POA: Diagnosis not present

## 2017-10-23 DIAGNOSIS — M6281 Muscle weakness (generalized): Secondary | ICD-10-CM

## 2017-10-23 NOTE — Therapy (Signed)
[place] MAIN [facility] SERVICES [address] [place], [place], [address] Phone: [phone]   Fax:  [phone]  Physical Therapy Treatment  Patient Details  Name: Hannah Howard MRN: [id number] Date of Birth: 1941/01/31 Referring Provider: Fulton Reek D    Encounter Date: 10/23/2017  PT End of Session - 10/23/17 1151    Visit Number  4    Number of Visits  17    Date for PT Re-Evaluation  11/26/17    Authorization Type  4/10 g codes    PT Start Time  4403    PT Stop Time  1230    PT Time Calculation (min)  45 min    Equipment Utilized During Treatment  Gait belt    Activity Tolerance  Patient limited by fatigue;Patient tolerated treatment well    Behavior During Therapy  [facility] for tasks assessed/performed       Past Medical History:  Diagnosis Date  . Anxiety   . Arthritis   . Depression   . GERD (gastroesophageal reflux disease)   . Hyperlipidemia   . Parkinsonism [facility])     Past Surgical History:  Procedure Laterality Date  . BREAST BIOPSY Left 1973   neg  . KNEE ARTHROSCOPY Right 2010  . KNEE ARTHROSCOPY Left 2012    There were no vitals filed for this visit.  Subjective Assessment - 10/23/17 1150    Subjective  Patient is doing well today just fatigued, no reports of pain. No new concerns.    Patient is accompained by:  Family member    Pertinent History  Patient has been having a slow decline in mobility with ambulation and transfers. She is not able to ambulate intermediate and long distances and needs to rest frquently . She has had 2 falls and has a fear of falling.     Limitations  Standing;Walking;House hold activities    Patient Stated Goals  patient wants to walk longer distances and does not want to keep falling    Currently in Pain?  No/denies    Pain Score  0-No pain    Pain Onset  More than a month ago    Multiple Pain Sites  No      Treatment: Octane fitnessx80min, fatigue monitored and resistance  adjusted accordingly; Leg press 75 lbs x 20 x 3 sets Single leg heel raises with UE support on // bars x 10 bilateral; Green tband resisted side stepping  X 3  in // bars; Walking tandem on 2 x 4 x 10 1/2 foam flat side down 2 x 10 with intermittent finger touches, notable instability; Quantum single leg press75 lbs x 15x 2on both sides; Side stepping on blue balance beam and tandem stepping blue balance beam Rocker board fwd/bwd and side to side x 20 mins each Standing on purple foam and tapping 6 inch stool x 20 x 2   Pt required cues verbal and demonstration for form and exercise technique. Intermittent finger touches on parallel bars withBOSU squats;                       PT Education - 10/23/17 1150    Education provided  Yes    Education Details  HEP    Person(s) Educated  Patient    Methods  Explanation    Comprehension  Verbalized understanding       PT Short Term Goals - 10/01/17 1126      PT SHORT TERM  GOAL #1   Title  Patient will increase six minute walk test distance to >500 for progression to community ambulator and improve gait ability     Time  4    Period  Weeks    Status  New    Target Date  10/29/17      PT SHORT TERM GOAL #2   Title  Patient (< 75 years old) will complete five times sit to stand test in < 10 seconds indicating an increased LE strength and improved balance.    Time  4    Period  Weeks    Status  New    Target Date  10/29/17        PT Long Term Goals - 10/01/17 1127      PT LONG TERM GOAL #1   Title  Patient will be independent in home exercise program to improve strength/mobility for better functional independence with ADLs     Time  8    Period  Weeks    Target Date  11/26/17      PT LONG TERM GOAL #2   Title  Patient (< 75 years old) will complete five times sit to stand test in < 10 seconds indicating an increased LE strength and improved balance.    Time  8    Period  Weeks    Status  New     Target Date  11/26/17      PT LONG TERM GOAL #3   Title  Patient will increase six minute walk test distance to >1000 for progression to community ambulator and improve gait ability     Time  8    Period  Weeks    Status  New    Target Date  11/26/17      PT LONG TERM GOAL #4   Title  Patient will increase 10 meter walk test to >1.83m/s as to improve gait speed for better community ambulation and to reduce fall     Time  8    Period  Weeks    Status  New    Target Date  11/26/17            Plan - 10/23/17 1152    Clinical Impression Statement  Continuous verbal cues and tactile cues needed to correct form with exercises and for posture correction. Patient needs occasional UE for support during standing balance exercises.   Patient is able to perform strengthening exercises without reports of pain. Patient does need supervision during therapeutic exercises for correct from and technique. Patient will benefit from further skilled therapy to return to prior level of function.    Rehab Potential  Good    PT Frequency  2x / week    PT Duration  8 weeks    PT Treatment/Interventions  Balance training;Therapeutic exercise;Therapeutic activities;Functional mobility training;Stair training;Aquatic Therapy;Patient/family education    PT Next Visit Plan  therapeutic exercise and balance training    PT Home Exercise Plan  seated marching and LAQ    Consulted and Agree with Plan of Care  Patient       Patient will benefit from skilled therapeutic intervention in order to improve the following deficits and impairments:  Abnormal gait, Decreased endurance, Decreased mobility, Difficulty walking, Decreased activity tolerance, Decreased safety awareness, Decreased strength, Pain, Impaired UE functional use, Obesity, Dizziness  Visit Diagnosis: Difficulty in walking, not elsewhere classified  Muscle weakness (generalized)     Problem List Patient Active Problem List  Diagnosis Date Noted   . Atrophic vaginitis 07/03/2015  . Urinary frequency 07/03/2015  . Urge incontinence 07/03/2015  . Anxiety and depression 07/08/2014  . BP (high blood pressure) 07/08/2014  . HLD (hyperlipidemia) 07/08/2014  . Idiopathic Parkinson's disease ([place]) 07/08/2014  . Parkinson's disease ([place]) 07/08/2014  . DDD (degenerative disc disease), lumbar 04/04/2014  . Neuritis or radiculitis due to rupture of lumbar intervertebral disc 04/04/2014    Alanson Puls, [place] DPT 10/23/2017, 11:57 AM  [place] MAIN [facility] SERVICES [address] [place], [place], [address] Phone: [phone]   Fax:  [phone]  Name: REAGEN GOATES MRN: [id number] Date of Birth: 06-12-1941

## 2017-10-27 ENCOUNTER — Encounter: Payer: Self-pay | Admitting: Physical Therapy

## 2017-10-27 ENCOUNTER — Other Ambulatory Visit: Payer: Self-pay

## 2017-10-27 ENCOUNTER — Ambulatory Visit: Payer: Medicare Other | Admitting: Physical Therapy

## 2017-10-27 ENCOUNTER — Ambulatory Visit: Payer: Medicare Other | Attending: Internal Medicine | Admitting: Physical Therapy

## 2017-10-27 VITALS — BP 137/59 | HR 85

## 2017-10-27 DIAGNOSIS — R262 Difficulty in walking, not elsewhere classified: Secondary | ICD-10-CM

## 2017-10-27 DIAGNOSIS — M6281 Muscle weakness (generalized): Secondary | ICD-10-CM | POA: Diagnosis present

## 2017-10-27 NOTE — Therapy (Signed)
[place] MAIN [facility] SERVICES [address] [place], [place], [address] Phone: [phone]   Fax:  [phone]  Physical Therapy Treatment  Patient Details  Name: Hannah Howard MRN: [id number] Date of Birth: Oct 29, 1941 Referring Provider: Fulton Reek D    Encounter Date: 10/27/2017  PT End of Session - 10/27/17 1256    Visit Number  5    Number of Visits  17    Date for PT Re-Evaluation  11/26/17    Authorization Type  5/10 g codes    PT Start Time  6962    PT Stop Time  1344    PT Time Calculation (min)  45 min    Equipment Utilized During Treatment  Gait belt    Activity Tolerance  Patient limited by fatigue;Patient tolerated treatment well    Behavior During Therapy  [facility] for tasks assessed/performed       Past Medical History:  Diagnosis Date  . Anxiety   . Arthritis   . Depression   . GERD (gastroesophageal reflux disease)   . Hyperlipidemia   . Parkinsonism [facility])     Past Surgical History:  Procedure Laterality Date  . BREAST BIOPSY Left 1973   neg  . KNEE ARTHROSCOPY Right 2010  . KNEE ARTHROSCOPY Left 2012    Vitals:   10/27/17 1306  BP: (!) 137/59  Pulse: 85    Subjective Assessment - 10/27/17 1305    Subjective  Pt reports her last fall was 2 months ago.  She still reports feeling unsteady and having some days that are worse than others.  No new complaints or concerns.      Patient is accompained by:  Family member    Pertinent History  Patient has been having a slow decline in mobility with ambulation and transfers. She is not able to ambulate intermediate and long distances and needs to rest frquently . She has had 2 falls and has a fear of falling.     Limitations  Standing;Walking;House hold activities    Patient Stated Goals  patient wants to walk longer distances and does not want to keep falling    Currently in Pain?  No/denies        TREATMENT  Neuromuscular Re-ed:  Rockerboard forward/back  then side to side x1 minute each direction  Rhomberg stance on airex pad with ball toss x20 to each side  Alternating toe taps up to 8" step from airex pad x10 each LE  Marching on airex x15 each LE with intermittent UE support    Therapeutic Exercise:  Lateral step ups to 8" steps x10 each side with intermittent UE support  Sit<>stand with 1UE to push into standing. Cues to scoot to edge of seat and for knee flexion for greater mechanical advantage. Focus on powering up to standing. x10  Seated Bil hip abd/ER x20  Leg press 75 lbs x 15, 90 lbs x20, 105 lbs x15  Ambulating in hallway with spontaneous vertical and horizontal head turns x100 ft  Ambulating in hallway with spontaneous direction changes L, R, forward, back x50 ft      PT Education - 10/27/17 1256    Education provided  Yes    Education Details  Exercise technique    Person(s) Educated  Patient    Methods  Explanation;Demonstration;Verbal cues    Comprehension  Verbalized understanding;Returned demonstration;Verbal cues required;Need further instruction       PT Short Term Goals - 10/01/17 1126  PT SHORT TERM GOAL #1   Title  Patient will increase six minute walk test distance to >500 for progression to community ambulator and improve gait ability     Time  4    Period  Weeks    Status  New    Target Date  10/29/17      PT SHORT TERM GOAL #2   Title  Patient (< 71 years old) will complete five times sit to stand test in < 10 seconds indicating an increased LE strength and improved balance.    Time  4    Period  Weeks    Status  New    Target Date  10/29/17        PT Long Term Goals - 10/01/17 1127      PT LONG TERM GOAL #1   Title  Patient will be independent in home exercise program to improve strength/mobility for better functional independence with ADLs     Time  8    Period  Weeks    Target Date  11/26/17      PT LONG TERM GOAL #2   Title  Patient (< 8 years old) will complete five times sit  to stand test in < 10 seconds indicating an increased LE strength and improved balance.    Time  8    Period  Weeks    Status  New    Target Date  11/26/17      PT LONG TERM GOAL #3   Title  Patient will increase six minute walk test distance to >1000 for progression to community ambulator and improve gait ability     Time  8    Period  Weeks    Status  New    Target Date  11/26/17      PT LONG TERM GOAL #4   Title  Patient will increase 10 meter walk test to >1.39m/s as to improve gait speed for better community ambulation and to reduce fall     Time  8    Period  Weeks    Status  New    Target Date  11/26/17            Plan - 10/27/17 1330    Clinical Impression Statement  Pt demonstrated instability with SLS activities, requiring intermittent assist.  She fatigues quickly with strengthening exercises but tolerated all interventions well.  Encouraged pt to work on her ambulatory endurance inside her condo as she has a fear of falling outside of her condo.  Pt is potentially interested in attending the Parkinson's class at [facility] when she is done with PT, will follow up with patient and provide her with resources.  Pt will benefit from continued skilled PT interventions for improved strength and balance.     Rehab Potential  Good    PT Frequency  2x / week    PT Duration  8 weeks    PT Treatment/Interventions  Balance training;Therapeutic exercise;Therapeutic activities;Functional mobility training;Stair training;Aquatic Therapy;Patient/family education    PT Next Visit Plan  therapeutic exercise and balance training    PT Home Exercise Plan  seated marching and LAQ    Consulted and Agree with Plan of Care  Patient       Patient will benefit from skilled therapeutic intervention in order to improve the following deficits and impairments:  Abnormal gait, Decreased endurance, Decreased mobility, Difficulty walking, Decreased activity tolerance, Decreased safety awareness, Decreased  strength, Pain, Impaired UE functional use, Obesity,  Dizziness  Visit Diagnosis: Difficulty in walking, not elsewhere classified  Muscle weakness (generalized)     Problem List Patient Active Problem List   Diagnosis Date Noted  . Atrophic vaginitis 07/03/2015  . Urinary frequency 07/03/2015  . Urge incontinence 07/03/2015  . Anxiety and depression 07/08/2014  . BP (high blood pressure) 07/08/2014  . HLD (hyperlipidemia) 07/08/2014  . Idiopathic Parkinson's disease ([place]) 07/08/2014  . Parkinson's disease ([place]) 07/08/2014  . DDD (degenerative disc disease), lumbar 04/04/2014  . Neuritis or radiculitis due to rupture of lumbar intervertebral disc 04/04/2014     Collie Siad PT, DPT 10/27/2017, 1:45 PM  [place] MAIN [facility] SERVICES [address] [place], [place], [address] Phone: [phone]   Fax:  [phone]  Name: Hannah Howard MRN: [id number] Date of Birth: Jun 03, 1941

## 2017-10-29 ENCOUNTER — Encounter: Payer: Self-pay | Admitting: Physical Therapy

## 2017-10-29 ENCOUNTER — Ambulatory Visit: Payer: Medicare Other | Admitting: Physical Therapy

## 2017-10-29 DIAGNOSIS — R262 Difficulty in walking, not elsewhere classified: Secondary | ICD-10-CM

## 2017-10-29 DIAGNOSIS — M6281 Muscle weakness (generalized): Secondary | ICD-10-CM

## 2017-10-29 NOTE — Therapy (Signed)
[place] MAIN [facility] SERVICES [address] [place], [place], [address] Phone: [phone]   Fax:  [phone]  Physical Therapy Treatment  Patient Details  Name: Hannah Howard MRN: [id number] Date of Birth: 12-Mar-1941 Referring Provider: Fulton Reek D    Encounter Date: 10/29/2017  PT End of Session - 10/29/17 1020    Visit Number  6    Number of Visits  17    Date for PT Re-Evaluation  11/26/17    Authorization Type  6/10 g codes    PT Start Time  7619    PT Stop Time  1055    PT Time Calculation (min)  40 min    Equipment Utilized During Treatment  Gait belt    Activity Tolerance  Patient limited by fatigue;Patient tolerated treatment well    Behavior During Therapy  [facility] for tasks assessed/performed       Past Medical History:  Diagnosis Date  . Anxiety   . Arthritis   . Depression   . GERD (gastroesophageal reflux disease)   . Hyperlipidemia   . Parkinsonism [facility])     Past Surgical History:  Procedure Laterality Date  . BREAST BIOPSY Left 1973   neg  . KNEE ARTHROSCOPY Right 2010  . KNEE ARTHROSCOPY Left 2012    There were no vitals filed for this visit.  Subjective Assessment - 10/29/17 1019    Subjective  She still reports feeling unsteady and having some days that are worse than others.  No new complaints or concerns.      Patient is accompained by:  Family member    Pertinent History  Patient has been having a slow decline in mobility with ambulation and transfers. She is not able to ambulate intermediate and long distances and needs to rest frquently . She has had 2 falls and has a fear of falling.     Limitations  Standing;Walking;House hold activities    Patient Stated Goals  patient wants to walk longer distances and does not want to keep falling    Currently in Pain?  No/denies    Pain Score  0-No pain    Pain Onset  More than a month ago    Multiple Pain Sites  No       Neuromuscular training: Tandem  stand with head turns x 2 minutes Side stepping with head control Stepping onto AIREX, then on to 4 inch step followed by stepping down onto AIREX and then level surface in //bars x15.  Pt required occasional UE assist, and performance improved with each repetition   Stepping over and back x10 bilaterally   Stepping over and back x`10 bilaterally with verbal cueing to perform exercise as fast as possible Side step and back x10 bilaterally Side step and back x10 bilaterally with verbal cueing to perform exercise as fast as possible Side stepping on blue foam beam x 5  There ex: Leg press with 90#x 10, followed by 100# x10 and then 120# x10  sit to stand 2x10   Mini squats in //bars x Patient continues to demonstrates less incoordination of movement with select exercises such as side stepping on blue foam and tandem with head turns.  Patient responds well to verbal and tactile cues to correct form and technique. Patient is able to perform better technique with correct positions  Motor control of LE much improved.  Muscle fatigue but no major pain complaints.  PT Education - 10/29/17 1020    Education provided  Yes    Education Details  HEP    Person(s) Educated  Patient    Methods  Explanation    Comprehension  Verbalized understanding       PT Short Term Goals - 10/01/17 1126      PT SHORT TERM GOAL #1   Title  Patient will increase six minute walk test distance to >500 for progression to community ambulator and improve gait ability     Time  4    Period  Weeks    Status  New    Target Date  10/29/17      PT SHORT TERM GOAL #2   Title  Patient (< 21 years old) will complete five times sit to stand test in < 10 seconds indicating an increased LE strength and improved balance.    Time  4    Period  Weeks    Status  New    Target Date  10/29/17        PT Long Term Goals - 10/01/17 1127      PT LONG TERM GOAL #1   Title  Patient  will be independent in home exercise program to improve strength/mobility for better functional independence with ADLs     Time  8    Period  Weeks    Target Date  11/26/17      PT LONG TERM GOAL #2   Title  Patient (< 47 years old) will complete five times sit to stand test in < 10 seconds indicating an increased LE strength and improved balance.    Time  8    Period  Weeks    Status  New    Target Date  11/26/17      PT LONG TERM GOAL #3   Title  Patient will increase six minute walk test distance to >1000 for progression to community ambulator and improve gait ability     Time  8    Period  Weeks    Status  New    Target Date  11/26/17      PT LONG TERM GOAL #4   Title  Patient will increase 10 meter walk test to >1.5m/s as to improve gait speed for better community ambulation and to reduce fall     Time  8    Period  Weeks    Status  New    Target Date  11/26/17            Plan - 10/29/17 1022    Clinical Impression Statement  Instructed patient in advanced LE strengthening and balance exercise; Patient requires min Vcs for correct exercise technique including to slow down LE movement and increase ROM for better strengthening; Patient demonstrates better tandem stance being able to maintain on uneven surface with CGA; Patient continues to have trouble with SLS being able to hold only 3-5 sec each LE; Patient would benefit from additional skilled PT Intervention to improve LE strength, balance and gait safety and return to PLOF.    Rehab Potential  Good    PT Frequency  2x / week    PT Duration  8 weeks    PT Treatment/Interventions  Balance training;Therapeutic exercise;Therapeutic activities;Functional mobility training;Stair training;Aquatic Therapy;Patient/family education    PT Next Visit Plan  therapeutic exercise and balance training    PT Home Exercise Plan  seated marching and LAQ    Consulted and Agree with Plan of  Care  Patient       Patient will benefit  from skilled therapeutic intervention in order to improve the following deficits and impairments:  Abnormal gait, Decreased endurance, Decreased mobility, Difficulty walking, Decreased activity tolerance, Decreased safety awareness, Decreased strength, Pain, Impaired UE functional use, Obesity, Dizziness  Visit Diagnosis: Difficulty in walking, not elsewhere classified  Muscle weakness (generalized)     Problem List Patient Active Problem List   Diagnosis Date Noted  . Atrophic vaginitis 07/03/2015  . Urinary frequency 07/03/2015  . Urge incontinence 07/03/2015  . Anxiety and depression 07/08/2014  . BP (high blood pressure) 07/08/2014  . HLD (hyperlipidemia) 07/08/2014  . Idiopathic Parkinson's disease ([place]) 07/08/2014  . Parkinson's disease ([place]) 07/08/2014  . DDD (degenerative disc disease), lumbar 04/04/2014  . Neuritis or radiculitis due to rupture of lumbar intervertebral disc 04/04/2014    Alanson Puls, [place] DPT 10/29/2017, 10:24 AM  [place] MAIN [facility] SERVICES [address] [place], [place], [address] Phone: [phone]   Fax:  [phone]  Name: DANEE SOLLER MRN: [id number] Date of Birth: [birth year]

## 2017-11-03 ENCOUNTER — Ambulatory Visit: Payer: Medicare Other | Admitting: Physical Therapy

## 2017-11-05 ENCOUNTER — Ambulatory Visit: Payer: Medicare Other | Admitting: Physical Therapy

## 2017-11-11 ENCOUNTER — Ambulatory Visit: Payer: Medicare Other | Admitting: Physical Therapy

## 2017-11-20 ENCOUNTER — Ambulatory Visit: Payer: Medicare Other | Admitting: Physical Therapy

## 2017-11-27 ENCOUNTER — Ambulatory Visit: Payer: Medicare Other | Admitting: Physical Therapy

## 2017-12-01 ENCOUNTER — Ambulatory Visit: Payer: Medicare Other | Attending: Internal Medicine | Admitting: Physical Therapy

## 2017-12-01 ENCOUNTER — Encounter: Payer: Self-pay | Admitting: Physical Therapy

## 2017-12-01 DIAGNOSIS — M6281 Muscle weakness (generalized): Secondary | ICD-10-CM | POA: Diagnosis present

## 2017-12-01 DIAGNOSIS — R262 Difficulty in walking, not elsewhere classified: Secondary | ICD-10-CM | POA: Diagnosis present

## 2017-12-01 NOTE — Therapy (Addendum)
[place] MAIN [facility] SERVICES [address] [place], [place], [address] Phone: [phone]   Fax:  [phone]  Physical Therapy Treatment  Patient Details  Name: Hannah Howard MRN: [id number] Date of Birth: 06/10/1941 Referring Provider: Fulton Reek D    Encounter Date: 12/01/2017  PT End of Session - 12/01/17 1311    Visit Number  7    Number of Visits  17    Date for PT Re-Evaluation  01/21/18    Authorization Type  8/10 g codes    PT Start Time  0105    PT Stop Time  0145    PT Time Calculation (min)  40 min    Equipment Utilized During Treatment  Gait belt    Activity Tolerance  Patient limited by fatigue;Patient tolerated treatment well    Behavior During Therapy  [facility] for tasks assessed/performed       Past Medical History:  Diagnosis Date  . Anxiety   . Arthritis   . Depression   . GERD (gastroesophageal reflux disease)   . Hyperlipidemia   . Parkinsonism [facility])     Past Surgical History:  Procedure Laterality Date  . BREAST BIOPSY Left 1973   neg  . KNEE ARTHROSCOPY Right 2010  . KNEE ARTHROSCOPY Left 2012    There were no vitals filed for this visit.  Subjective Assessment - 12/01/17 1307    Subjective  She still reports feeling unsteady and having some days that are worse than others. She reports a fall several weeks ago. She now has a life alert.     Patient is accompained by:  Family member    Pertinent History  Patient has been having a slow decline in mobility with ambulation and transfers. She is not able to ambulate intermediate and long distances and needs to rest frquently . She has had 2 falls and has a fear of falling.     Limitations  Standing;Walking;House hold activities    Patient Stated Goals  patient wants to walk longer distances and does not want to keep falling    Currently in Pain?  Yes    Pain Score  8     Pain Location  -- head to toe    Pain Descriptors / Indicators  Aching    Pain Type   Chronic pain    Pain Onset  More than a month ago    Pain Frequency  Intermittent      Treatment: TUG test performed 24.87sec Performed outcome measures and reviewed goals for PT. Patient did not progress with her goals and plan of care was discussed as well as need to perform HEP Octane fitness for warm up followed by review of outcome measures. Including 6 MW walk test, 5 x sit to stand and 10 MW Neuromuscular training: Standing foam tapping to 6 inch stool x 20 ; foot catching several times descending due to fatigue. Rocker board tandem with feet apart rocking x 20 with left foot and then with right foot ahead Standing on foam with yellow theraball and trunk rotation left and right x 20 with cues to keep her elbows straight, and follow with her head Patient reports that she is having " all over body pain " today Patient needs verbal cues moderate to stay on task and for correct posture.                         PT Education -  12/01/17 1309    Education provided  Yes    Education Details  HEP    Person(s) Educated  Patient    Methods  Explanation    Comprehension  Verbalized understanding       PT Short Term Goals - 12/01/17 1329      PT SHORT TERM GOAL #1   Title  Patient will increase six minute walk test distance to >500 for progression to community ambulator and improve gait ability     Baseline  450 ft    Time  4    Period  Weeks    Status  New    Target Date  10/29/17      PT SHORT TERM GOAL #2   Title  Patient (< 64 years old) will complete five times sit to stand test in < 10 seconds indicating an increased LE strength and improved balance.    Baseline  27.95 sec    Time  4    Period  Weeks    Status  On-going        PT Long Term Goals - 12/01/17 1313      PT LONG TERM GOAL #1   Title  Patient will be independent in home exercise program to improve strength/mobility for better functional independence with ADLs     Baseline  Patient  fell and stopped her HEP     Time  8    Period  Weeks    Status  On-going    Target Date  01/21/18      PT LONG TERM GOAL #2   Title  Patient (< 23 years old) will complete five times sit to stand test in < 10 seconds indicating an increased LE strength and improved balance.    Baseline  27.95 sec 12/01/17    Time  8    Period  Weeks    Status  Partially Met    Target Date  01/21/18      PT LONG TERM GOAL #3   Title  Patient will increase six minute walk test distance to >1000 for progression to community ambulator and improve gait ability     Baseline  450 feet 12/01/17    Time  8    Period  Weeks    Status  New    Target Date  01/21/18      PT LONG TERM GOAL #4   Title  Patient will increase 10 meter walk test to >1.66ms as to improve gait speed for better community ambulation and to reduce fall     Baseline  . 57 m/sec 12/01/17    Time  8    Period  Weeks    Status  New    Target Date  01/21/18            Plan - 12/01/17 1311    Clinical Impression Statement  Patient performs outcome meausres and is not progressing towards goals She had a fall and has increased anxiousness She reports a recent fall and has a life alert. Continuous verbal cues and tactile cues needed to correct form with exercises and for posture correction. Patient is able to perform strengthening exercises without reports of increased pain. Patient does need supervision during therapeutic exercises for correct from and technique.. Patient will benefit from further skilled therapy to return to prior level of function.She has decreased gait speed, weakness and decreased static and dynamic standing balance. She was educated about needing to be consistent with her  HEP. She will continue skilled PT for 4 more weeks to try and progress towards goals and improve mobility and safety.    Rehab Potential  Good    PT Frequency  2x / week    PT Duration  8 weeks    PT Treatment/Interventions  Balance training;Therapeutic  exercise;Therapeutic activities;Functional mobility training;Stair training;Aquatic Therapy;Patient/family education    PT Next Visit Plan  therapeutic exercise and balance training    PT Home Exercise Plan  seated marching and LAQ    Consulted and Agree with Plan of Care  Patient       Patient will benefit from skilled therapeutic intervention in order to improve the following deficits and impairments:  Abnormal gait, Decreased endurance, Decreased mobility, Difficulty walking, Decreased activity tolerance, Decreased safety awareness, Decreased strength, Pain, Impaired UE functional use, Obesity, Dizziness  Visit Diagnosis: Difficulty in walking, not elsewhere classified  Muscle weakness (generalized)     Problem List Patient Active Problem List   Diagnosis Date Noted  . Atrophic vaginitis 07/03/2015  . Urinary frequency 07/03/2015  . Urge incontinence 07/03/2015  . Anxiety and depression 07/08/2014  . BP (high blood pressure) 07/08/2014  . HLD (hyperlipidemia) 07/08/2014  . Idiopathic Parkinson's disease ([place]) 07/08/2014  . Parkinson's disease ([place]) 07/08/2014  . DDD (degenerative disc disease), lumbar 04/04/2014  . Neuritis or radiculitis due to rupture of lumbar intervertebral disc 04/04/2014    Alanson Puls, [place] DPT 12/01/2017, 4:08 PM  [place] MAIN [facility] SERVICES [address] [place], [place], [address] Phone: [phone]   Fax:  [phone]  Name: Hannah Howard MRN: [id number] Date of Birth: 08/10/1941

## 2017-12-03 ENCOUNTER — Ambulatory Visit: Payer: Medicare Other | Admitting: Physical Therapy

## 2017-12-03 ENCOUNTER — Encounter: Payer: Self-pay | Admitting: Physical Therapy

## 2017-12-03 DIAGNOSIS — R262 Difficulty in walking, not elsewhere classified: Secondary | ICD-10-CM | POA: Diagnosis not present

## 2017-12-03 NOTE — Therapy (Addendum)
[place] MAIN [facility] SERVICES [address] [place], [place], [address] Phone: [phone]   Fax:  [phone]  Physical Therapy Treatment  Patient Details  Name: Hannah Howard MRN: [id number] Date of Birth: May 09, 1941 Referring Provider: Fulton Reek D    Encounter Date: 12/03/2017  PT End of Session - 12/03/17 1416    Visit Number  8    Number of Visits  17    Date for PT Re-Evaluation  01/21/18    Authorization Type  8/10 g codes    PT Start Time  0205    PT Stop Time  0245    PT Time Calculation (min)  40 min    Equipment Utilized During Treatment  Gait belt    Activity Tolerance  Patient limited by fatigue;Patient tolerated treatment well    Behavior During Therapy  [facility] for tasks assessed/performed       Past Medical History:  Diagnosis Date  . Anxiety   . Arthritis   . Depression   . GERD (gastroesophageal reflux disease)   . Hyperlipidemia   . Parkinsonism [facility])     Past Surgical History:  Procedure Laterality Date  . BREAST BIOPSY Left 1973   neg  . KNEE ARTHROSCOPY Right 01/28/2009  . KNEE ARTHROSCOPY Left 01/28/11    There were no vitals filed for this visit.  Subjective Assessment - 12/03/17 1415    Subjective  [name] is doing fine today with no new concerns. She is not feeling as bad as the other day.     Patient is accompained by:  Family member    Pertinent History  Patient has been having a slow decline in mobility with ambulation and transfers. She is not able to ambulate intermediate and long distances and needs to rest frquently . She has had 2 falls and has a fear of falling.     Limitations  Standing;Walking;House hold activities    Patient Stated Goals  patient wants to walk longer distances and does not want to keep falling    Currently in Pain?  No/denies    Pain Score  0-No pain    Pain Onset  More than a month ago    Multiple Pain Sites  No        Treatment:  Side stepping on blue balance beam  left and right 10 feet in parallel bars with no UE support x 5 reps   Tandem stand flat surfaces and hold 30 sec ( too hard)   Modified tandem stand on flat surface and hold for 30 sec x 10 reps  Marching in place on blue foam pad x 30 seconds for 2 sets    Toe taps on AIREX + tapping  3x10 no UE, needs occasional UE support  X 10 x 2  Fwd step up onto 6inch step from  Flat surface  no UE x 10   Fwd step up onto 4inch step from air ex with  no UE x 10 each side CGA   Side  step up onto 4inch step from air ex with  no UE x 10 each side CGA , left and right x 10    Patient needs CGA for all balance tasks and cues for posture and correct technique. She has fatigue with standing exercises and has difficulty with taking larger steps.                      PT Education - 12/03/17  26    Education provided  Yes    Education Details  saftey with steps and transfers    Person(s) Educated  Patient    Methods  Explanation    Comprehension  Verbalized understanding       PT Short Term Goals - 12/01/17 1329      PT SHORT TERM GOAL #1   Title  Patient will increase six minute walk test distance to >500 for progression to community ambulator and improve gait ability     Baseline  450 ft    Time  4    Period  Weeks    Status  New    Target Date  10/29/17      PT SHORT TERM GOAL #2   Title  Patient (< 90 years old) will complete five times sit to stand test in < 10 seconds indicating an increased LE strength and improved balance.    Baseline  27.95 sec    Time  4    Period  Weeks    Status  On-going        PT Long Term Goals - 12/01/17 1313      PT LONG TERM GOAL #1   Title  Patient will be independent in home exercise program to improve strength/mobility for better functional independence with ADLs     Baseline  Patient fell and stopped her HEP     Time  8    Period  Weeks    Status  On-going    Target Date  01/21/18      PT LONG TERM GOAL #2   Title   Patient (< 6 years old) will complete five times sit to stand test in < 10 seconds indicating an increased LE strength and improved balance.    Baseline  27.95 sec 12/01/17    Time  8    Period  Weeks    Status  Partially Met    Target Date  01/21/18      PT LONG TERM GOAL #3   Title  Patient will increase six minute walk test distance to >1000 for progression to community ambulator and improve gait ability     Baseline  450 feet 12/01/17    Time  8    Period  Weeks    Status  New    Target Date  01/21/18      PT LONG TERM GOAL #4   Title  Patient will increase 10 meter walk test to >1.69ms as to improve gait speed for better community ambulation and to reduce fall     Baseline  . 57 m/sec 12/01/17    Time  8    Period  Weeks    Status  New    Target Date  01/21/18            Plan - 12/03/17 1417    Clinical Impression Statement Patient performs dynamic standing balance tasks on flat surfaces and foam without UE support and has fatigue with standing.Patient needs verbal cueing and CGA to maintain center of gravity during all dynamic standing balance activities. Patient required UE support for side stepping. She has difficulty with single leg balance activities and has difficulty taking bigger steps or step ups   and needs the parallel bars for support. She has poor balance during single leg activities and will conitnue to benefit from skilled PT to improve safety and mobility.     Rehab Potential  Good    PT Frequency  2x / week    PT Duration  8 weeks    PT Treatment/Interventions  Balance training;Therapeutic exercise;Therapeutic activities;Functional mobility training;Stair training;Aquatic Therapy;Patient/family education    PT Next Visit Plan  therapeutic exercise and balance training    PT Home Exercise Plan  seated marching and LAQ    Consulted and Agree with Plan of Care  Patient       Patient will benefit from skilled therapeutic intervention in order to improve the  following deficits and impairments:  Abnormal gait, Decreased endurance, Decreased mobility, Difficulty walking, Decreased activity tolerance, Decreased safety awareness, Decreased strength, Pain, Impaired UE functional use, Obesity, Dizziness  Visit Diagnosis: Difficulty in walking, not elsewhere classified     Problem List Patient Active Problem List   Diagnosis Date Noted  . Atrophic vaginitis 07/03/2015  . Urinary frequency 07/03/2015  . Urge incontinence 07/03/2015  . Anxiety and depression 07/08/2014  . BP (high blood pressure) 07/08/2014  . HLD (hyperlipidemia) 07/08/2014  . Idiopathic Parkinson's disease ([place]) 07/08/2014  . Parkinson's disease ([place]) 07/08/2014  . DDD (degenerative disc disease), lumbar 04/04/2014  . Neuritis or radiculitis due to rupture of lumbar intervertebral disc 04/04/2014    Alanson Puls, [place] DPT 12/03/2017, 2:44 PM  [place] MAIN [facility] SERVICES [address] [place], [place], [address] Phone: [phone]   Fax:  [phone]  Name: Hannah Howard MRN: [id number] Date of Birth: 08-07-1941

## 2017-12-09 ENCOUNTER — Encounter: Payer: Self-pay | Admitting: Physical Therapy

## 2017-12-09 ENCOUNTER — Ambulatory Visit: Payer: Medicare Other | Admitting: Physical Therapy

## 2017-12-09 DIAGNOSIS — R262 Difficulty in walking, not elsewhere classified: Secondary | ICD-10-CM | POA: Diagnosis not present

## 2017-12-09 DIAGNOSIS — M6281 Muscle weakness (generalized): Secondary | ICD-10-CM

## 2017-12-09 NOTE — Therapy (Signed)
[place] MAIN [facility] SERVICES [address] [place], [place], [address] Phone: [phone]   Fax:  [phone]  Physical Therapy Treatment  Patient Details  Name: Hannah Howard MRN: [id number] Date of Birth: 1941-10-20 Referring Provider: Fulton Reek D    Encounter Date: 12/09/2017  PT End of Session - 12/09/17 1145    Visit Number  9    Number of Visits  17    Date for PT Re-Evaluation  01/21/18    Authorization Type  8/10 g codes    PT Start Time  1140    PT Stop Time  1220    PT Time Calculation (min)  40 min    Equipment Utilized During Treatment  Gait belt    Activity Tolerance  Patient limited by fatigue;Patient tolerated treatment well    Behavior During Therapy  [facility] for tasks assessed/performed       Past Medical History:  Diagnosis Date  . Anxiety   . Arthritis   . Depression   . GERD (gastroesophageal reflux disease)   . Hyperlipidemia   . Parkinsonism [facility])     Past Surgical History:  Procedure Laterality Date  . BREAST BIOPSY Left 1973   neg  . KNEE ARTHROSCOPY Right 2010  . KNEE ARTHROSCOPY Left 2012    There were no vitals filed for this visit.  Subjective Assessment - 12/09/17 1144    Subjective  [name] is doing fine today with no new concerns. She is not feeling as bad as the other day.     Patient is accompained by:  Family member    Pertinent History  Patient has been having a slow decline in mobility with ambulation and transfers. She is not able to ambulate intermediate and long distances and needs to rest frquently . She has had 2 falls and has a fear of falling.     Limitations  Standing;Walking;House hold activities    Patient Stated Goals  patient wants to walk longer distances and does not want to keep falling    Currently in Pain?  No/denies    Pain Score  0-No pain    Pain Onset  More than a month ago    Multiple Pain Sites  No      Treatment: Octane fitness x 5 mins (warm  up)  NEUROMUSCULAR RE-EDUCATION Airex tapping to 6 inch stool x 20  Each; Airex stepping to 6 inch stool x 20  each; Rocker board tandem and weight shifting fwd/bwd Tandem gait in // bars x 4 laps  Side stepping on blue foam balance beam x 5 lengths of the parallel bars  4 square fwd/bwd, side to side stepping/ diagonal stepping     Therapeutic exercise Quantum double leg press 105# x 10, 120# x 10, 135# x 10; ;                         PT Education - 12/09/17 1145    Education provided  Yes    Education Details  exercise technique    Person(s) Educated  Patient    Methods  Explanation    Comprehension  Verbalized understanding       PT Short Term Goals - 12/01/17 1329      PT SHORT TERM GOAL #1   Title  Patient will increase six minute walk test distance to >500 for progression to community ambulator and improve gait ability     Baseline  450  ft    Time  4    Period  Weeks    Status  New    Target Date  10/29/17      PT SHORT TERM GOAL #2   Title  Patient (< 93 years old) will complete five times sit to stand test in < 10 seconds indicating an increased LE strength and improved balance.    Baseline  27.95 sec    Time  4    Period  Weeks    Status  On-going        PT Long Term Goals - 12/01/17 1313      PT LONG TERM GOAL #1   Title  Patient will be independent in home exercise program to improve strength/mobility for better functional independence with ADLs     Baseline  Patient fell and stopped her HEP     Time  8    Period  Weeks    Status  On-going    Target Date  01/21/18      PT LONG TERM GOAL #2   Title  Patient (< 9 years old) will complete five times sit to stand test in < 10 seconds indicating an increased LE strength and improved balance.    Baseline  27.95 sec 12/01/17    Time  8    Period  Weeks    Status  Partially Met    Target Date  01/21/18      PT LONG TERM GOAL #3   Title  Patient will increase six minute walk test  distance to >1000 for progression to community ambulator and improve gait ability     Baseline  450 feet 12/01/17    Time  8    Period  Weeks    Status  New    Target Date  01/21/18      PT LONG TERM GOAL #4   Title  Patient will increase 10 meter walk test to >1.86ms as to improve gait speed for better community ambulation and to reduce fall     Baseline  . 57 m/sec 12/01/17    Time  8    Period  Weeks    Status  New    Target Date  01/21/18            Plan - 12/09/17 1146    Clinical Impression Statement  Patient required min verbal cueing during foam balance and  stepping, and required CGA during all dynamic standing balance activities. Patient required occasional rest breaks between exercises due to fatigue. Patient tolerated exercise well. Patient will continue to benefit from skilled therapy in order to improve dynamic standing balance activities and increase gait speed to reduce risk for falls    Rehab Potential  Good    PT Frequency  2x / week    PT Duration  8 weeks    PT Treatment/Interventions  Balance training;Therapeutic exercise;Therapeutic activities;Functional mobility training;Stair training;Aquatic Therapy;Patient/family education    PT Next Visit Plan  therapeutic exercise and balance training    PT Home Exercise Plan  seated marching and LAQ    Consulted and Agree with Plan of Care  Patient       Patient will benefit from skilled therapeutic intervention in order to improve the following deficits and impairments:  Abnormal gait, Decreased endurance, Decreased mobility, Difficulty walking, Decreased activity tolerance, Decreased safety awareness, Decreased strength, Pain, Impaired UE functional use, Obesity, Dizziness  Visit Diagnosis: Difficulty in walking, not elsewhere classified  Muscle weakness (  generalized)     Problem List Patient Active Problem List   Diagnosis Date Noted  . Atrophic vaginitis 07/03/2015  . Urinary frequency 07/03/2015  . Urge  incontinence 07/03/2015  . Anxiety and depression 07/08/2014  . BP (high blood pressure) 07/08/2014  . HLD (hyperlipidemia) 07/08/2014  . Idiopathic Parkinson's disease ([place]) 07/08/2014  . Parkinson's disease ([place]) 07/08/2014  . DDD (degenerative disc disease), lumbar 04/04/2014  . Neuritis or radiculitis due to rupture of lumbar intervertebral disc 04/04/2014    Alanson Puls, [place] DPT 12/09/2017, 11:47 AM  [place] MAIN [facility] SERVICES [place], [place], [address] Phone: [phone]   Fax:  [phone]  Name: DAVIONNE MASTRANGELO MRN: [id number] Date of Birth: May 26, 1941

## 2017-12-11 ENCOUNTER — Ambulatory Visit: Payer: Medicare Other

## 2017-12-11 VITALS — BP 136/55 | HR 88

## 2017-12-11 DIAGNOSIS — M6281 Muscle weakness (generalized): Secondary | ICD-10-CM

## 2017-12-11 DIAGNOSIS — R262 Difficulty in walking, not elsewhere classified: Secondary | ICD-10-CM | POA: Diagnosis not present

## 2017-12-11 NOTE — Therapy (Signed)
[place] MAIN [facility] SERVICES [address] [place], [place], [address] Phone: [phone]   Fax:  [phone]  Physical Therapy Treatment  Patient Details  Name: Hannah Howard MRN: [id number] Date of Birth: 22-Nov-1941 Referring Provider: Fulton Reek D    Encounter Date: 12/11/2017  PT End of Session - 12/11/17 1116    Visit Number  10    Number of Visits  17    Date for PT Re-Evaluation  01/21/18    Authorization Type  8/10 g codes    PT Start Time  1110    PT Stop Time  1150    PT Time Calculation (min)  40 min    Equipment Utilized During Treatment  Gait belt    Activity Tolerance  Patient tolerated treatment well;Patient limited by fatigue    Behavior During Therapy  [facility] for tasks assessed/performed       Past Medical History:  Diagnosis Date  . Anxiety   . Arthritis   . Depression   . GERD (gastroesophageal reflux disease)   . Hyperlipidemia   . Parkinsonism [facility])     Past Surgical History:  Procedure Laterality Date  . BREAST BIOPSY Left 1973   neg  . KNEE ARTHROSCOPY Right 2010  . KNEE ARTHROSCOPY Left 2012    Vitals:   12/11/17 1113  BP: (!) 136/55  Pulse: 88  SpO2: 100%    Subjective Assessment - 12/11/17 1112    Subjective  Pt reports that she is having a lot of "achiness" from her arthritis today. She states that she just saw Dr. Doy Hutching and he is going to increase her Celebrex to BID and start Abilify.  She reports that she feels depressed a lot.     Patient is accompained by:  Family member    Pertinent History  Patient has been having a slow decline in mobility with ambulation and transfers. She is not able to ambulate intermediate and long distances and needs to rest frquently . She has had 2 falls and has a fear of falling.     Limitations  Standing;Walking;House hold activities    Patient Stated Goals  patient wants to walk longer distances and does not want to keep falling    Currently in Pain?  Yes     Pain Score  -- Doesn't rate on NPRS    Pain Location  -- "Head to Toe"    Pain Onset  --             TREATMENT  Ther-ex  NuStep L1/2 x 5 minutes for warm-up during history (3 minutes unbilled); Leg press 75# x 15, 90# x 15, 105# x 15 Practiced getting up/down off the floor on the mat table with red mat on top, pt able to get from sitting down onto her back, roll onto her stomach, and then get into quadruped position. However she is unable to get into tall kneeling or half kneeling positions; Split stance lunge x 10 bilateral in // bars;  Neuromuscular Re-education Tandem gait in // bars x 4 laps, no UE support;  Airex tapping to 6 inch stool x 15 each, no UE support; Airex stepping to 6 inch stool x 15  each, no UE support;                  PT Education - 12/11/17 1116    Education provided  Yes    Education Details  Exercise form/technique    Person(s) Educated  Patient    Methods  Explanation    Comprehension  Verbalized understanding       PT Short Term Goals - 12/01/17 1329      PT SHORT TERM GOAL #1   Title  Patient will increase six minute walk test distance to >500 for progression to community ambulator and improve gait ability     Baseline  450 ft    Time  4    Period  Weeks    Status  New    Target Date  10/29/17      PT SHORT TERM GOAL #2   Title  Patient (< 39 years old) will complete five times sit to stand test in < 10 seconds indicating an increased LE strength and improved balance.    Baseline  27.95 sec    Time  4    Period  Weeks    Status  On-going        PT Long Term Goals - 12/01/17 1313      PT LONG TERM GOAL #1   Title  Patient will be independent in home exercise program to improve strength/mobility for better functional independence with ADLs     Baseline  Patient fell and stopped her HEP     Time  8    Period  Weeks    Status  On-going    Target Date  01/21/18      PT LONG TERM GOAL #2   Title  Patient (< 53  years old) will complete five times sit to stand test in < 10 seconds indicating an increased LE strength and improved balance.    Baseline  27.95 sec 12/01/17    Time  8    Period  Weeks    Status  Partially Met    Target Date  01/21/18      PT LONG TERM GOAL #3   Title  Patient will increase six minute walk test distance to >1000 for progression to community ambulator and improve gait ability     Baseline  450 feet 12/01/17    Time  8    Period  Weeks    Status  New    Target Date  01/21/18      PT LONG TERM GOAL #4   Title  Patient will increase 10 meter walk test to >1.5ms as to improve gait speed for better community ambulation and to reduce fall     Baseline  . 57 m/sec 12/01/17    Time  8    Period  Weeks    Status  New    Target Date  01/21/18            Plan - 12/11/17 1116    Clinical Impression Statement  Patient is very encouraged by strength training during session today. She should be able to increase her resistance with leg press in follow-up sessions. Attempted to practice getting up from floor but pt does not possess adequate strength to perform at this time. Pt encouraged to continue her current HEP and follow-up as scheduled.     Clinical Presentation  Evolving    Rehab Potential  Good    PT Frequency  2x / week    PT Duration  8 weeks    PT Treatment/Interventions  Balance training;Therapeutic exercise;Therapeutic activities;Functional mobility training;Stair training;Aquatic Therapy;Patient/family education    PT Next Visit Plan  therapeutic exercise and balance training    PT Home Exercise Plan  seated marching and LAQ  Consulted and Agree with Plan of Care  Patient       Patient will benefit from skilled therapeutic intervention in order to improve the following deficits and impairments:  Abnormal gait, Decreased endurance, Decreased mobility, Difficulty walking, Decreased activity tolerance, Decreased safety awareness, Decreased strength, Pain, Impaired  UE functional use, Obesity, Dizziness  Visit Diagnosis: Difficulty in walking, not elsewhere classified  Muscle weakness (generalized)     Problem List Patient Active Problem List   Diagnosis Date Noted  . Atrophic vaginitis 07/03/2015  . Urinary frequency 07/03/2015  . Urge incontinence 07/03/2015  . Anxiety and depression 07/08/2014  . BP (high blood pressure) 07/08/2014  . HLD (hyperlipidemia) 07/08/2014  . Idiopathic Parkinson's disease ([place]) 07/08/2014  . Parkinson's disease ([facility]) 07/08/2014  . DDD (degenerative disc disease), lumbar 04/04/2014  . Neuritis or radiculitis due to rupture of lumbar intervertebral disc 04/04/2014   Phillips Grout PT, DPT   Huprich,Jason 12/11/2017, 2:52 PM  [place] MAIN [facility] SERVICES [address] [place], [place], [address] Phone: [phone]   Fax:  [phone]  Name: Hannah Howard MRN: [id number] Date of Birth: 17-Sep-1941

## 2017-12-16 ENCOUNTER — Ambulatory Visit: Payer: Medicare Other | Admitting: Physical Therapy

## 2017-12-18 ENCOUNTER — Ambulatory Visit: Payer: Medicare Other | Admitting: Physical Therapy

## 2017-12-23 ENCOUNTER — Ambulatory Visit: Payer: Medicare Other | Admitting: Physical Therapy

## 2017-12-25 ENCOUNTER — Ambulatory Visit: Payer: Medicare Other | Admitting: Physical Therapy

## 2018-01-02 ENCOUNTER — Other Ambulatory Visit: Payer: Self-pay | Admitting: Internal Medicine

## 2018-01-02 DIAGNOSIS — Z1231 Encounter for screening mammogram for malignant neoplasm of breast: Secondary | ICD-10-CM

## 2018-02-05 DIAGNOSIS — G8929 Other chronic pain: Secondary | ICD-10-CM | POA: Insufficient documentation

## 2018-02-05 DIAGNOSIS — R52 Pain, unspecified: Secondary | ICD-10-CM | POA: Insufficient documentation

## 2018-02-25 ENCOUNTER — Ambulatory Visit
Admission: RE | Admit: 2018-02-25 | Discharge: 2018-02-25 | Disposition: A | Discharge status: Home / Self Care | Payer: Medicare Other | Source: Ambulatory Visit | Attending: Internal Medicine | Admitting: Internal Medicine

## 2018-02-25 DIAGNOSIS — Z1231 Encounter for screening mammogram for malignant neoplasm of breast: Secondary | ICD-10-CM | POA: Diagnosis not present

## 2018-04-01 ENCOUNTER — Ambulatory Visit
Admission: RE | Admit: 2018-04-01 | Discharge: 2018-04-01 | Disposition: A | Discharge status: Home / Self Care | Payer: Medicare Other | Source: Ambulatory Visit | Attending: Unknown Physician Specialty | Admitting: Unknown Physician Specialty

## 2018-04-01 DIAGNOSIS — E041 Nontoxic single thyroid nodule: Secondary | ICD-10-CM

## 2018-04-01 DIAGNOSIS — E042 Nontoxic multinodular goiter: Secondary | ICD-10-CM | POA: Insufficient documentation

## 2018-04-02 ENCOUNTER — Other Ambulatory Visit: Payer: Self-pay | Admitting: Unknown Physician Specialty

## 2018-04-02 DIAGNOSIS — E041 Nontoxic single thyroid nodule: Secondary | ICD-10-CM

## 2018-04-17 ENCOUNTER — Other Ambulatory Visit: Payer: Self-pay | Admitting: Internal Medicine

## 2018-04-17 DIAGNOSIS — R270 Ataxia, unspecified: Secondary | ICD-10-CM

## 2018-05-12 ENCOUNTER — Ambulatory Visit
Admission: RE | Admit: 2018-05-12 | Discharge: 2018-05-12 | Disposition: A | Discharge status: Home / Self Care | Payer: Medicare Other | Source: Ambulatory Visit | Attending: Internal Medicine | Admitting: Internal Medicine

## 2018-05-12 DIAGNOSIS — G319 Degenerative disease of nervous system, unspecified: Secondary | ICD-10-CM | POA: Insufficient documentation

## 2018-05-12 DIAGNOSIS — R270 Ataxia, unspecified: Secondary | ICD-10-CM | POA: Insufficient documentation

## 2018-05-12 DIAGNOSIS — I10 Essential (primary) hypertension: Secondary | ICD-10-CM | POA: Diagnosis not present

## 2018-07-05 NOTE — Progress Notes (Signed)
76/73/[id number] 7:90 PM   Hannah Howard [birth year] [id number]  Referring provider: Idelle Crouch, MD [facility] [place], [place] [id number]  Chief Complaint  Patient presents with  . Urinary Frequency    HPI: Patient is a 77 -year-old Caucasian female with Parkinson who is referred to [place] by Dr. Doy Hutching for urinary incontinence with her life long friend,  Dianah Field  Patient states that she has had urinary incontinence for several years.    Patient has incontinence with urge incontinence.   She is experiencing 2 to 3 incontinent episodes during the day.  She is experiencing 3 to 4 incontinent episodes during the night.  Her incontinence volume is large soaking through clothes.  She is wearing pull ups pads/depends daily.    She is having associated urinary frequency x 8-10, strong urgency, nocturia x q 1 hour, intermittency, hesitancy, straining to urinate and weak urinary stream.   She denies/endorses dysuria, gross hematuria, suprapubic pain, back pain, abdominal pain or flank pain.  She has been on Detrol XL 4 mg daily x 2 years.    She does not have a history of nephrolithiasis, GU surgery or GU trauma.   She is post menopausal.   She admits to constipation.    She is drinking 2 of the hospital jugs of water daily.   She drinks two cups of coffee in the morning.   She is drinking one sweet tea daily.  Occasional soda.  She does not drink alcohol.      She eats a bowel of ice cream at lunch time.  She has been stopping liquids at 5 pm.    PMH: Past Medical History:  Diagnosis Date  . Anxiety   . Arthritis   . Depression   . GERD (gastroesophageal reflux disease)   . Hyperlipidemia   . Parkinsonism [facility])     Surgical History: Past Surgical History:  Procedure Laterality Date  . BREAST BIOPSY Left 1973   neg  . KNEE ARTHROSCOPY Right 2010  . KNEE ARTHROSCOPY Left 2012    Home Medications:  Allergies as of 07/06/2018   No  Known Allergies     Medication List        Accurate as of 07/06/18  4:12 PM. Always use your most recent med list.          aspirin EC 81 MG tablet Take by mouth.   carbidopa-levodopa 25-100 MG tablet Commonly known as:  SINEMET IR   celecoxib 200 MG capsule Commonly known as:  CELEBREX TAKE 1 CAPSULE EVERY DAY   clotrimazole-betamethasone cream Commonly known as:  LOTRISONE APPLY TOPICALLY TWO TIMES DAILY   conjugated estrogens vaginal cream Commonly known as:  PREMARIN Apply 0.5mg  (pea-sized amount)  just inside the vaginal introitus with a finger-tip on  Monday, Wednesday and Friday nights.   estradiol 0.1 MG/GM vaginal cream Commonly known as:  ESTRACE Apply 0.5mg  (pea-sized amount)  just inside the vaginal introitus with a finger-tip on Monday, Wednesday and Friday nights.   hydrOXYzine 50 MG tablet Commonly known as:  ATARAX/VISTARIL TAKE ONE TABLET 3 TIMES DAILY AS NEEDED FOR ITCHING   lansoprazole 30 MG capsule Commonly known as:  PREVACID   lovastatin 40 MG tablet Commonly known as:  MEVACOR   mirabegron ER 50 MG Tb24 tablet Commonly known as:  MYRBETRIQ Take 1 tablet (50 mg total) by mouth daily.   mirabegron ER 50 MG Tb24 tablet Commonly known as:  MYRBETRIQ Take  1 tablet (50 mg total) by mouth daily.   multivitamin capsule Take by mouth.   sertraline 100 MG tablet Commonly known as:  ZOLOFT   STOOL SOFTENER 100 MG capsule Generic drug:  docusate sodium Take by mouth.   tolterodine 4 MG 24 hr capsule Commonly known as:  DETROL LA Take 1 capsule (4 mg total) by mouth daily.   traZODone 150 MG tablet Commonly known as:  DESYREL   triamcinolone cream 0.1 % Commonly known as:  KENALOG       Allergies: No Known Allergies  Family History: Family History  Problem Relation Age of Onset  . Hematuria Unknown   . Kidney failure Unknown   . Prostate cancer Unknown   . Breast cancer Neg Hx     Social History:  reports that she quit  smoking about 18 years ago. Her smoking use included cigarettes. She has never used smokeless tobacco. She reports that she does not drink alcohol or use drugs.  ROS: UROLOGY Frequent Urination?: Yes Hard to postpone urination?: Yes Burning/pain with urination?: No Get up at night to urinate?: Yes Leakage of urine?: Yes Urine stream starts and stops?: Yes Trouble starting stream?: Yes Do you have to strain to urinate?: Yes Blood in urine?: No Urinary tract infection?: No Sexually transmitted disease?: No Injury to kidneys or bladder?: No Painful intercourse?: No Weak stream?: Yes Currently pregnant?: No Vaginal bleeding?: No  Gastrointestinal Nausea?: No Vomiting?: No Indigestion/heartburn?: Yes Diarrhea?: No Constipation?: Yes  Constitutional Fever: No Night sweats?: No Weight loss?: No Fatigue?: Yes  Skin Skin rash/lesions?: No Itching?: Yes  Eyes Blurred vision?: Yes Double vision?: No  Ears/Nose/Throat Sore throat?: No Sinus problems?: Yes  Hematologic/Lymphatic Swollen glands?: No Easy bruising?: Yes  Cardiovascular Leg swelling?: Yes Chest pain?: No  Respiratory Cough?: No Shortness of breath?: Yes  Endocrine Excessive thirst?: No  Musculoskeletal Back pain?: Yes Joint pain?: Yes  Neurological Headaches?: Yes Dizziness?: Yes  Psychologic Depression?: Yes Anxiety?: Yes  Physical Exam: BP 110/72   Pulse 62   Ht 5\' 6"  (1.676 m)   Wt 225 lb (102.1 kg)   BMI 36.32 kg/m   Constitutional: Well nourished. Alert and oriented, No acute distress. HEENT: [place] AT, moist mucus membranes. Trachea midline, no masses. Cardiovascular: No clubbing, cyanosis, or edema. Respiratory: Normal respiratory effort, no increased work of breathing. GI: Abdomen is soft, non tender, non distended, no abdominal masses. Liver and spleen not palpable.  No hernias appreciated.  Stool sample for occult testing is not indicated.   GU: No CVA tenderness.  No  bladder fullness or masses.  Atrophic external genitalia, normal pubic hair distribution, no lesions.  Normal urethral meatus, no lesions, no prolapse, no discharge.   No urethral masses, tenderness and/or tenderness. No bladder fullness, tenderness or masses.  Pale vagina mucosa, poor estrogen effect, no discharge, no lesions, poor pelvic support, grade I cystocele, no rectocele noted.  No cervical motion tenderness.  Uterus is freely mobile and non-fixed.  No adnexal/parametria masses or tenderness noted.  Anus and perineum are without rashes or lesions.    Skin: No rashes, bruises or suspicious lesions. Lymph: No cervical or inguinal adenopathy. Neurologic: Grossly intact, no focal deficits, moving all 4 extremities. Psychiatric: Normal mood and affect.  Laboratory Data: Lab Results  Component Value Date   WBC 7.8 01/03/2012   HGB 13.1 01/03/2012   HCT 39.2 01/03/2012   MCV 94 01/03/2012   PLT 343 01/03/2012    Lab Results  Component Value  Date   CREATININE 1.03 01/03/2012    No results found for: PSA  No results found for: TESTOSTERONE  No results found for: HGBA1C  No results found for: TSH  No results found for: CHOL, HDL, CHOLHDL, VLDL, LDLCALC  No results found for: AST No results found for: ALT No components found for: ALKALINEPHOPHATASE No components found for: BILIRUBINTOTAL  No results found for: ESTRADIOL  Urinalysis    Component Value Date/Time   APPEARANCEUR Clear 12/22/2015 1455   GLUCOSEU Negative 12/22/2015 1455   BILIRUBINUR Negative 12/22/2015 1455   PROTEINUR Negative 12/22/2015 1455   NITRITE Negative 12/22/2015 1455   LEUKOCYTESUR 1+ (A) 12/22/2015 1455    I have reviewed the labs.   Pertinent Imaging: Results for SALVADOR, BIGBEE (MRN [id number]) as of 07/06/2018 16:19  Ref. Range 07/06/2018 15:30  Scan Result Unknown 53     Assessment & Plan:   1. Urge Incontinence Discussed behavioral therapies, bladder training and bladder control  strategies Pelvic floor muscle training - offered referral to PT - patient declines at this time as she is having PT for her Parkinson's Discuss fluid management  Offered medical therapy with the beta-3 adrenergic receptor agonist (Myrbetriq).  Given Myrbetriq 25 mg samples, #28.  I have reviewed with the patient of the side effects of Myrbetriq, such as: elevation in BP, urinary retention and/or HA.   She will return in one month for PVR and symptom recheck.    2. Vaginal atrophy I explained to the patient that when women go through menopause and her estrogen levels are severely diminished, the normal vaginal flora will change.  This is due to an increase of the vaginal canal's pH. Because of this, the vaginal canal may be colonized by bacteria from the rectum instead of the protective lactobacillus.  This, accompanied by the loss of the mucus barrier with vaginal atrophy, is a cause of recurrent urinary tract infections. In some studies, the use of vaginal estrogen cream has been demonstrated to reduce  recurrent urinary tract infections to one a year.  Patient was given a sample of vaginal estrogen cream (Premarin vaginal cream) and instructed to apply 0.5mg  (pea-sized amount)  just inside the vaginal introitus with a finger-tip on Monday, Wednesday and Friday nights.  I explained to the patient that vaginally administered estrogen, which causes only a slight increase in the blood estrogen levels, have fewer contraindications and adverse systemic effects that oral HT. I have also given prescriptions for the Estrace cream and Premarin cream, so that the patient may carry them to the pharmacy to see which one of the branded creams would be most economical for her.  If she finds both medications cost prohibitive, she is instructed to call the office.  We can then call in a compounded vaginal estrogen cream for the patient that may be more affordable.   She will follow up in three months for an exam.      Return in about 1 month (around 08/06/2018) for PVR and OAB questionnaire.  These notes generated with voice recognition software. I apologize for typographical errors.  Zara Council, [facility] [address] [place], [place] [address] [phone]

## 2018-07-06 ENCOUNTER — Encounter: Payer: Self-pay | Admitting: Urology

## 2018-07-06 ENCOUNTER — Ambulatory Visit (INDEPENDENT_AMBULATORY_CARE_PROVIDER_SITE_OTHER): Payer: Medicare Other | Admitting: Urology

## 2018-07-06 ENCOUNTER — Other Ambulatory Visit: Payer: Self-pay

## 2018-07-06 VITALS — BP 110/72 | HR 62 | Ht 66.0 in | Wt 225.0 lb

## 2018-07-06 DIAGNOSIS — N3946 Mixed incontinence: Secondary | ICD-10-CM

## 2018-07-06 DIAGNOSIS — N952 Postmenopausal atrophic vaginitis: Secondary | ICD-10-CM | POA: Diagnosis not present

## 2018-07-06 LAB — BLADDER SCAN AMB NON-IMAGING: SCAN RESULT: 53

## 2018-07-06 MED ORDER — MIRABEGRON ER 50 MG PO TB24: 50.0000 mg | ORAL_TABLET | Freq: Every day | ORAL | 0 refills | Status: DC

## 2018-07-06 MED ORDER — ESTRADIOL 0.1 MG/GM VA CREA: TOPICAL_CREAM | VAGINAL | 12 refills | Status: DC

## 2018-07-06 MED ORDER — ESTROGENS, CONJUGATED 0.625 MG/GM VA CREA: TOPICAL_CREAM | VAGINAL | 12 refills | Status: DC

## 2018-07-06 NOTE — Patient Instructions (Signed)
I have given you two prescriptions for a vaginal estrogen cream.  Estrace and Premarin.  Please take these to your pharmacy and see which one your insurance covers.  If both are too expensive, please call the office at [phone] for an alternative.  You are given a sample of vaginal estrogen cream Premarin and instructed to apply 0.5mg (pea-sized amount)  just inside the vaginal introitus with a finger-tip on Monday, Wednesday and Friday nights,     

## 2018-08-06 ENCOUNTER — Ambulatory Visit: Payer: Medicare Other | Admitting: Urology

## 2018-12-18 ENCOUNTER — Other Ambulatory Visit: Payer: Self-pay | Admitting: Internal Medicine

## 2018-12-18 DIAGNOSIS — Z1231 Encounter for screening mammogram for malignant neoplasm of breast: Secondary | ICD-10-CM

## 2019-04-02 ENCOUNTER — Ambulatory Visit: Admission: RE | Admit: 2019-04-02 | Payer: Medicare Other | Source: Ambulatory Visit

## 2019-05-17 ENCOUNTER — Other Ambulatory Visit: Payer: Self-pay

## 2019-05-17 ENCOUNTER — Ambulatory Visit
Admission: RE | Admit: 2019-05-17 | Discharge: 2019-05-17 | Disposition: A | Discharge status: Home / Self Care | Payer: Medicare Other | Source: Ambulatory Visit | Attending: Internal Medicine | Admitting: Internal Medicine

## 2019-05-17 DIAGNOSIS — Z1231 Encounter for screening mammogram for malignant neoplasm of breast: Secondary | ICD-10-CM | POA: Diagnosis not present

## 2019-08-24 ENCOUNTER — Ambulatory Visit: Payer: Medicare Other | Admitting: Urology

## 2019-08-26 ENCOUNTER — Ambulatory Visit: Payer: Medicare Other | Admitting: Urology

## 2019-08-26 ENCOUNTER — Encounter: Payer: Self-pay | Admitting: Urology

## 2019-12-09 ENCOUNTER — Other Ambulatory Visit: Payer: Self-pay

## 2019-12-09 ENCOUNTER — Encounter: Payer: Self-pay | Admitting: Podiatry

## 2019-12-09 ENCOUNTER — Ambulatory Visit (INDEPENDENT_AMBULATORY_CARE_PROVIDER_SITE_OTHER): Payer: Medicare Other | Admitting: Podiatry

## 2019-12-09 DIAGNOSIS — M79675 Pain in left toe(s): Secondary | ICD-10-CM

## 2019-12-09 DIAGNOSIS — B351 Tinea unguium: Secondary | ICD-10-CM | POA: Diagnosis not present

## 2019-12-09 DIAGNOSIS — M79674 Pain in right toe(s): Secondary | ICD-10-CM | POA: Diagnosis not present

## 2019-12-09 NOTE — Progress Notes (Signed)
Complaint:  Visit Type: Patient presents  to my office for  preventative foot care services. Complaint: Patient states" my nails have grown long and thick and become painful to walk and wear shoes" Patient has been diagnosed with DM with Parkinsons.  Patient presents to the office with her daughter.  The patient presents for preventative foot care services.  Podiatric Exam: Vascular: dorsalis pedis and posterior tibial pulses are palpable bilateral. Capillary return is immediate. Temperature gradient is WNL. Skin turgor WNL  Sensorium: Normal Semmes Weinstein monofilament test. Normal tactile sensation bilaterally. Nail Exam: Pt has thick disfigured discolored nails with subungual debris noted bilateral entire nail hallux through fifth toenails.  Her right hallux nail plate has lifted from under the proximal nail fold.  Asymptomatic. Ulcer Exam: There is no evidence of ulcer or pre-ulcerative changes or infection. Orthopedic Exam: Muscle tone and strength are WNL. No limitations in general ROM. No crepitus or effusions noted. Foot type and digits show no abnormalities. Bony prominences are unremarkable. Skin: No Porokeratosis. No infection or ulcers  Diagnosis:  Onychomycosis, , Pain in right toe, pain in left toes  Treatment & Plan Procedures and Treatment: Consent by patient was obtained for treatment procedures.   Debridement of mycotic and hypertrophic toenails, 1 through 5 bilateral and clearing of subungual debris. No ulceration, no infection noted.  Return Visit-Office Procedure: Patient instructed to return to the office for a follow up visit 3 months for continued evaluation and treatment.    Gardiner Barefoot DPM

## 2020-02-15 ENCOUNTER — Ambulatory Visit (INDEPENDENT_AMBULATORY_CARE_PROVIDER_SITE_OTHER): Payer: Medicare Other | Admitting: Dermatology

## 2020-02-15 ENCOUNTER — Other Ambulatory Visit: Payer: Self-pay

## 2020-02-15 DIAGNOSIS — L409 Psoriasis, unspecified: Secondary | ICD-10-CM

## 2020-02-15 DIAGNOSIS — L2089 Other atopic dermatitis: Secondary | ICD-10-CM

## 2020-02-15 DIAGNOSIS — L239 Allergic contact dermatitis, unspecified cause: Secondary | ICD-10-CM

## 2020-02-15 MED ORDER — DUPILUMAB 300 MG/2ML ~~LOC~~ SOAJ
300.0000 mg | SUBCUTANEOUS | Status: DC
  Administered 2020-02-15: 300 mg via SUBCUTANEOUS

## 2020-02-15 MED ORDER — TRIAMCINOLONE ACETONIDE 0.1 % EX CREA: TOPICAL_CREAM | Freq: Two times a day (BID) | CUTANEOUS | 1 refills | Status: DC

## 2020-02-15 MED ORDER — OTEZLA 10 & 20 & 30 MG PO TBPK: ORAL_TABLET | ORAL | 0 refills | Status: DC

## 2020-02-15 NOTE — Progress Notes (Signed)
   Follow-Up Visit   Subjective  Hannah Howard is a 79 y.o. female  Very complicated patient with multiple dermatology diagnoses who presents for the following: Follow-up (Severe atopic derm and possible psoriasis - Dupixent qowk, TMC 0.1% cream, Otezla 30 mg bid. Patient says she can not tell any difference with the Dupixent and the [place]. She is still itching and now the breakouts are starting on her legs.). 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 4- Pruritus    The following portions of the chart were reviewed this encounter and updated as appropriate:     Review of Systems: No other skin or systemic complaints.  Objective  Well appearing patient in no apparent distress; mood and affect are within normal limits.  A focused examination was performed including hands, legs, feet, back, face, scalp. Relevant physical exam findings are noted in the Assessment and Plan.  Objective  generalized: Plaques on palms. Pink plaque proximal palms. Hyperkeratosis of feet. More significant eruption of back and buttock.  History of positive patch test to #36 2-bromo-2-Nitropropane-1,3-Diol (Bronopol)  Objective  generalized: Plaques on palms. Pink plaque proximal palms. Hyperkeratosis of feet. More significant eruption of back and buttock.  Biopsy proven Severe atopic dermatitis with severe pruritis<rash improving, itch persistent.  Objective  generalized: Guttate patches of buttocks, back and sacral area.  Assessment & Plan  Allergic dermatitis generalized  Other Related Procedures Ambulatory referral to Dermatology  Other atopic dermatitis generalized  Biopsy proven Severe Atopic Dermatitis with severe pruritus. Rash and itch is much improved on Dupixent, topical Triamcinolone for skin of body; Mometasone solution to scalp and  Hydroxyzine; Allegra; Singulair; CeraVe moisturizer - but some persistence.   Dupixent injection today to left abdomen. Continue Dupixent injections q2weeks. Continue Triamcinolone 0.1% cream qd-bid up to 5 days per week - avoid face, groin, underarms. Continue Mometasone solution qd up to 5 days per week to scalp. Continue Allegra 180mg  1 po qam, Singulair 10mg  1 po qhs, Hydroxyzine 50mg  1 po qhs prn. Continue moisturizer daily.  Dupilumab SOPN 300 mg - generalized  Other Related Procedures Ambulatory referral to Dermatology  Psoriasis generalized  With typical psoriatic plaques of palms and soles, and improved on treatment, but no significant further improvement on Otezla for the past 4 weeks.  Will continue current topical steroids (Triamcinolone and Mometasone) and oral Otezla for now.  May consider systemic biologic injection in the future such as Humira  Discussed that Dr Laurence Ferrari and Dr Nicole Kindred have both evaluated her here at [facility], and other than adding or switching to a systemic injectable Biologic agent for Psoriasis, there is not much more to consider.  Will refer to [facility]ology in [place] for their opinion on pt.  Apremilast (OTEZLA) 10 & 20 & 30 MG TBPK - generalized  Other Related Procedures Ambulatory referral to Dermatology  Return in about 2 weeks (around 02/29/2020).   I, Ashok Cordia, CMA, am acting as scribe for Sarina Ser, MD .

## 2020-02-29 ENCOUNTER — Other Ambulatory Visit: Payer: Self-pay | Admitting: Dermatology

## 2020-02-29 ENCOUNTER — Ambulatory Visit: Payer: Medicare Other | Admitting: Dermatology

## 2020-02-29 DIAGNOSIS — L209 Atopic dermatitis, unspecified: Secondary | ICD-10-CM

## 2020-03-01 ENCOUNTER — Other Ambulatory Visit: Payer: Self-pay

## 2020-03-01 ENCOUNTER — Ambulatory Visit (INDEPENDENT_AMBULATORY_CARE_PROVIDER_SITE_OTHER): Payer: Medicare Other | Admitting: Dermatology

## 2020-03-01 DIAGNOSIS — L409 Psoriasis, unspecified: Secondary | ICD-10-CM | POA: Diagnosis not present

## 2020-03-01 DIAGNOSIS — L209 Atopic dermatitis, unspecified: Secondary | ICD-10-CM

## 2020-03-01 DIAGNOSIS — L2389 Allergic contact dermatitis due to other agents: Secondary | ICD-10-CM | POA: Diagnosis not present

## 2020-03-01 MED ORDER — DUPILUMAB 300 MG/2ML ~~LOC~~ SOAJ
300.0000 mg | Freq: Once | SUBCUTANEOUS | Status: DC
  Administered 2020-03-01: 300 mg via SUBCUTANEOUS

## 2020-03-01 NOTE — Progress Notes (Signed)
Follow-Up Visit   Subjective  Hannah Howard is a 79 y.o. female  Very complicated patient with multiple dermatology diagnoses who presents for the following: Atopic dermatitis (AD body "no improvement" on Dupixent q2wk, TMC 0.1% cr bid, Mometasone sol qd scalp, Allegra qam, Singulair qd, Hydroxyzine 50mg  qpm), Psoriasis (body "no improvement", Otezla 30mg  1 po qd, no s/e form Otezla, pt has referral to [facility] derm 03/23/20), and Hx of allergic contact dermatitis /ACD (Hx postive rxn in True Patch Test 36 to 2-Bromo-2-Nitropropane-1,3-Diol (Bronopol)). 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 5-past history of tinea pedis treated with oral antifungals and improved Although the patient has been seen for several months and has been evaluated by Dr. Matilde Haymaker, Dr. Nicole Kindred, and I with all their opinions and the above diagnoses and has IMPROVED significantly and drastically clinically, the patient continues to state she is "no better".  We have showed her photos of her previous state which shows obvious improvement.  Nevertheless she still complains of severe itching although she does state that her itching is much improved but it still keeps her aggravated and makes it difficult for her to sleep.    The following portions of the chart were reviewed this encounter and updated as appropriate: Tobacco  Allergies  Meds  Problems  Med Hx  Surg Hx  Fam Hx      Review of Systems: No other skin or systemic complaints.  Objective  Well appearing patient in no apparent distress; mood and affect are within normal limits.  A focused examination was performed including hands, legs, feet, back, face, scalp. Relevant physical exam findings are noted in the Assessment and Plan.  Objective  trunk,  extremities: Pink patches pink scaliness of abdomen, low back.  Upper back some pinkness but mostly clear.    Images          Objective  palms and soles: Peeling and pinkness soles and hypothenar eminence.  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  Consider Gabapentin but concerns with interaction with other psychotropic medications.  Cont Dupixent 300mg /34ml sq injections q 2wks, Dupixent 300mg /30ml sq injection today to R abdomen.  Lot [id number] exp 09/24/2021 Cont TMC 0.1% cr bid up to 5d/wk, avoid face, groin, axilla Cont Mometasone sol qd aa scalp prn flares Cont Allegra 180mg  1 po qam Cont Singulair 10mg  1 po qd Cont Hydroxyzine 50mg  1 po hs prn flares, may make drowsey  Keep referral appointment with [facility] dermatology 03/23/20  Other Related Medications montelukast (SINGULAIR) 10 MG tablet  Psoriasis palms and soles  With typical psoriatic plaques of palms and soles, and improved on treatment, but no significant further improvement on Otezla   Cont TMC 0.1% cr bid up to 5d/wk Cont Otezla 30mg  1 po qd   May consider systemic biologic injection in the future such as Humira  Discussed that Dr Laurence Ferrari and Dr  Nicole Kindred have both evaluated her here at [facility], and other than adding or switching to a systemic injectable Biologic agent for Psoriasis, there is not much more to consider.  Keep referral appointment with [facility] Dermatology 03/23/20  Other Related Medications Apremilast (OTEZLA) 10 & 20 & 30 MG  TBPK  Return in about 2 weeks (around 03/15/2020) for Dupixent injection.   I, Othelia Pulling, RMA, am acting as scribe for Sarina Ser, MD .

## 2020-03-02 ENCOUNTER — Encounter: Payer: Self-pay | Admitting: Dermatology

## 2020-03-09 ENCOUNTER — Ambulatory Visit: Payer: Medicare Other | Admitting: Podiatry

## 2020-03-15 ENCOUNTER — Ambulatory Visit: Payer: Medicare Other | Admitting: Dermatology

## 2020-03-27 ENCOUNTER — Telehealth: Payer: Self-pay

## 2020-03-27 NOTE — Telephone Encounter (Signed)
Reviewed [facility] note from 03/23/20. A punch biopsy was performed.  Lab ordered for Chemistries/Hepatic/Quantiferon Gold. Dupixent and TMC D/Ced. Methotrexate; Folic acid and Clobetasol started.

## 2020-03-27 NOTE — Telephone Encounter (Signed)
Patient was seen at [facility] D[facility]at[facility]. If you would like to review the notes you can find her encounter notes in chart review.

## 2020-03-29 ENCOUNTER — Other Ambulatory Visit: Payer: Self-pay | Admitting: Internal Medicine

## 2020-03-29 DIAGNOSIS — Z1231 Encounter for screening mammogram for malignant neoplasm of breast: Secondary | ICD-10-CM

## 2020-03-30 ENCOUNTER — Other Ambulatory Visit: Payer: Self-pay

## 2020-03-30 ENCOUNTER — Other Ambulatory Visit: Payer: Self-pay | Admitting: Orthopedic Surgery

## 2020-03-30 ENCOUNTER — Ambulatory Visit
Admission: RE | Admit: 2020-03-30 | Discharge: 2020-03-30 | Disposition: A | Discharge status: Home / Self Care | Payer: Medicare Other | Source: Ambulatory Visit | Attending: Orthopedic Surgery | Admitting: Orthopedic Surgery

## 2020-03-30 DIAGNOSIS — S72001A Fracture of unspecified part of neck of right femur, initial encounter for closed fracture: Secondary | ICD-10-CM

## 2020-04-02 DIAGNOSIS — M1611 Unilateral primary osteoarthritis, right hip: Secondary | ICD-10-CM | POA: Insufficient documentation

## 2020-04-05 ENCOUNTER — Other Ambulatory Visit
Admission: RE | Admit: 2020-04-05 | Discharge: 2020-04-05 | Disposition: A | Discharge status: Home / Self Care | Payer: Medicare Other | Source: Ambulatory Visit | Attending: General Surgery | Admitting: General Surgery

## 2020-04-05 DIAGNOSIS — Z20822 Contact with and (suspected) exposure to covid-19: Secondary | ICD-10-CM | POA: Diagnosis not present

## 2020-04-05 DIAGNOSIS — Z01812 Encounter for preprocedural laboratory examination: Secondary | ICD-10-CM | POA: Diagnosis present

## 2020-04-05 LAB — SARS CORONAVIRUS 2 (TAT 6-24 HRS): SARS Coronavirus 2: NEGATIVE

## 2020-04-06 ENCOUNTER — Encounter: Payer: Self-pay | Admitting: General Surgery

## 2020-04-07 ENCOUNTER — Ambulatory Visit
Admission: RE | Admit: 2020-04-07 | Discharge: 2020-04-07 | Disposition: A | Discharge status: Home / Self Care | Payer: Medicare Other | Attending: General Surgery | Admitting: General Surgery

## 2020-04-07 ENCOUNTER — Encounter: Payer: Self-pay | Admitting: *Deleted

## 2020-04-07 ENCOUNTER — Encounter
Admission: RE | Disposition: A | Discharge status: Home / Self Care | Payer: Self-pay | Source: Home / Self Care | Attending: General Surgery

## 2020-04-07 ENCOUNTER — Ambulatory Visit: Payer: Medicare Other | Admitting: Anesthesiology

## 2020-04-07 ENCOUNTER — Other Ambulatory Visit: Payer: Self-pay

## 2020-04-07 DIAGNOSIS — F419 Anxiety disorder, unspecified: Secondary | ICD-10-CM | POA: Insufficient documentation

## 2020-04-07 DIAGNOSIS — M1990 Unspecified osteoarthritis, unspecified site: Secondary | ICD-10-CM | POA: Insufficient documentation

## 2020-04-07 DIAGNOSIS — F329 Major depressive disorder, single episode, unspecified: Secondary | ICD-10-CM | POA: Insufficient documentation

## 2020-04-07 DIAGNOSIS — Z79890 Hormone replacement therapy (postmenopausal): Secondary | ICD-10-CM | POA: Insufficient documentation

## 2020-04-07 DIAGNOSIS — Z7982 Long term (current) use of aspirin: Secondary | ICD-10-CM | POA: Insufficient documentation

## 2020-04-07 DIAGNOSIS — E785 Hyperlipidemia, unspecified: Secondary | ICD-10-CM | POA: Diagnosis not present

## 2020-04-07 DIAGNOSIS — Z79899 Other long term (current) drug therapy: Secondary | ICD-10-CM | POA: Diagnosis not present

## 2020-04-07 DIAGNOSIS — Z1211 Encounter for screening for malignant neoplasm of colon: Secondary | ICD-10-CM | POA: Diagnosis present

## 2020-04-07 DIAGNOSIS — G20 Parkinson's disease: Secondary | ICD-10-CM | POA: Insufficient documentation

## 2020-04-07 DIAGNOSIS — K5730 Diverticulosis of large intestine without perforation or abscess without bleeding: Secondary | ICD-10-CM | POA: Insufficient documentation

## 2020-04-07 DIAGNOSIS — Z87891 Personal history of nicotine dependence: Secondary | ICD-10-CM | POA: Insufficient documentation

## 2020-04-07 DIAGNOSIS — Z86010 Personal history of colonic polyps: Secondary | ICD-10-CM | POA: Diagnosis not present

## 2020-04-07 DIAGNOSIS — K219 Gastro-esophageal reflux disease without esophagitis: Secondary | ICD-10-CM | POA: Diagnosis not present

## 2020-04-07 HISTORY — PX: COLONOSCOPY WITH PROPOFOL: SHX5780

## 2020-04-07 SURGERY — COLONOSCOPY WITH PROPOFOL
Anesthesia: General

## 2020-04-07 MED ORDER — PROPOFOL 500 MG/50ML IV EMUL
INTRAVENOUS | Status: DC | PRN
  Administered 2020-04-07: 100 ug/kg/min via INTRAVENOUS

## 2020-04-07 MED ORDER — PROPOFOL 10 MG/ML IV BOLUS
INTRAVENOUS | Status: AC
  Filled 2020-04-07: qty 20

## 2020-04-07 MED ORDER — SODIUM CHLORIDE 0.9 % IV SOLN
INTRAVENOUS | Status: DC
  Administered 2020-04-07: 1000 mL via INTRAVENOUS

## 2020-04-07 MED ORDER — LIDOCAINE HCL (CARDIAC) PF 100 MG/5ML IV SOSY
PREFILLED_SYRINGE | INTRAVENOUS | Status: DC | PRN
  Administered 2020-04-07: 50 mg via INTRAVENOUS

## 2020-04-07 MED ORDER — PROPOFOL 10 MG/ML IV BOLUS
INTRAVENOUS | Status: DC | PRN
  Administered 2020-04-07: 50 mg via INTRAVENOUS

## 2020-04-07 NOTE — Anesthesia Preprocedure Evaluation (Signed)
Anesthesia Evaluation  Patient identified by MRN, date of birth, ID band Patient awake    Reviewed: Allergy & Precautions, H&P , NPO status , reviewed documented beta blocker date and time   Airway Mallampati: II  TM Distance: >3 FB Neck ROM: limited    Dental  (+) Partial Upper, Chipped, Missing   Pulmonary former smoker,    Pulmonary exam normal        Cardiovascular hypertension, Normal cardiovascular exam     Neuro/Psych PSYCHIATRIC DISORDERS Anxiety Depression  Neuromuscular disease    GI/Hepatic GERD  Medicated and Controlled,  Endo/Other    Renal/GU      Musculoskeletal  (+) Arthritis ,   Abdominal   Peds  Hematology   Anesthesia Other Findings Past Medical History: No date: Anxiety No date: Arthritis No date: Depression No date: GERD (gastroesophageal reflux disease) No date: Hyperlipidemia No date: Parkinsonism [facility])  Past Surgical History: 1973: BREAST BIOPSY; Left     Comment:  neg 2010: KNEE ARTHROSCOPY; Right 2012: KNEE ARTHROSCOPY; Left     Reproductive/Obstetrics                             Anesthesia Physical Anesthesia Plan  ASA: III  Anesthesia Plan: General   Post-op Pain Management:    Induction: Intravenous  PONV Risk Score and Plan: Treatment may vary due to age or medical condition and TIVA  Airway Management Planned: Nasal Cannula and Natural Airway  Additional Equipment:   Intra-op Plan:   Post-operative Plan:   Informed Consent: I have reviewed the patients History and Physical, chart, labs and discussed the procedure including the risks, benefits and alternatives for the proposed anesthesia with the patient or authorized representative who has indicated his/her understanding and acceptance.     Dental Advisory Given  Plan Discussed with: CRNA  Anesthesia Plan Comments:         Anesthesia Quick Evaluation

## 2020-04-07 NOTE — H&P (Signed)
Hannah Howard [id number] 05-01-1941     HPI:  79 y/o woman with past history of colonic polyps. For repeat exam. Prep was harder this time, but no vomiting.    Facility-Administered Medications Prior to Admission  Medication Dose Route Frequency Provider Last Rate Last Admin  . Dupilumab SOPN 300 mg  300 mg Subcutaneous Q14 Days Ralene Bathe, MD   300 mg at 02/15/20 1230   Medications Prior to Admission  Medication Sig Dispense Refill Last Dose  . Apremilast (OTEZLA) 10 & 20 & 30 MG TBPK Take 1 po bid. Lot# [id number] Exp 06/2021 x 2 packs 1 each 0 04/06/2020 at Unknown time  . aspirin EC 81 MG tablet Take by mouth.   Past Week at Unknown time  . bisoprolol-hydrochlorothiazide (ZIAC) 2.5-6.25 MG tablet Take 1 tablet by mouth daily.   Past Week at Unknown time  . carbidopa-levodopa (SINEMET CR) 50-200 MG tablet Take 1 tablet by mouth at bedtime.   Past Week at Unknown time  . carbidopa-levodopa (SINEMET IR) 25-250 MG tablet Take 1 tablet by mouth 3 (three) times daily.   Past Week at Unknown time  . clobetasol cream (TEMOVATE) 0.05 % APPLY TOPICALLY TWICE DAILY AS DIRECTED   Past Week at Unknown time  . clotrimazole-betamethasone (LOTRISONE) cream APPLY TOPICALLY TWO TIMES DAILY   Past Week at Unknown time  . docusate sodium (STOOL SOFTENER) 100 MG capsule Take by mouth.   Past Week at Unknown time  . estradiol (ESTRACE VAGINAL) 0.1 MG/GM vaginal cream Apply 0.5mg  (pea-sized amount)  just inside the vaginal introitus with a finger-tip on Monday, Wednesday and Friday nights. 30 g 12 Past Week at Unknown time  . hydrOXYzine (ATARAX/VISTARIL) 10 MG tablet Take 10-20 mg by mouth at bedtime as needed.   Past Week at Unknown time  . hydrOXYzine (ATARAX/VISTARIL) 50 MG tablet TAKE ONE TABLET 3 TIMES DAILY AS NEEDED FOR ITCHING   Past Week at Unknown time  . lansoprazole (PREVACID) 30 MG capsule TAKE 1 CAPSULE BY MOUTH EVERY DAY   Past Week at Unknown time  . levocetirizine (XYZAL) 5 MG tablet Take  5 mg by mouth at bedtime.   Past Week at Unknown time  . mometasone (ELOCON) 0.1 % lotion Apply  a small amount  at bedtime   Past Week at Unknown time  . montelukast (SINGULAIR) 10 MG tablet TAKE ONE TABLET EVERY DAY 90 tablet 2 Past Week at Unknown time  . Multiple Vitamin (MULTIVITAMIN) capsule Take by mouth.   Past Week at Unknown time  . predniSONE (DELTASONE) 20 MG tablet TAKE 3 TABLETS FOR 2 DAYS, 2 TABLETS FOR 3 DAYS, 1 TABLET FOR 4 DAYS THEN 1 TABLET EVERY OTHER DAY FOR 4 DOSES TAKE IN THE MORNING   04/06/2020 at Unknown time  . sertraline (ZOLOFT) 100 MG tablet TAKE ONE TABLET BY MOUTH EVERY DAY   04/06/2020 at Unknown time  . solifenacin (VESICARE) 10 MG tablet Take 10 mg by mouth daily.   04/06/2020 at Unknown time  . traZODone (DESYREL) 150 MG tablet TAKE ONE TABLET BY MOUTH AT BEDTIME   04/06/2020 at Unknown time  . triamcinolone cream (KENALOG) 0.1 % Apply topically 2 (two) times daily. Avoid face, groin, underarms. 454 g 1 Past Week at Unknown time  . celecoxib (CELEBREX) 200 MG capsule TAKE 1 CAPSULE BY MOUTH EVERY DAY   Not Taking at Unknown time  . conjugated estrogens (PREMARIN) vaginal cream Apply 0.5mg  (pea-sized amount)  just inside the vaginal  introitus with a finger-tip on  Monday, Wednesday and Friday nights. (Patient not taking: Reported on 04/07/2020) 30 g 12 Not Taking at Unknown time  . DIFLUCAN 200 MG tablet Take 200 mg by mouth 3 (three) times daily.   Completed Course at Unknown time  . tolterodine (DETROL LA) 4 MG 24 hr capsule Take 1 capsule (4 mg total) by mouth daily. (Patient not taking: Reported on 04/07/2020) 30 capsule 11 Not Taking at Unknown time   Allergies  Allergen Reactions  . Amoxicillin Rash  . Amoxicillin-Pot Clavulanate Rash  . Bronopol Rash    Patch test proven Allergic Contact dermatitis to Bronopol in patients soap products   Past Medical History:  Diagnosis Date  . Anxiety   . Arthritis   . Depression   . GERD (gastroesophageal reflux  disease)   . Hyperlipidemia   . Parkinsonism [facility])    Past Surgical History:  Procedure Laterality Date  . BREAST BIOPSY Left 1973   neg  . KNEE ARTHROSCOPY Right 2010  . KNEE ARTHROSCOPY Left 2012   Social History   Socioeconomic History  . Marital status: Divorced    Spouse name: Not on file  . Number of children: Not on file  . Years of education: Not on file  . Highest education level: Not on file  Occupational History  . Not on file  Tobacco Use  . Smoking status: Former Smoker    Types: Cigarettes    Quit date: 07/02/2000    Years since quitting: 19.7  . Smokeless tobacco: Never Used  Substance and Sexual Activity  . Alcohol use: No    Alcohol/week: 0.0 standard drinks  . Drug use: No  . Sexual activity: Not on file  Other Topics Concern  . Not on file  Social History Narrative  . Not on file   Social Determinants of Health   Financial Resource Strain:   . Difficulty of Paying Living Expenses:   Food Insecurity:   . Worried About Charity fundraiser in the Last Year:   . Arboriculturist in the Last Year:   Transportation Needs:   . Film/video editor (Medical):   Marland Kitchen Lack of Transportation (Non-Medical):   Physical Activity:   . Days of Exercise per Week:   . Minutes of Exercise per Session:   Stress:   . Feeling of Stress :   Social Connections:   . Frequency of Communication with Friends and Family:   . Frequency of Social Gatherings with Friends and Family:   . Attends Religious Services:   . Active Member of Clubs or Organizations:   . Attends Archivist Meetings:   Marland Kitchen Marital Status:   Intimate Partner Violence:   . Fear of Current or Ex-Partner:   . Emotionally Abused:   Marland Kitchen Physically Abused:   . Sexually Abused:    Social History   Social History Narrative  . Not on file     ROS: Negative.     PE: HEENT: Negative. Lungs: Clear. Cardio: RR.    Assessment/Plan:  Summary of evaluation:  - CSY: 11/2014 - one  diminutive polyp in ascending colon with path showing tubular adenoma, non-bleeding internal hemorrhoids  - CSY: 09/2009 - diverticulosis in sigmoid and descending colon, non-bleeding internal hemorrhoids - CSY: 08/2006 - 6 mm polyp in transverse colon with path showing TVA, non-bleeding IH  Proceed with planned endoscopy.  Forest Gleason [facility] 04/07/2020

## 2020-04-07 NOTE — Transfer of Care (Signed)
Immediate Anesthesia Transfer of Care Note  Patient: Hannah Howard  Procedure(s) Performed: COLONOSCOPY WITH PROPOFOL (N/A )  Patient Location: PACU and Endoscopy Unit  Anesthesia Type:General  Level of Consciousness: sedated  Airway & Oxygen Therapy: Patient Spontanous Breathing  Post-op Assessment: Report given to RN  Post vital signs: stable  Last Vitals:  Vitals Value Taken Time  BP    Temp    Pulse    Resp    SpO2      Last Pain:  Vitals:   04/07/20 1117  TempSrc: Temporal  PainSc: 0-No pain         Complications: No apparent anesthesia complications

## 2020-04-07 NOTE — Op Note (Addendum)
[facility] Patient Name: Hannah Howard Procedure Date: 04/07/2020 11:09 AM MRN: [id number] Account #: [id number] Date of Birth: 01-22-1941 Admit Type: Outpatient Age: 79 Room: [facility] ENDO ROOM 1 Gender: Female Note Status: Finalized Procedure:             Colonoscopy Indications:           High risk colon cancer surveillance: Personal history                         of colonic polyps Providers:             Robert Bellow, MD Medicines:             Monitored Anesthesia Care Complications:         No immediate complications. Procedure:             Pre-Anesthesia Assessment:                        - Prior to the procedure, a History and Physical was                         performed, and patient medications, allergies and                         sensitivities were reviewed. The patient's tolerance                         of previous anesthesia was reviewed.                        - The risks and benefits of the procedure and the                         sedation options and risks were discussed with the                         patient. All questions were answered and informed                         consent was obtained.                        After obtaining informed consent, the colonoscope was                         passed under direct vision. Throughout the procedure,                         the patient's blood pressure, pulse, and oxygen                         saturations were monitored continuously. The                         Colonoscope was introduced through the anus and                         advanced to the the cecum, identified by appendiceal  orifice and ileocecal valve. The colonoscopy was                         somewhat difficult due to a tortuous colon. The                         patient tolerated the procedure well. The quality of                         the bowel preparation was adequate to identify  polyps. Findings:      A few small-mouthed diverticula were found in the sigmoid colon.      The retroflexed view of the distal rectum and anal verge was normal and       showed no anal or rectal abnormalities.      The retroflexed view of the distal rectum and anal verge was normal and       showed no anal or rectal abnormalities. Impression:            - Diverticulosis in the sigmoid colon.                        - The distal rectum and anal verge are normal on                         retroflexion view.                        - The distal rectum and anal verge are normal on                         retroflexion view.                        - No specimens collected. Recommendation:        - Discharge patient to home (via wheelchair). Procedure Code(s):     --- Professional ---                        [phone], Colonoscopy, flexible; diagnostic, including                         collection of specimen(s) by brushing or washing, when                         performed (separate procedure) Diagnosis Code(s):     --- Professional ---                        K57.30, Diverticulosis of large intestine without                         perforation or abscess without bleeding                        Z86.010, Personal history of colonic polyps CPT copyright 2019 American Medical Association. All rights reserved. The codes documented in this report are preliminary and upon coder review may  be revised to meet current compliance requirements. Robert Bellow, MD 04/07/2020 12:30:00 PM This report has been signed electronically. Number of Addenda: 0 Note Initiated On:  04/07/2020 11:09 AM Scope Withdrawal Time: 0 hours 20 minutes 6 seconds  Total Procedure Duration: 0 hours 40 minutes 48 seconds       [facility]

## 2020-04-13 NOTE — Anesthesia Postprocedure Evaluation (Signed)
Anesthesia Post Note  Patient: Hannah Howard  Procedure(s) Performed: COLONOSCOPY WITH PROPOFOL (N/A )  Patient location during evaluation: Endoscopy Anesthesia Type: General Level of consciousness: awake and alert Pain management: pain level controlled Vital Signs Assessment: post-procedure vital signs reviewed and stable Respiratory status: spontaneous breathing, nonlabored ventilation and respiratory function stable Cardiovascular status: blood pressure returned to baseline and stable Postop Assessment: no apparent nausea or vomiting Anesthetic complications: no     Last Vitals:  Vitals:   04/07/20 1254 04/07/20 1304  BP: (!) 162/80 (!) 170/60  Pulse: 80 78  Resp: 19 19  Temp:    SpO2: 100% 99%    Last Pain:  Vitals:   04/07/20 1304  TempSrc:   PainSc: 0-No pain                 Alphonsus Sias

## 2020-05-17 ENCOUNTER — Ambulatory Visit
Admission: RE | Admit: 2020-05-17 | Discharge: 2020-05-17 | Disposition: A | Discharge status: Home / Self Care | Payer: Medicare Other | Source: Ambulatory Visit | Attending: Internal Medicine | Admitting: Internal Medicine

## 2020-05-17 DIAGNOSIS — Z1231 Encounter for screening mammogram for malignant neoplasm of breast: Secondary | ICD-10-CM | POA: Diagnosis present

## 2020-05-22 ENCOUNTER — Other Ambulatory Visit: Payer: Self-pay | Admitting: Internal Medicine

## 2020-05-22 DIAGNOSIS — R928 Other abnormal and inconclusive findings on diagnostic imaging of breast: Secondary | ICD-10-CM

## 2020-05-22 DIAGNOSIS — N6489 Other specified disorders of breast: Secondary | ICD-10-CM

## 2020-05-26 ENCOUNTER — Ambulatory Visit
Admission: RE | Admit: 2020-05-26 | Discharge: 2020-05-26 | Disposition: A | Discharge status: Home / Self Care | Payer: Medicare Other | Source: Ambulatory Visit | Attending: Internal Medicine | Admitting: Internal Medicine

## 2020-05-26 DIAGNOSIS — R928 Other abnormal and inconclusive findings on diagnostic imaging of breast: Secondary | ICD-10-CM | POA: Diagnosis present

## 2020-05-26 DIAGNOSIS — N6489 Other specified disorders of breast: Secondary | ICD-10-CM | POA: Diagnosis present

## 2020-08-15 IMAGING — CT CT HIP*R* W/O CM
2 of 4 series · 17 of 46 positions shown, 19 images · non-contrast
Comparison: None.

CLINICAL DATA: Right hip pain since a fall 2 days ago.

EXAM:
CT OF THE RIGHT HIP WITHOUT CONTRAST
TECHNIQUE: Multidetector CT imaging of the right hip was performed according to
the standard protocol. Multiplanar CT image reconstructions were
also generated.

[Series 5: axial st · axial · 0.40mm/px · z∈[+1096,+1236]mm · 14 of 162 slices shown, 16 images]
[im 11/162  soft-tissue]
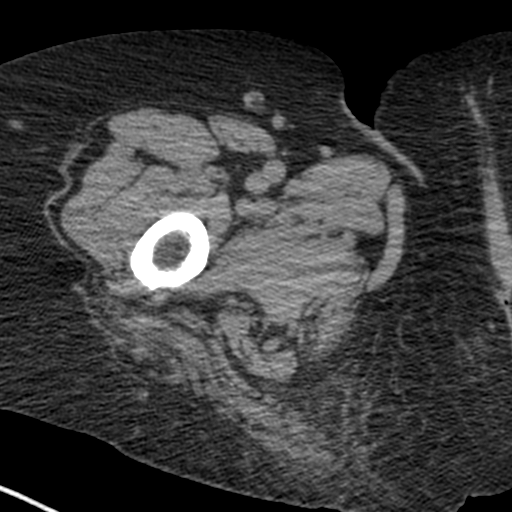
[im 11/162  bone]
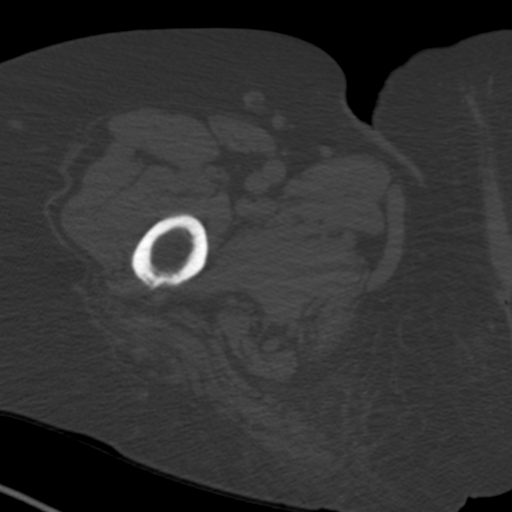
[im 21/162  soft-tissue]
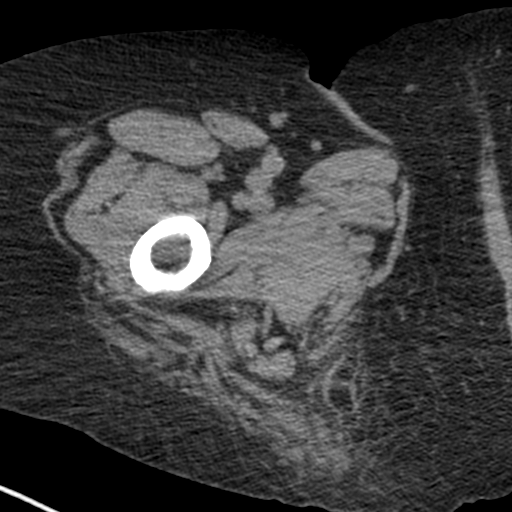
[im 32/162  soft-tissue]
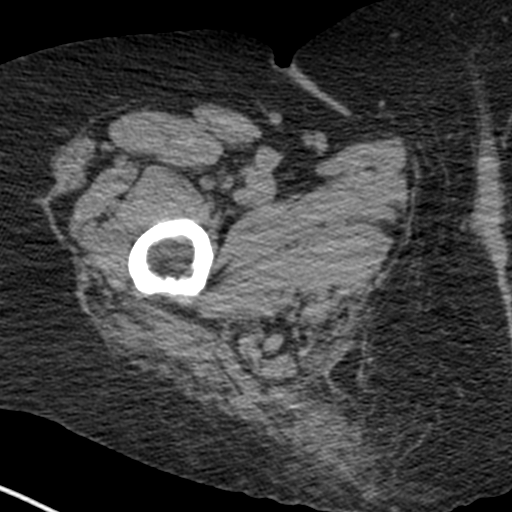
[im 42/162  soft-tissue]
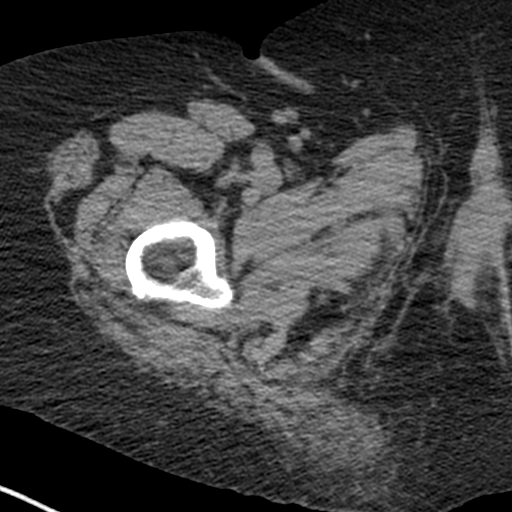
[im 52/162  soft-tissue]
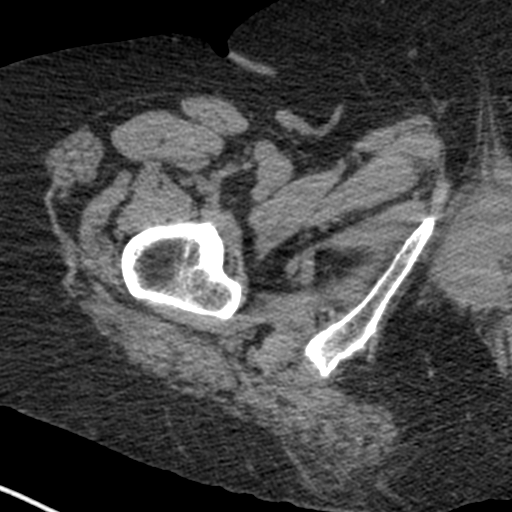
[im 63/162  soft-tissue]
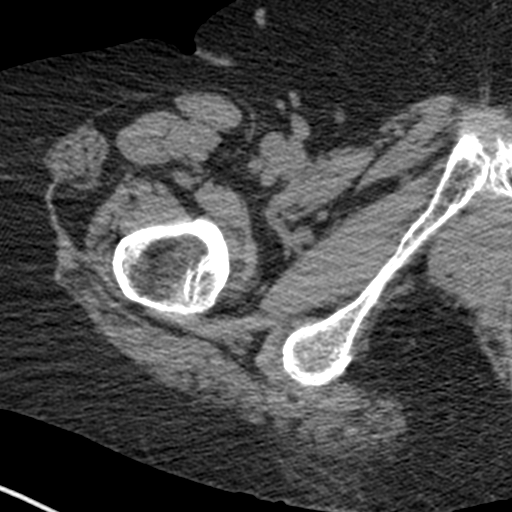
[im 73/162  soft-tissue]
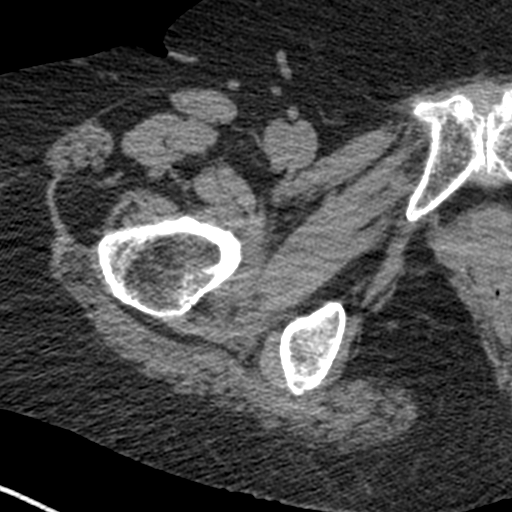
[im 89/162  soft-tissue]
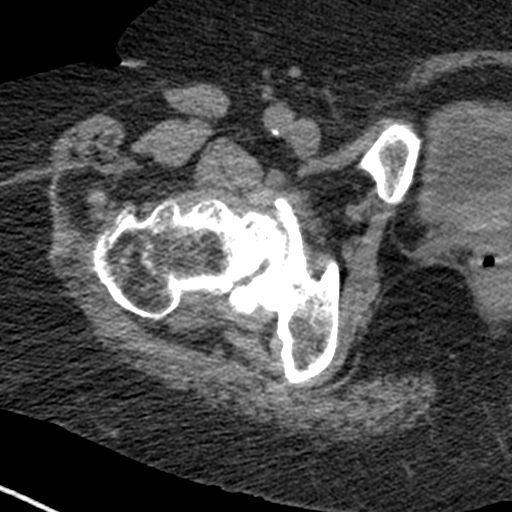
[im 99/162  soft-tissue]
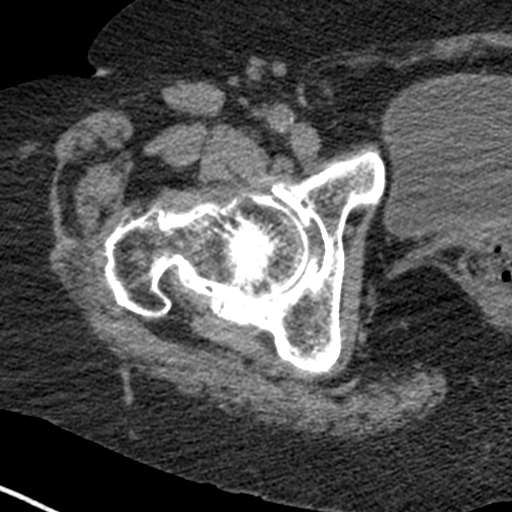
[im 99/162  bone]
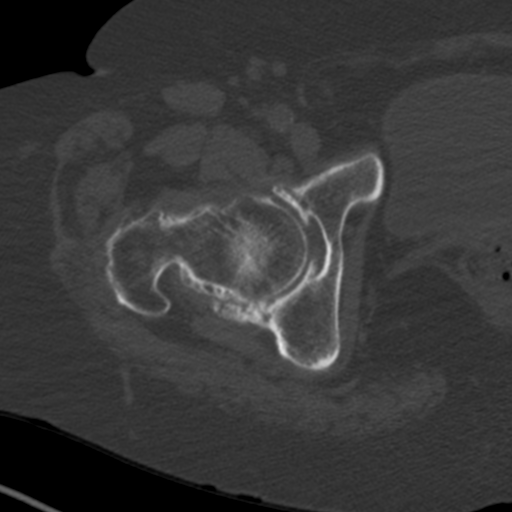
[im 110/162  soft-tissue]
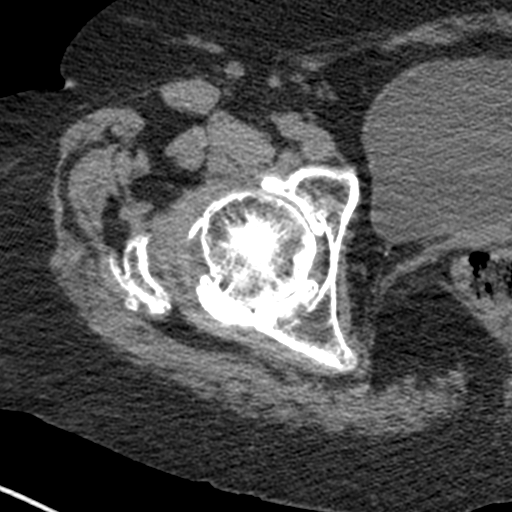
[im 120/162  soft-tissue]
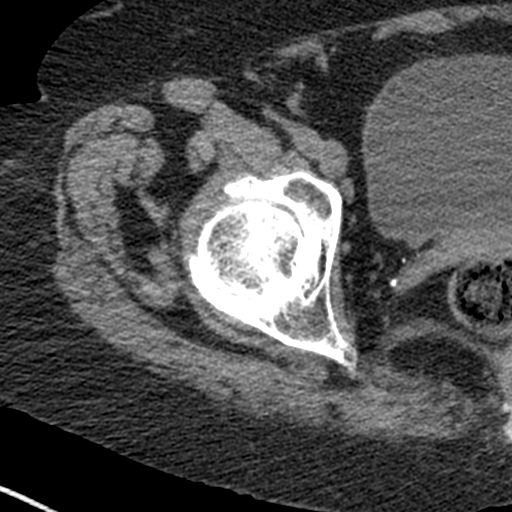
[im 130/162  soft-tissue]
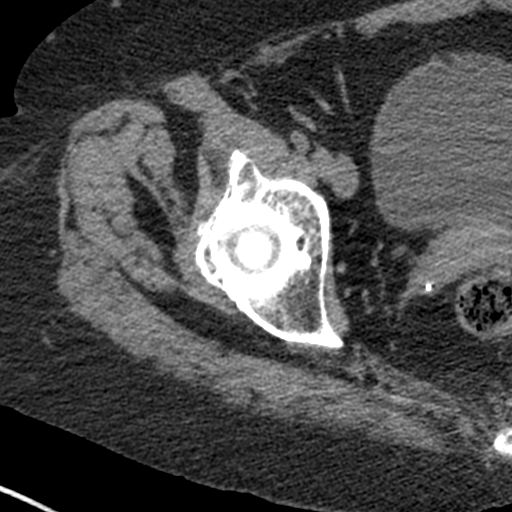
[im 141/162  soft-tissue]
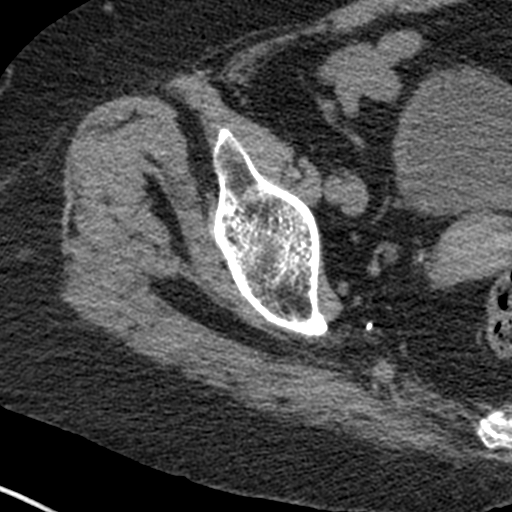
[im 151/162  soft-tissue]
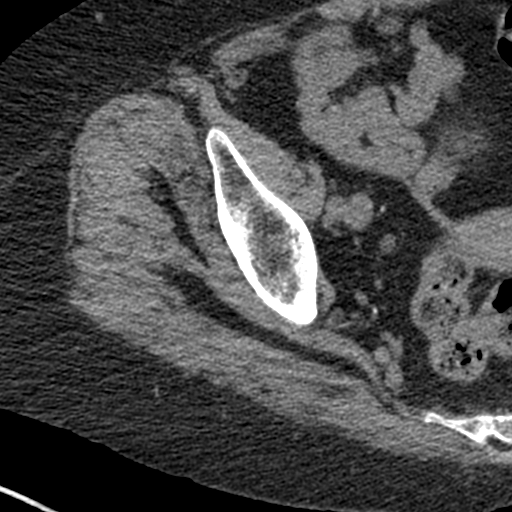

[Series 8: coronal st · coronal · 0.36mm/px · 3 of 92 slices shown]
[im 23/92  soft-tissue]
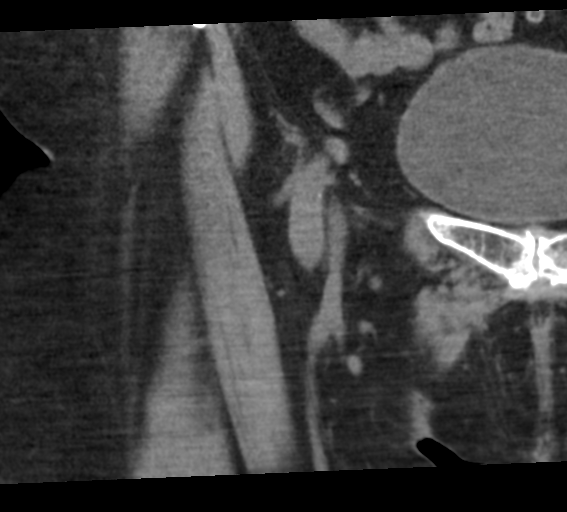
[im 46/92  soft-tissue]
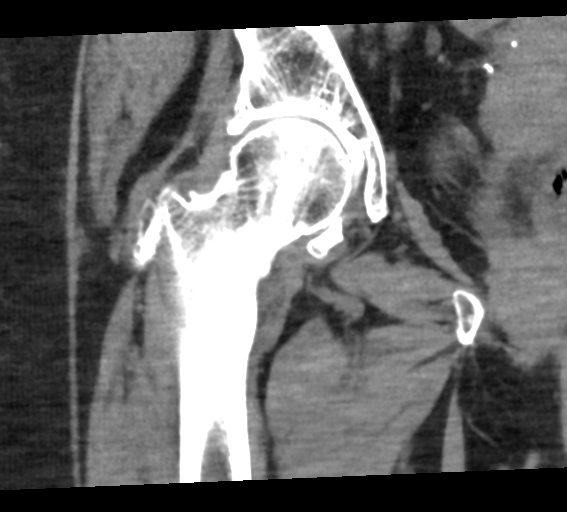
[im 69/92  soft-tissue]
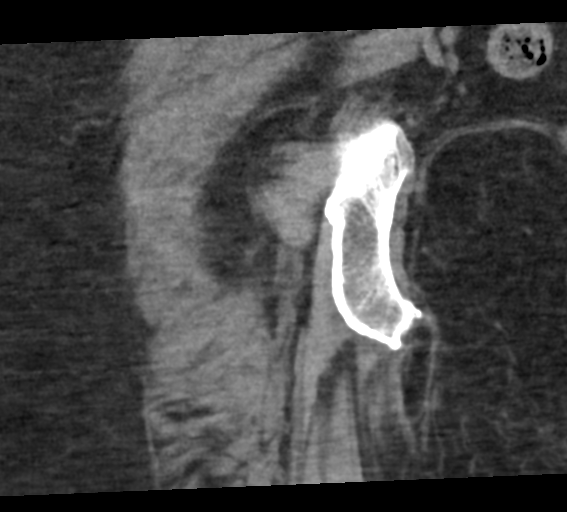

[17 of 46 positions shown; findings below may reference images not displayed]

FINDINGS: Bones/Joint/Cartilage

There is no fracture or dislocation of the right hip. The patient
has severe osteoarthritis of the right hip joint with joint space
narrowing and prominent marginal osteophytes in the femoral head and
acetabulum.

Muscles and Tendons

Normal.

Soft tissues

Normal.
IMPRESSION: 1. No fracture or dislocation of the right hip joint.
2. Severe osteoarthritis of the right hip joint.

## 2020-09-06 ENCOUNTER — Other Ambulatory Visit: Payer: Self-pay | Admitting: Ophthalmology

## 2020-09-12 ENCOUNTER — Other Ambulatory Visit: Payer: Self-pay

## 2020-09-12 ENCOUNTER — Encounter: Payer: Self-pay | Admitting: Ophthalmology

## 2020-09-13 ENCOUNTER — Encounter: Payer: Medicare Other | Attending: Internal Medicine | Admitting: Internal Medicine

## 2020-09-13 DIAGNOSIS — Z880 Allergy status to penicillin: Secondary | ICD-10-CM | POA: Diagnosis not present

## 2020-09-13 DIAGNOSIS — S81802A Unspecified open wound, left lower leg, initial encounter: Secondary | ICD-10-CM | POA: Insufficient documentation

## 2020-09-13 DIAGNOSIS — W228XXA Striking against or struck by other objects, initial encounter: Secondary | ICD-10-CM | POA: Diagnosis not present

## 2020-09-13 DIAGNOSIS — Z87891 Personal history of nicotine dependence: Secondary | ICD-10-CM | POA: Diagnosis not present

## 2020-09-13 NOTE — Progress Notes (Signed)
MARLYNN, HINCKLEY ([id number]) Visit Report for 09/13/2020 Abuse/Suicide Risk Screen Details Patient Name: Hannah Howard, Hannah Howard. Date of Service: 09/13/2020 9:45 AM Medical Record Number: [id number] Patient Account Number: [id number] Date of Birth/Sex: 06-21-1941 (79 y.o. F) Treating RN: Carlene Coria Primary Care Doreather Hoxworth: Fulton Reek Other Clinician: Referring Maxime Beckner: Fulton Reek Treating Rosenda Geffrard/Extender: Tito Dine in Treatment: 0 Abuse/Suicide Risk Screen Items Answer ABUSE RISK SCREEN: Has anyone close to you tried to hurt or harm you recentlyo No Do you feel uncomfortable with anyone in your familyo No Has anyone forced you do things that you didnot want to doo No Electronic Signature(s) Signed: 09/13/2020 4:54:21 PM By: Carlene Coria RN Entered By: Carlene Coria on 09/13/2020 51:88:41 Hannah Howard ([id number]) -------------------------------------------------------------------------------- Activities of Daily Living Details Patient Name: Hannah Howard. Date of Service: 09/13/2020 9:45 AM Medical Record Number: [id number] Patient Account Number: [id number] Date of Birth/Sex: 1941-10-24 (79 y.o. F) Treating RN: Carlene Coria Primary Care Soniya Ashraf: Fulton Reek Other Clinician: Referring Vail Vuncannon: Fulton Reek Treating Micai Apolinar/Extender: Tito Dine in Treatment: 0 Activities of Daily Living Items Answer Activities of Daily Living (Please select one for each item) Drive Automobile Not Able Take Medications Completely Able Use Telephone Completely Able Care for Appearance Completely Able Use Toilet Completely Able Bath / Shower Completely Able Dress Self Completely Able Feed Self Completely Able Walk Completely Able Get In / Out Bed Completely Able Housework Completely Able Prepare Meals Completely Able Handle Money Completely Able Shop for Self Completely Able Electronic Signature(s) Signed: 09/13/2020 4:54:21 PM By: Carlene Coria  RN Entered By: Carlene Coria on 09/13/2020 32:20:25 Hannah Howard ([id number]) -------------------------------------------------------------------------------- Education Screening Details Patient Name: Hannah Howard. Date of Service: 09/13/2020 9:45 AM Medical Record Number: [id number] Patient Account Number: [id number] Date of Birth/Sex: [birth year] (79 y.o. F) Treating RN: Carlene Coria Primary Care Marylen Zuk: Fulton Reek Other Clinician: Referring Jossie Smoot: Fulton Reek Treating Orelia Brandstetter/Extender: Tito Dine in Treatment: 0 Primary Learner Assessed: Patient Learning Preferences/Education Level/Primary Language Learning Preference: Explanation Highest Education Level: College or Above Preferred Language: English Cognitive Barrier Language Barrier: No Translator Needed: No Memory Deficit: No Emotional Barrier: No Cultural/Religious Beliefs Affecting Medical Care: No Physical Barrier Impaired Vision: No Impaired Hearing: No Decreased Hand dexterity: No Knowledge/Comprehension Knowledge Level: Medium Comprehension Level: High Ability to understand written instructions: High Ability to understand verbal instructions: High Motivation Anxiety Level: Anxious Cooperation: Cooperative Education Importance: Acknowledges Need Interest in Health Problems: Asks Questions Perception: Coherent Willingness to Engage in Self-Management High Activities: Readiness to Engage in Self-Management High Activities: Electronic Signature(s) Signed: 09/13/2020 4:54:21 PM By: Carlene Coria RN Entered By: Carlene Coria on 09/13/2020 60:73:71 Hannah Howard ([id number]) -------------------------------------------------------------------------------- Fall Risk Assessment Details Patient Name: Hannah Howard. Date of Service: 09/13/2020 9:45 AM Medical Record Number: [id number] Patient Account Number: [id number] Date of Birth/Sex: 05/13/1941 (79 y.o. F) Treating RN: Carlene Coria Primary Care Seidy Labreck: Fulton Reek Other Clinician: Referring Eliette Drumwright: Fulton Reek Treating Peirce Deveney/Extender: Tito Dine in Treatment: 0 Fall Risk Assessment Items Have you had 2 or more falls in the last 12 monthso 0 No Have you had any fall that resulted in injury in the last 12 monthso 0 No FALLS RISK SCREEN History of falling - immediate or within 3 months 0 No Secondary diagnosis (Do you have 2 or more medical diagnoseso) 0 No Ambulatory aid None/bed rest/wheelchair/nurse 0 No Crutches/cane/walker 0 No Furniture 0 No Intravenous therapy Access/Saline/Heparin Lock 0 No Gait/Transferring Normal/ bed rest/ wheelchair 0 No  Weak (short steps with or without shuffle, stooped but able to lift head while walking, may 0 No seek support from furniture) Impaired (short steps with shuffle, may have difficulty arising from chair, head down, impaired 0 No balance) Mental Status Oriented to own ability 0 No Electronic Signature(s) Signed: 09/13/2020 4:54:21 PM By: Carlene Coria RN Entered By: Carlene Coria on 09/13/2020 46:56:81 Hannah Howard ([id number]) -------------------------------------------------------------------------------- Foot Assessment Details Patient Name: Hannah Howard. Date of Service: 09/13/2020 9:45 AM Medical Record Number: [id number] Patient Account Number: [id number] Date of Birth/Sex: Jun 06, 1941 (79 y.o. F) Treating RN: Carlene Coria Primary Care Arthelia Callicott: Fulton Reek Other Clinician: Referring Senora Lacson: Fulton Reek Treating Lynton Crescenzo/Extender: Tito Dine in Treatment: 0 Foot Assessment Items Site Locations + = Sensation present, - = Sensation absent, C = Callus, U = Ulcer R = Redness, W = Warmth, M = Maceration, PU = Pre-ulcerative lesion F = Fissure, S = Swelling, D = Dryness Assessment Right: Left: Other Deformity: No No Prior Foot Ulcer: No No Prior Amputation: No No Charcot Joint: No No Ambulatory  Status: Ambulatory Without Help Gait: Steady Electronic Signature(s) Signed: 09/13/2020 4:54:21 PM By: Carlene Coria RN Entered By: Carlene Coria on 09/13/2020 16:38:46 Hannah Howard ([id number]) -------------------------------------------------------------------------------- Nutrition Risk Screening Details Patient Name: Hannah Howard. Date of Service: 09/13/2020 9:45 AM Medical Record Number: [id number] Patient Account Number: [id number] Date of Birth/Sex: 13-Oct-1941 (79 y.o. F) Treating RN: Carlene Coria Primary Care Abbie Jablon: Fulton Reek Other Clinician: Referring Danene Montijo: Fulton Reek Treating Ayyan Sites/Extender: Tito Dine in Treatment: 0 Height (in): 67 Weight (lbs): 200 Body Mass Index (BMI): 31.3 Nutrition Risk Screening Items Score Screening NUTRITION RISK SCREEN: I have an illness or condition that made me change the kind and/or amount of food I eat 0 No I eat fewer than two meals per day 0 No I eat few fruits and vegetables, or milk products 0 No I have three or more drinks of beer, liquor or wine almost every day 0 No I have tooth or mouth problems that make it hard for me to eat 0 No I don't always have enough money to buy the food I need 0 No I eat alone most of the time 0 No I take three or more different prescribed or over-the-counter drugs a day 1 Yes Without wanting to, I have lost or gained 10 pounds in the last six months 0 No I am not always physically able to shop, cook and/or feed myself 0 No Nutrition Protocols Good Risk Protocol 0 No interventions needed Moderate Risk Protocol High Risk Proctocol Risk Level: Good Risk Score: 1 Electronic Signature(s) Signed: 09/13/2020 4:54:21 PM By: Carlene Coria RN Entered By: Carlene Coria on 09/13/2020 10:26:04

## 2020-09-15 ENCOUNTER — Other Ambulatory Visit: Payer: Self-pay

## 2020-09-15 ENCOUNTER — Other Ambulatory Visit
Admission: RE | Admit: 2020-09-15 | Discharge: 2020-09-15 | Disposition: A | Discharge status: Home / Self Care | Payer: Medicare Other | Source: Ambulatory Visit | Attending: Ophthalmology | Admitting: Ophthalmology

## 2020-09-15 DIAGNOSIS — Z01812 Encounter for preprocedural laboratory examination: Secondary | ICD-10-CM | POA: Insufficient documentation

## 2020-09-15 DIAGNOSIS — Z20822 Contact with and (suspected) exposure to covid-19: Secondary | ICD-10-CM | POA: Insufficient documentation

## 2020-09-15 NOTE — Anesthesia Preprocedure Evaluation (Addendum)
Anesthesia Evaluation  Patient identified by MRN, date of birth, ID band Patient awake    Reviewed: Allergy & Precautions, NPO status , Patient's Chart, lab work & pertinent test results, reviewed documented beta blocker date and time   History of Anesthesia Complications Negative for: history of anesthetic complications  Airway Mallampati: III  TM Distance: >3 FB Neck ROM: Limited  Mouth opening: Limited Mouth Opening  Dental  (+) Partial Upper   Pulmonary former smoker,    breath sounds clear to auscultation       Cardiovascular Exercise Tolerance: Poor hypertension, (-) angina+ DOE (Chronic, stable)   Rhythm:Regular Rate:Normal   HLD   Neuro/Psych  Headaches, PSYCHIATRIC DISORDERS Anxiety Depression  Parkinson's    GI/Hepatic GERD  ,  Endo/Other    Renal/GU      Musculoskeletal  (+) Arthritis ,   Abdominal (+) + obese (BMI 34),   Peds  Hematology   Anesthesia Other Findings   Reproductive/Obstetrics                            Anesthesia Physical Anesthesia Plan  ASA: III  Anesthesia Plan: MAC   Post-op Pain Management:    Induction: Intravenous  PONV Risk Score and Plan: 2 and TIVA, Midazolam and Treatment may vary due to age or medical condition  Airway Management Planned: Nasal Cannula  Additional Equipment:   Intra-op Plan:   Post-operative Plan:   Informed Consent: I have reviewed the patients History and Physical, chart, labs and discussed the procedure including the risks, benefits and alternatives for the proposed anesthesia with the patient or authorized representative who has indicated his/her understanding and acceptance.       Plan Discussed with: CRNA and Anesthesiologist  Anesthesia Plan Comments:         Anesthesia Quick Evaluation

## 2020-09-15 NOTE — Discharge Instructions (Signed)
General Anesthesia, Adult, Care After This sheet gives you information about how to care for yourself after your procedure. Your health care provider may also give you more specific instructions. If you have problems or questions, contact your health care provider. What can I expect after the procedure? After the procedure, the following side effects are common:  Pain or discomfort at the IV site.  Nausea.  Vomiting.  Sore throat.  Trouble concentrating.  Feeling cold or chills.  Weak or tired.  Sleepiness and fatigue.  Soreness and body aches. These side effects can affect parts of the body that were not involved in surgery. Follow these instructions at home:  For at least 24 hours after the procedure:  Have a responsible adult stay with you. It is important to have someone help care for you until you are awake and alert.  Rest as needed.  Do not: ? Participate in activities in which you could fall or become injured. ? Drive. ? Use heavy machinery. ? Drink alcohol. ? Take sleeping pills or medicines that cause drowsiness. ? Make important decisions or sign legal documents. ? Take care of children on your own. Eating and drinking  Follow any instructions from your health care provider about eating or drinking restrictions.  When you feel hungry, start by eating small amounts of foods that are soft and easy to digest (bland), such as toast. Gradually return to your regular diet.  Drink enough fluid to keep your urine pale yellow.  If you vomit, rehydrate by drinking water, juice, or clear broth. General instructions  If you have sleep apnea, surgery and certain medicines can increase your risk for breathing problems. Follow instructions from your health care provider about wearing your sleep device: ? Anytime you are sleeping, including during daytime naps. ? While taking prescription pain medicines, sleeping medicines, or medicines that make you drowsy.  Return to  your normal activities as told by your health care provider. Ask your health care provider what activities are safe for you.  Take over-the-counter and prescription medicines only as told by your health care provider.  If you smoke, do not smoke without supervision.  Keep all follow-up visits as told by your health care provider. This is important. Contact a health care provider if:  You have nausea or vomiting that does not get better with medicine.  You cannot eat or drink without vomiting.  You have pain that does not get better with medicine.  You are unable to pass urine.  You develop a skin rash.  You have a fever.  You have redness around your IV site that gets worse. Get help right away if:  You have difficulty breathing.  You have chest pain.  You have blood in your urine or stool, or you vomit blood. Summary  After the procedure, it is common to have a sore throat or nausea. It is also common to feel tired.  Have a responsible adult stay with you for the first 24 hours after general anesthesia. It is important to have someone help care for you until you are awake and alert.  When you feel hungry, start by eating small amounts of foods that are soft and easy to digest (bland), such as toast. Gradually return to your regular diet.  Drink enough fluid to keep your urine pale yellow.  Return to your normal activities as told by your health care provider. Ask your health care provider what activities are safe for you. This information is not   intended to replace advice given to you by your health care provider. Make sure you discuss any questions you have with your health care provider. Document Revised: 11/14/2017 Document Reviewed: 06/27/2017 Elsevier Patient Education  2020 [employer]  Cataract Surgery, Care After This sheet gives you information about how to care for yourself after your procedure. Your health care provider may also give you more specific  instructions. If you have problems or questions, contact your health care provider. What can I expect after the procedure? After the procedure, it is common to have:  Itching.  Discomfort.  Fluid discharge.  Sensitivity to light and to touch.  Bruising in or around the eye.  Mild blurred vision. Follow these instructions at home: Eye care   Do not touch or rub your eyes.  Protect your eyes as told by your health care provider. You may be told to wear a protective eye shield or sunglasses.  Do not put a contact lens into the affected eye or eyes until your health care provider approves.  Keep the area around your eye clean and dry: ? Avoid swimming. ? Do not allow water to hit you directly in the face while showering. ? Keep soap and shampoo out of your eyes.  Check your eye every day for signs of infection. Watch for: ? Redness, swelling, or pain. ? Fluid, blood, or pus. ? Warmth. ? A bad smell. ? Vision that is getting worse. ? Sensitivity that is getting worse. Activity  Do not drive for 24 hours if you were given a sedative during your procedure.  Avoid strenuous activities, such as playing contact sports, for as long as told by your health care provider.  Do not drive or use heavy machinery until your health care provider approves.  Do not bend or lift heavy objects. Bending increases pressure in the eye. You can walk, climb stairs, and do light household chores.  Ask your health care provider when you can return to work. If you work in a dusty environment, you may be advised to wear protective eyewear for a period of time. General instructions  Take or apply over-the-counter and prescription medicines only as told by your health care provider. This includes eye drops.  Keep all follow-up visits as told by your health care provider. This is important. Contact a health care provider if:  You have increased bruising around your eye.  You have pain that is  not helped with medicine.  You have a fever.  You have redness, swelling, or pain in your eye.  You have fluid, blood, or pus coming from your incision.  Your vision gets worse.  Your sensitivity to light gets worse. Get help right away if:  You have sudden loss of vision.  You see flashes of light or spots (floaters).  You have severe eye pain.  You develop nausea or vomiting. Summary  After your procedure, it is common to have itching, discomfort, bruising, fluid discharge, or sensitivity to light.  Follow instructions from your health care provider about caring for your eye after the procedure.  Do not rub your eye after the procedure. You may need to wear eye protection or sunglasses. Do not wear contact lenses. Keep the area around your eye clean and dry.  Avoid activities that require a lot of effort. These include playing sports and lifting heavy objects.  Contact a health care provider if you have increased bruising, pain that does not go away, or a fever. Get help right   away if you suddenly lose your vision, see flashes of light or spots, or have severe pain in the eye. This information is not intended to replace advice given to you by your health care provider. Make sure you discuss any questions you have with your health care provider. Document Revised: 09/07/2019 Document Reviewed: 05/11/2018 Elsevier Patient Education  2020 [employer]  

## 2020-09-15 NOTE — Progress Notes (Signed)
Hannah Howard, Hannah Howard ([id number]) Visit Report for 09/13/2020 Chief Complaint Document Details Patient Name: Hannah Howard, Hannah Howard. Date of Service: 09/13/2020 9:45 AM Medical Record Number: [id number] Patient Account Number: [id number] Date of Birth/Sex: 1941/10/24 (79 y.o. F) Treating RN: Cornell Barman Primary Care Provider: Fulton Reek Other Clinician: Referring Provider: Fulton Reek Treating Provider/Extender: Tito Dine in Treatment: 0 Information Obtained from: Patient Chief Complaint 09/13/2020; patient is here for review of a traumatic wound on the left anterior lower leg Electronic Signature(s) Signed: 09/15/2020 8:18:14 AM By: Linton Ham MD Entered By: Linton Ham on 09/13/2020 78:73:41 Hannah Howard ([id number]) -------------------------------------------------------------------------------- HPI Details Patient Name: Hannah Howard. Date of Service: 09/13/2020 9:45 AM Medical Record Number: [id number] Patient Account Number: [id number] Date of Birth/Sex: May 09, 1941 (79 y.o. F) Treating RN: Cornell Barman Primary Care Provider: Fulton Reek Other Clinician: Referring Provider: Fulton Reek Treating Provider/Extender: Tito Dine in Treatment: 0 History of Present Illness HPI Description: ADMISSION 09/13/2020 This is a 79 year old woman who arrives with a traumatic wound on the left anterior lower leg. She tells [place] about 5 or 6 weeks ago a Coke can fell out of her fridge and hit her on the anterior leg. She had a fair amount of bleeding however lately she has been trying to heal this with rubbing alcohol and then more recently Neosporin. She is referred here by her primary physician for review of this. Past medical history includes Parkinson's disease, coming up cataract extraction on 10/21, rash of unknown etiology saw dermatology in July 2021, COPD, anxiety and depression, diffuse pain. She is not a diabetic. She does tell us that she has a  lot of swelling in her legs over time her skin in the lower extremities is very tender ABI in our clinic was 1.14 Electronic Signature(s) Signed: 09/15/2020 8:18:14 AM By: Linton Ham MD Entered By: Linton Ham on 09/13/2020 78:83:41 Hannah Howard ([id number]) -------------------------------------------------------------------------------- Otelia Sergeant TISS Details Patient Name: Hannah Howard. Date of Service: 09/13/2020 9:45 AM Medical Record Number: [id number] Patient Account Number: [id number] Date of Birth/Sex: 10-27-1941 (79 y.o. F) Treating RN: Cornell Barman Primary Care Provider: Fulton Reek Other Clinician: Referring Provider: Fulton Reek Treating Provider/Extender: Tito Dine in Treatment: 0 Procedure Performed for: Wound #1 Left Lower Leg Performed By: Physician Ricard Dillon, MD Post Procedure Diagnosis Same as Pre-procedure Notes 1 silver nitrate stick used Electronic Signature(s) Signed: 09/15/2020 8:18:14 AM By: Linton Ham MD Entered By: Linton Ham on 09/13/2020 78:14:48 Hannah Howard ([id number]) -------------------------------------------------------------------------------- Physical Exam Details Patient Name: Hannah Howard Date of Service: 09/13/2020 9:45 AM Medical Record Number: [id number] Patient Account Number: [id number] Date of Birth/Sex: November 06, 1941 (79 y.o. F) Treating RN: Cornell Barman Primary Care Provider: Fulton Reek Other Clinician: Referring Provider: Fulton Reek Treating Provider/Extender: Tito Dine in Treatment: 0 Constitutional Sitting or standing Blood Pressure is within target range for patient.. Pulse regular and within target range for patient.Marland Kitchen Respirations regular, non- labored and within target range.. Temperature is normal and within the target range for the patient.Marland Kitchen appears in no distress. Respiratory Respiratory effort is easy and symmetric bilaterally. Rate  is normal at rest and on room air.. Bilateral breath sounds are clear and equal in all lobes with no wheezes, rales or rhonchi.. Cardiovascular Heart rhythm and rate regular, without murmur or gallop. No signs of CHF. Pedal pulses on the left are easily palpable.. Patient has very prominent venules in the medial ankles. Nonpitting edema and hemosiderin staining of  both legs.. Neurological 10 out of 10 on the monofilament. Psychiatric No evidence of depression, anxiety, or agitation. Calm, cooperative, and communicative. Appropriate interactions and affect.. Notes Wound exam; the area of questions on the left anterior mid tibia. Dime sized wound with some hyper granulation. I used a single silver nitrate on this to try and bring the granulation down to a level commensurate with her surrounding skin. There is no evidence of infection. No cultures were done no inflammation around the wound Electronic Signature(s) Signed: 09/15/2020 8:18:14 AM By: Linton Ham MD Entered By: Linton Ham on 09/13/2020 78:79:89 Hannah Howard ([id number]) -------------------------------------------------------------------------------- Physician Orders Details Patient Name: Hannah Howard. Date of Service: 09/13/2020 9:45 AM Medical Record Number: [id number] Patient Account Number: [id number] Date of Birth/Sex: 1941/02/12 (79 y.o. F) Treating RN: Cornell Barman Primary Care Provider: Fulton Reek Other Clinician: Referring Provider: Fulton Reek Treating Provider/Extender: Tito Dine in Treatment: 0 Verbal / Phone Orders: No Diagnosis Coding Wound Cleansing Wound #1 Left Lower Leg o Dial antibacterial soap, wash wounds, rinse and pat dry prior to dressing wounds Anesthetic (add to Medication List) Wound #1 Left Lower Leg o Topical Lidocaine 4% cream applied to wound bed prior to debridement (In Clinic Only). Primary Wound Dressing Wound #1 Left Lower Leg o Hydrafera Blue  Ready Transfer Secondary Dressing Wound #1 Left Lower Leg o Boardered Foam Dressing Dressing Change Frequency Wound #1 Left Lower Leg o Change Dressing Monday, Wednesday, Friday Follow-up Appointments Wound #1 Left Lower Leg o Return Appointment in 1 week. Edema Control Wound #1 Left Lower Leg o Elevate legs to the level of the heart and pump ankles as often as possible Additional Orders / Instructions Wound #1 Left Lower Leg o Increase protein intake. Electronic Signature(s) Signed: 09/14/2020 5:56:15 PM By: Gretta Cool, BSN, RN, CWS, Kim RN, BSN Signed: 09/15/2020 8:18:14 AM By: Linton Ham MD Entered By: Gretta Cool, BSN, RN, CWS, Kim on 09/13/2020 31:49:70 Hannah Howard ([id number]) -------------------------------------------------------------------------------- Problem List Details Patient Name: KENNAH, HEHR. Date of Service: 09/13/2020 9:45 AM Medical Record Number: [id number] Patient Account Number: [id number] Date of Birth/Sex: [birth year] (79 y.o. F) Treating RN: Cornell Barman Primary Care Provider: Fulton Reek Other Clinician: Referring Provider: Fulton Reek Treating Provider/Extender: Tito Dine in Treatment: 0 Active Problems ICD-10 Encounter Code Description Active Date MDM Diagnosis S80.812D Abrasion, left lower leg, subsequent encounter 09/13/2020 No Yes L97.821 Non-pressure chronic ulcer of other part of left lower leg limited to 09/13/2020 No Yes breakdown of skin I87.303 Chronic venous hypertension (idiopathic) without complications of 86/76/7209 No Yes bilateral lower extremity Inactive Problems Resolved Problems Electronic Signature(s) Signed: 09/15/2020 8:18:14 AM By: Linton Ham MD Entered By: Linton Ham on 09/13/2020 47:09:62 Hannah Howard ([id number]) -------------------------------------------------------------------------------- Progress Note Details Patient Name: Hannah Howard. Date of Service: 09/13/2020  9:45 AM Medical Record Number: [id number] Patient Account Number: [id number] Date of Birth/Sex: [birth year] (79 y.o. F) Treating RN: Cornell Barman Primary Care Provider: Fulton Reek Other Clinician: Referring Provider: Fulton Reek Treating Provider/Extender: Tito Dine in Treatment: 0 Subjective Chief Complaint Information obtained from Patient 09/13/2020; patient is here for review of a traumatic wound on the left anterior lower leg History of Present Illness (HPI) ADMISSION 09/13/2020 This is a 79 year old woman who arrives with a traumatic wound on the left anterior lower leg. She tells [place] about 5 or 6 weeks ago a Coke can fell out of her fridge and hit her on the anterior leg. She had a fair amount of  bleeding however lately she has been trying to heal this with rubbing alcohol and then more recently Neosporin. She is referred here by her primary physician for review of this. Past medical history includes Parkinson's disease, coming up cataract extraction on 10/21, rash of unknown etiology saw dermatology in July 2021, COPD, anxiety and depression, diffuse pain. She is not a diabetic. She does tell us that she has a lot of swelling in her legs over time her skin in the lower extremities is very tender ABI in our clinic was 1.14 Patient History Information obtained from Patient. Allergies amoxicillin Family History Heart Disease - Mother, Hypertension - Father, Kidney Disease - Father, Stroke - Father, No family history of Cancer, Diabetes, Hereditary Spherocytosis, Lung Disease, Seizures, Thyroid Problems, Tuberculosis. Social History Former smoker, Marital Status - Divorced, Alcohol Use - Never, Drug Use - No History, Caffeine Use - Daily. Medical History Eyes Denies history of Cataracts, Glaucoma, Optic Neuritis Ear/Nose/Mouth/Throat Denies history of Chronic sinus problems/congestion, Middle ear problems Hematologic/Lymphatic Denies history of Anemia,  Hemophilia, Human Immunodeficiency Virus, Lymphedema, Sickle Cell Disease Respiratory Denies history of Aspiration, Asthma, Chronic Obstructive Pulmonary Disease (COPD), Pneumothorax, Sleep Apnea, Tuberculosis Cardiovascular Denies history of Angina, Arrhythmia, Congestive Heart Failure, Coronary Artery Disease, Deep Vein Thrombosis, Hypertension, Hypotension, Myocardial Infarction, Peripheral Arterial Disease, Peripheral Venous Disease, Phlebitis, Vasculitis Gastrointestinal Denies history of Cirrhosis , Colitis, Crohn s, Hepatitis A, Hepatitis B, Hepatitis C Endocrine Denies history of Type I Diabetes, Type II Diabetes Genitourinary Denies history of End Stage Renal Disease Immunological Denies history of Lupus Erythematosus, Raynaud s, Scleroderma Integumentary (Skin) Denies history of History of Burn, History of pressure wounds Musculoskeletal Denies history of Gout, Rheumatoid Arthritis, Osteoarthritis, Osteomyelitis Neurologic Denies history of Dementia, Neuropathy, Quadriplegia, Paraplegia, Seizure Disorder Oncologic Denies history of Received Chemotherapy, Received Radiation Psychiatric Denies history of Anorexia/bulimia, Confinement Anxiety Review of Systems (ROS) Bless, Mariabelen F. ([id number]) Constitutional Symptoms (General Health) Denies complaints or symptoms of Fatigue, Fever, Chills, Marked Weight Change. Eyes Denies complaints or symptoms of Dry Eyes, Vision Changes, Glasses / Contacts. Ear/Nose/Mouth/Throat Denies complaints or symptoms of Difficult clearing ears, Sinusitis. Hematologic/Lymphatic Denies complaints or symptoms of Bleeding / Clotting Disorders, Human Immunodeficiency Virus. Respiratory Denies complaints or symptoms of Chronic or frequent coughs, Shortness of Breath. Cardiovascular Denies complaints or symptoms of Chest pain, LE edema. Gastrointestinal Denies complaints or symptoms of Frequent diarrhea, Nausea, Vomiting. Endocrine Denies  complaints or symptoms of Hepatitis, Thyroid disease, Polydypsia (Excessive Thirst). Genitourinary Denies complaints or symptoms of Kidney failure/ Dialysis, Incontinence/dribbling. Immunological Denies complaints or symptoms of Hives, Itching. Integumentary (Skin) Denies complaints or symptoms of Wounds, Bleeding or bruising tendency, Breakdown, Swelling. Musculoskeletal Denies complaints or symptoms of Muscle Pain, Muscle Weakness. Neurologic Denies complaints or symptoms of Numbness/parasthesias, Focal/Weakness. Psychiatric Denies complaints or symptoms of Anxiety, Claustrophobia. Objective Constitutional Sitting or standing Blood Pressure is within target range for patient.. Pulse regular and within target range for patient.Marland Kitchen Respirations regular, non- labored and within target range.. Temperature is normal and within the target range for the patient.Marland Kitchen appears in no distress. Vitals Time Taken: 9:57 AM, Height: 67 in, Source: Stated, Weight: 200 lbs, Source: Stated, BMI: 31.3, Temperature: 98.2 F, Pulse: 89 bpm, Respiratory Rate: 18 breaths/min, Blood Pressure: 140/68 mmHg. Respiratory Respiratory effort is easy and symmetric bilaterally. Rate is normal at rest and on room air.. Bilateral breath sounds are clear and equal in all lobes with no wheezes, rales or rhonchi.. Cardiovascular Heart rhythm and rate regular, without murmur or gallop. No signs of  CHF. Pedal pulses on the left are easily palpable.. Patient has very prominent venules in the medial ankles. Nonpitting edema and hemosiderin staining of both legs.. Neurological 10 out of 10 on the monofilament. Psychiatric No evidence of depression, anxiety, or agitation. Calm, cooperative, and communicative. Appropriate interactions and affect.. General Notes: Wound exam; the area of questions on the left anterior mid tibia. Dime sized wound with some hyper granulation. I used a single silver nitrate on this to try and bring the  granulation down to a level commensurate with her surrounding skin. There is no evidence of infection. No cultures were done no inflammation around the wound Integumentary (Hair, Skin) Wound #1 status is Open. Original cause of wound was Trauma. The wound is located on the Left Lower Leg. The wound measures 1.5cm length x 1.5cm width x 0.1cm depth; 1.767cm^2 area and 0.177cm^3 volume. There is no tunneling or undermining noted. There is a medium amount of serosanguineous drainage noted. There is medium (34-66%) pink granulation within the wound bed. There is a medium (34-66%) amount of necrotic tissue within the wound bed including Adherent Slough. Assessment Hannah Howard, Hannah Howard ([id number]) Active Problems ICD-10 Abrasion, left lower leg, subsequent encounter Non-pressure chronic ulcer of other part of left lower leg limited to breakdown of skin Chronic venous hypertension (idiopathic) without complications of bilateral lower extremity Procedures Wound #1 Pre-procedure diagnosis of Wound #1 is an Abrasion located on the Left Lower Leg . An CHEM CAUT GRANULATION TISS procedure was performed by Ricard Dillon, MD. Post procedure Diagnosis Wound #1: Same as Pre-Procedure Notes: 1 silver nitrate stick used Plan Wound Cleansing: Wound #1 Left Lower Leg: Dial antibacterial soap, wash wounds, rinse and pat dry prior to dressing wounds Anesthetic (add to Medication List): Wound #1 Left Lower Leg: Topical Lidocaine 4% cream applied to wound bed prior to debridement (In Clinic Only). Primary Wound Dressing: Wound #1 Left Lower Leg: Hydrafera Blue Ready Transfer Secondary Dressing: Wound #1 Left Lower Leg: Boardered Foam Dressing Dressing Change Frequency: Wound #1 Left Lower Leg: Change Dressing Monday, Wednesday, Friday Follow-up Appointments: Wound #1 Left Lower Leg: Return Appointment in 1 week. Edema Control: Wound #1 Left Lower Leg: Elevate legs to the level of the heart and pump  ankles as often as possible Additional Orders / Instructions: Wound #1 Left Lower Leg: Increase protein intake. 1. I am going to use Hydrofera Blue under border foam 2. The patient barely tolerated the compression of ABI testing. I am not sure she will tolerate compression. Nonetheless if this wound deteriorates or does not progress she is going to need 3 layer compression. Hydrofera Blue should help with the hyper granulated surface 3. I explained to the patient that she has chronic venous insufficiency with some degree of lymphedema. This accounts for the swelling in her legs, hemosiderin staining dilated small venules in her feet and ankles. She does not have a wound history nevertheless she will be at risk going forward. I will talk to her again about options for "stockings" when she is healed. 4. We will assess her wound improvement on Hydrofera Blue with border foam. We will see her back in 2 weeks if this does not improve she will definitely need compression. 5. There was no evidence of infection or ischemia I spent 35 minutes in review of this patient's past medical history, face-to-face evaluation and preparation of this record Electronic Signature(s) Signed: 09/15/2020 8:18:14 AM By: Linton Ham MD Entered By: Linton Ham on 09/13/2020 00:17:49 Hannah Howard, Hannah Core. (  [id number]) -------------------------------------------------------------------------------- ROS/PFSH Details Patient Name: Hannah Howard, Hannah Howard. Date of Service: 09/13/2020 9:45 AM Medical Record Number: [id number] Patient Account Number: [id number] Date of Birth/Sex: [birth year] (79 y.o. F) Treating RN: Carlene Coria Primary Care Provider: Fulton Reek Other Clinician: Referring Provider: Fulton Reek Treating Provider/Extender: Tito Dine in Treatment: 0 Information Obtained From Patient Constitutional Symptoms (General Health) Complaints and Symptoms: Negative for: Fatigue; Fever; Chills; Marked  Weight Change Eyes Complaints and Symptoms: Negative for: Dry Eyes; Vision Changes; Glasses / Contacts Medical History: Negative for: Cataracts; Glaucoma; Optic Neuritis Ear/Nose/Mouth/Throat Complaints and Symptoms: Negative for: Difficult clearing ears; Sinusitis Medical History: Negative for: Chronic sinus problems/congestion; Middle ear problems Hematologic/Lymphatic Complaints and Symptoms: Negative for: Bleeding / Clotting Disorders; Human Immunodeficiency Virus Medical History: Negative for: Anemia; Hemophilia; Human Immunodeficiency Virus; Lymphedema; Sickle Cell Disease Respiratory Complaints and Symptoms: Negative for: Chronic or frequent coughs; Shortness of Breath Medical History: Negative for: Aspiration; Asthma; Chronic Obstructive Pulmonary Disease (COPD); Pneumothorax; Sleep Apnea; Tuberculosis Cardiovascular Complaints and Symptoms: Negative for: Chest pain; LE edema Medical History: Negative for: Angina; Arrhythmia; Congestive Heart Failure; Coronary Artery Disease; Deep Vein Thrombosis; Hypertension; Hypotension; Myocardial Infarction; Peripheral Arterial Disease; Peripheral Venous Disease; Phlebitis; Vasculitis Gastrointestinal Complaints and Symptoms: Negative for: Frequent diarrhea; Nausea; Vomiting Medical History: Negative for: Cirrhosis ; Colitis; Crohnos; Hepatitis A; Hepatitis B; Hepatitis C Endocrine Complaints and Symptoms: Negative for: Hepatitis; Thyroid disease; Polydypsia (Excessive Thirst) Hannah Howard, Hannah Howard ([id number]) Medical History: Negative for: Type I Diabetes; Type II Diabetes Genitourinary Complaints and Symptoms: Negative for: Kidney failure/ Dialysis; Incontinence/dribbling Medical History: Negative for: End Stage Renal Disease Immunological Complaints and Symptoms: Negative for: Hives; Itching Medical History: Negative for: Lupus Erythematosus; Raynaudos; Scleroderma Integumentary (Skin) Complaints and Symptoms: Negative  for: Wounds; Bleeding or bruising tendency; Breakdown; Swelling Medical History: Negative for: History of Burn; History of pressure wounds Musculoskeletal Complaints and Symptoms: Negative for: Muscle Pain; Muscle Weakness Medical History: Negative for: Gout; Rheumatoid Arthritis; Osteoarthritis; Osteomyelitis Neurologic Complaints and Symptoms: Negative for: Numbness/parasthesias; Focal/Weakness Medical History: Negative for: Dementia; Neuropathy; Quadriplegia; Paraplegia; Seizure Disorder Psychiatric Complaints and Symptoms: Negative for: Anxiety; Claustrophobia Medical History: Negative for: Anorexia/bulimia; Confinement Anxiety Oncologic Medical History: Negative for: Received Chemotherapy; Received Radiation Immunizations Pneumococcal Vaccine: Received Pneumococcal Vaccination: No Implantable Devices None Family and Social History Cancer: No; Diabetes: No; Heart Disease: Yes - Mother; Hereditary Spherocytosis: No; Hypertension: Yes - Father; Kidney Disease: Yes - Father; Lung Disease: No; Seizures: No; Stroke: Yes - Father; Thyroid Problems: No; Tuberculosis: No; Former smoker; Marital Status - Divorced; Alcohol Use: Never; Drug Use: No History; Caffeine Use: Daily; Financial Concerns: No; Food, Clothing or Shelter Needs: No; Support System Lacking: No; Transportation Concerns: No Hannah Howard, Hannah Howard ([id number]) Electronic Signature(s) Signed: 09/13/2020 4:54:21 PM By: Carlene Coria RN Signed: 09/15/2020 8:18:14 AM By: Linton Ham MD Entered By: Carlene Coria on 09/13/2020 70:96:28 Hannah Howard ([id number]) -------------------------------------------------------------------------------- SuperBill Details Patient Name: Hannah Howard. Date of Service: 09/13/2020 Medical Record Number: [id number] Patient Account Number: [id number] Date of Birth/Sex: 12-07-1940 (79 y.o. F) Treating RN: Cornell Barman Primary Care Provider: Fulton Reek Other Clinician: Referring  Provider: Fulton Reek Treating Provider/Extender: Tito Dine in Treatment: 0 Diagnosis Coding ICD-10 Codes Code Description [phone] Abrasion, left lower leg, subsequent encounter L97.821 Non-pressure chronic ulcer of other part of left lower leg limited to breakdown of skin I87.303 Chronic venous hypertension (idiopathic) without complications of bilateral lower extremity Facility Procedures CPT4 Code: [id number] Description: 99213 - WOUND CARE VISIT-LEV 3 EST PT Modifier: Quantity: 1  CPT4 Code: [id number] Description: 35686 - CHEM CAUT GRANULATION TISS Modifier: Quantity: 1 CPT4 Code: Description: ICD-10 Diagnosis Description L97.821 Non-pressure chronic ulcer of other part of left lower leg limited to brea Modifier: kdown of skin Quantity: Physician Procedures CPT4 Code: [id number] Description: WC PHYS LEVEL 3 o NEW PT Modifier: Quantity: 1 CPT4 Code: Description: ICD-10 Diagnosis Description S80.812D Abrasion, left lower leg, subsequent encounter L97.821 Non-pressure chronic ulcer of other part of left lower leg limited to break I87.303 Chronic venous hypertension (idiopathic) without complications  of bilateral Modifier: down of skin lower extremity Quantity: CPT4 Code: [id number] Description: 20802 - WC PHYS CHEM CAUT GRAN TISSUE Modifier: Quantity: 1 CPT4 Code: Description: ICD-10 Diagnosis Description L97.821 Non-pressure chronic ulcer of other part of left lower leg limited to break Modifier: down of skin Quantity: Electronic Signature(s) Signed: 09/15/2020 8:18:14 AM By: Linton Ham MD Entered By: Linton Ham on 09/13/2020 11:06:45

## 2020-09-15 NOTE — Progress Notes (Signed)
Hannah Howard, Hannah Howard ([id number]) Visit Report for 09/13/2020 Allergy List Details Patient Name: Hannah Howard, Hannah Howard. Date of Service: 09/13/2020 9:45 AM Medical Record Number: [id number] Patient Account Number: [id number] Date of Birth/Sex: 06/20/1941 (79 y.o. F) Treating RN: Carlene Coria Primary Care Caylan Schifano: Fulton Reek Other Clinician: Referring Rayona Sardinha: Fulton Reek Treating Clemence Lengyel/Extender: Ricard Dillon Weeks in Treatment: 0 Allergies Active Allergies amoxicillin Allergy Notes Electronic Signature(s) Signed: 09/13/2020 4:54:21 PM By: Carlene Coria RN Entered By: Carlene Coria on 09/13/2020 59:56:38 Hannah Howard ([id number]) -------------------------------------------------------------------------------- Arrival Information Details Patient Name: Hannah Howard Date of Service: 09/13/2020 9:45 AM Medical Record Number: [id number] Patient Account Number: [id number] Date of Birth/Sex: 1941-03-08 (79 y.o. F) Treating RN: Carlene Coria Primary Care Kayl Stogdill: Fulton Reek Other Clinician: Referring Adelina Collard: Fulton Reek Treating Jmari Pelc/Extender: Tito Dine in Treatment: 0 Visit Information Patient Arrived: Charlyn Minerva Time: 09:53 Accompanied By: daughter Transfer Assistance: None Patient Identification Verified: Yes Secondary Verification Process Completed: Yes Patient Requires Transmission-Based Precautions: No Patient Has Alerts: No Electronic Signature(s) Signed: 09/13/2020 4:54:21 PM By: Carlene Coria RN Entered By: Carlene Coria on 09/13/2020 30:16:01 Hannah Howard ([id number]) -------------------------------------------------------------------------------- Clinic Level of Care Assessment Details Patient Name: Hannah Howard Date of Service: 09/13/2020 9:45 AM Medical Record Number: [id number] Patient Account Number: [id number] Date of Birth/Sex: 1941/08/04 (79 y.o. F) Treating RN: Cornell Barman Primary Care Aubreigh Fuerte: Fulton Reek  Other Clinician: Referring Victorious Cosio: Fulton Reek Treating Jerri Hargadon/Extender: Tito Dine in Treatment: 0 Clinic Level of Care Assessment Items TOOL 1 Quantity Score []  - Use when EandM and Procedure is performed on INITIAL visit 0 ASSESSMENTS - Nursing Assessment / Reassessment X - General Physical Exam (combine w/ comprehensive assessment (listed just below) when performed on new 1 20 pt. evals) X- 1 25 Comprehensive Assessment (HX, ROS, Risk Assessments, Wounds Hx, etc.) ASSESSMENTS - Wound and Skin Assessment / Reassessment []  - Dermatologic / Skin Assessment (not related to wound area) 0 ASSESSMENTS - Ostomy and/or Continence Assessment and Care []  - Incontinence Assessment and Management 0 []  - 0 Ostomy Care Assessment and Management (repouching, etc.) PROCESS - Coordination of Care X - Simple Patient / Family Education for ongoing care 1 15 []  - 0 Complex (extensive) Patient / Family Education for ongoing care X- 1 10 Staff obtains Programmer, systems, Records, Test Results / Process Orders []  - 0 Staff telephones HHA, Nursing Homes / Clarify orders / etc []  - 0 Routine Transfer to another Facility (non-emergent condition) []  - 0 Routine Hospital Admission (non-emergent condition) X- 1 15 New Admissions / Biomedical engineer / Ordering NPWT, Apligraf, etc. []  - 0 Emergency Hospital Admission (emergent condition) PROCESS - Special Needs []  - Pediatric / Minor Patient Management 0 []  - 0 Isolation Patient Management []  - 0 Hearing / Language / Visual special needs []  - 0 Assessment of Community assistance (transportation, D/C planning, etc.) []  - 0 Additional assistance / Altered mentation []  - 0 Support Surface(s) Assessment (bed, cushion, seat, etc.) INTERVENTIONS - Miscellaneous []  - External ear exam 0 []  - 0 Patient Transfer (multiple staff / Civil Service fast streamer / Similar devices) []  - 0 Simple Staple / Suture removal (25 or less) []  - 0 Complex Staple  / Suture removal (26 or more) []  - 0 Hypo/Hyperglycemic Management (do not check if billed separately) X- 1 15 Ankle / Brachial Index (ABI) - do not check if billed separately Has the patient been seen at the hospital within the last three years: Yes Total Score: 100 Level Of Care:  New/Established - Level 3 Hannah Howard, Hannah Howard ([id number]) Electronic Signature(s) Signed: 09/14/2020 5:56:15 PM By: Gretta Cool, BSN, RN, CWS, Kim RN, BSN Entered By: Gretta Cool, BSN, RN, CWS, Kim on 09/13/2020 64:33:29 Hannah Howard ([id number]) -------------------------------------------------------------------------------- Encounter Discharge Information Details Patient Name: Hannah Howard. Date of Service: 09/13/2020 9:45 AM Medical Record Number: [id number] Patient Account Number: [id number] Date of Birth/Sex: November 22, 1941 (79 y.o. F) Treating RN: Cornell Barman Primary Care Annamaria Salah: Fulton Reek Other Clinician: Referring Leni Pankonin: Fulton Reek Treating Saim Almanza/Extender: Tito Dine in Treatment: 0 Encounter Discharge Information Items Discharge Condition: Stable Ambulatory Status: Ambulatory Discharge Destination: Home Transportation: Private Auto Accompanied By: daughter Schedule Follow-up Appointment: Yes Clinical Summary of Care: Electronic Signature(s) Signed: 09/14/2020 5:56:15 PM By: Gretta Cool, BSN, RN, CWS, Kim RN, BSN Entered By: Gretta Cool, BSN, RN, CWS, Kim on 09/13/2020 32:35:57 Hannah Howard ([id number]) -------------------------------------------------------------------------------- Lower Extremity Assessment Details Patient Name: Hannah Howard. Date of Service: 09/13/2020 9:45 AM Medical Record Number: [id number] Patient Account Number: [id number] Date of Birth/Sex: July 11, 1941 (79 y.o. F) Treating RN: Carlene Coria Primary Care Loyd Salvador: Fulton Reek Other Clinician: Referring Dymond Spreen: Fulton Reek Treating Khamron Gellert/Extender: Tito Dine in Treatment:  0 Edema Assessment Assessed: [Left: No] [Right: No] Edema: [Left: Ye] [Right: s] Calf Left: Right: Point of Measurement: 36 cm From Medial Instep 39 cm Ankle Left: Right: Point of Measurement: 9 cm From Medial Instep 30 cm Vascular Assessment Blood Pressure: Brachial: [Left:140] Ankle: [Left:Dorsalis Pedis: 160 1.14] Electronic Signature(s) Signed: 09/13/2020 4:54:21 PM By: Carlene Coria RN Entered By: Carlene Coria on 09/13/2020 15:17:61 Hannah Howard ([id number]) -------------------------------------------------------------------------------- Multi Wound Chart Details Patient Name: Hannah Howard. Date of Service: 09/13/2020 9:45 AM Medical Record Number: [id number] Patient Account Number: [id number] Date of Birth/Sex: 1941-01-01 (79 y.o. F) Treating RN: Cornell Barman Primary Care Jahir Halt: Fulton Reek Other Clinician: Referring Vonceil Upshur: Fulton Reek Treating Iola Turri/Extender: Tito Dine in Treatment: 0 Vital Signs Height(in): 67 Pulse(bpm): 89 Weight(lbs): 200 Blood Pressure(mmHg): 140/68 Body Mass Index(BMI): 31 Temperature(F): 98.2 Respiratory Rate(breaths/min): 18 Photos: [N/A:N/A] Wound Location: Left Lower Leg N/A N/A Wounding Event: Trauma N/A N/A Primary Etiology: Abrasion N/A N/A Date Acquired: 08/14/2020 N/A N/A Weeks of Treatment: 0 N/A N/A Wound Status: Open N/A N/A Measurements L x W x D (cm) 1.5x1.5x0.1 N/A N/A Area (cm) : 1.767 N/A N/A Volume (cm) : 0.177 N/A N/A % Reduction in Area: 0.00% N/A N/A % Reduction in Volume: 0.00% N/A N/A Classification: Full Thickness Without Exposed N/A N/A Support Structures Exudate Amount: Medium N/A N/A Exudate Type: Serosanguineous N/A N/A Exudate Color: red, brown N/A N/A Granulation Amount: Medium (34-66%) N/A N/A Granulation Quality: Pink N/A N/A Necrotic Amount: Medium (34-66%) N/A N/A Exposed Structures: Fascia: No N/A N/A Fat Layer (Subcutaneous Tissue): No Tendon: No Muscle:  No Joint: No Bone: No Epithelialization: None N/A N/A Procedures Performed: CHEM CAUT GRANULATION TISS N/A N/A Treatment Notes Wound #1 (Left Lower Leg) Notes Hydrafera blue, bordered foam dressing Electronic Signature(s) Signed: 09/15/2020 8:18:14 AM By: Linton Ham MD Hannah Howard ([id number]) Entered By: Linton Ham on 09/13/2020 82:99:37 Hannah Howard ([id number]) -------------------------------------------------------------------------------- [place] Plan Details Patient Name: Hannah Howard. Date of Service: 09/13/2020 9:45 AM Medical Record Number: [id number] Patient Account Number: [id number] Date of Birth/Sex: 04-24-1941 (79 y.o. F) Treating RN: Cornell Barman Primary Care Katalaya Beel: Fulton Reek Other Clinician: Referring Natalin Bible: Fulton Reek Treating Ladana Chavero/Extender: Tito Dine in Treatment: 0 Active Inactive Orientation to the Wound Care Program Nursing Diagnoses: Knowledge deficit related to the  wound healing center program Goals: Patient/caregiver will verbalize understanding of the [place] Date Initiated: 09/13/2020 Target Resolution Date: 10/14/2020 Goal Status: Active Interventions: Provide education on orientation to the wound center Notes: Pain, Acute or Chronic Nursing Diagnoses: Pain Management - Non-cyclic Acute (Procedural) Potential alteration in comfort, pain Goals: Patient will verbalize adequate pain control and receive pain control interventions during procedures as needed Date Initiated: 09/13/2020 Target Resolution Date: 09/27/2020 Goal Status: Active Interventions: Assess comfort goal upon admission Treatment Activities: Administer pain control measures as ordered : 09/13/2020 Notes: Venous Leg Ulcer Nursing Diagnoses: Actual venous Insuffiency (use after diagnosis is confirmed) Goals: Patient will maintain optimal edema control Date Initiated: 09/13/2020 Target  Resolution Date: 10/14/2020 Goal Status: Active Interventions: Assess peripheral edema status every visit. Treatment Activities: Therapeutic compression applied : 09/13/2020 Notes: Wound/Skin Impairment Nursing Diagnoses: Hannah Howard, Hannah Howard ([id number]) Impaired tissue integrity Goals: Patient/caregiver will verbalize understanding of skin care regimen Date Initiated: 09/13/2020 Target Resolution Date: 09/27/2020 Goal Status: Active Ulcer/skin breakdown will have a volume reduction of 30% by week 4 Date Initiated: 09/13/2020 Target Resolution Date: 10/14/2020 Goal Status: Active Interventions: Provide education on ulcer and skin care Treatment Activities: Skin care regimen initiated : 09/13/2020 Topical wound management initiated : 09/13/2020 Notes: Electronic Signature(s) Signed: 09/14/2020 5:56:15 PM By: Gretta Cool, BSN, RN, CWS, Kim RN, BSN Entered By: Gretta Cool, BSN, RN, CWS, Kim on 09/13/2020 62:95:28 Hannah Howard ([id number]) -------------------------------------------------------------------------------- Pain Assessment Details Patient Name: Hannah Howard. Date of Service: 09/13/2020 9:45 AM Medical Record Number: [id number] Patient Account Number: [id number] Date of Birth/Sex: 1940-12-10 (79 y.o. F) Treating RN: Carlene Coria Primary Care Justine Dines: Fulton Reek Other Clinician: Referring Dyneshia Baccam: Fulton Reek Treating Brieanna Nau/Extender: Tito Dine in Treatment: 0 Active Problems Location of Pain Severity and Description of Pain Patient Has Paino No Site Locations Pain Management and Medication Current Pain Management: Electronic Signature(s) Signed: 09/13/2020 4:54:21 PM By: Carlene Coria RN Entered By: Carlene Coria on 09/13/2020 03:47:42 Hannah Howard ([id number]) -------------------------------------------------------------------------------- Patient/Caregiver Education Details Patient Name: Hannah Howard. Date of Service: 09/13/2020 9:45  AM Medical Record Number: [id number] Patient Account Number: [id number] Date of Birth/Gender: [birth year] (79 y.o. F) Treating RN: Cornell Barman Primary Care Physician: Fulton Reek Other Clinician: Referring Physician: Fulton Reek Treating Physician/Extender: Tito Dine in Treatment: 0 Education Assessment Education Provided To: Patient Education Topics Provided Venous: Handouts: Managing Venous Disease and Related Ulcers Methods: Demonstration, Explain/Verbal Responses: State content correctly Welcome To [facility] [place]: Handouts: Welcome To The [place] Methods: Demonstration, Explain/Verbal Responses: State content correctly Wound/Skin Impairment: Handouts: Caring for Your Ulcer Methods: Demonstration, Explain/Verbal Responses: State content correctly Electronic Signature(s) Signed: 09/14/2020 5:56:15 PM By: Gretta Cool, BSN, RN, CWS, Kim RN, BSN Entered By: Gretta Cool, BSN, RN, CWS, Kim on 09/13/2020 41:66:06 Hannah Howard ([id number]) -------------------------------------------------------------------------------- Wound Assessment Details Patient Name: Hannah Howard. Date of Service: 09/13/2020 9:45 AM Medical Record Number: [id number] Patient Account Number: [id number] Date of Birth/Sex: 26-Feb-1941 (79 y.o. F) Treating RN: Carlene Coria Primary Care Iven Earnhart: Fulton Reek Other Clinician: Referring Elhadj Girton: Fulton Reek Treating Chasitee Zenker/Extender: Tito Dine in Treatment: 0 Wound Status Wound Number: 1 Primary Etiology: Abrasion Wound Location: Left Lower Leg Wound Status: Open Wounding Event: Trauma Date Acquired: 08/14/2020 Weeks Of Treatment: 0 Clustered Wound: No Photos Wound Measurements Length: (cm) 1.5 Width: (cm) 1.5 Depth: (cm) 0.1 Area: (cm) 1.767 Volume: (cm) 0.177 % Reduction in Area: 0% % Reduction in Volume: 0% Epithelialization: None Tunneling: No Undermining: No  Wound  Description Classification: Full Thickness Without Exposed Support Structu Exudate Amount: Medium Exudate Type: Serosanguineous Exudate Color: red, brown res Foul Odor After Cleansing: No Slough/Fibrino Yes Wound Bed Granulation Amount: Medium (34-66%) Exposed Structure Granulation Quality: Pink Fascia Exposed: No Necrotic Amount: Medium (34-66%) Fat Layer (Subcutaneous Tissue) Exposed: No Necrotic Quality: Adherent Slough Tendon Exposed: No Muscle Exposed: No Joint Exposed: No Bone Exposed: No Treatment Notes Wound #1 (Left Lower Leg) Notes Hydrafera blue, bordered foam dressing Electronic Signature(s) Signed: 09/13/2020 4:54:21 PM By: Carlene Coria RN Hannah Howard ([id number]) Entered By: Carlene Coria on 09/13/2020 05:69:79 Hannah Howard ([id number]) -------------------------------------------------------------------------------- Vitals Details Patient Name: Hannah Howard. Date of Service: 09/13/2020 9:45 AM Medical Record Number: [id number] Patient Account Number: [id number] Date of Birth/Sex: 1940/11/30 (79 y.o. F) Treating RN: Carlene Coria Primary Care Lakiyah Arntson: Fulton Reek Other Clinician: Referring Koni Kannan: Fulton Reek Treating Rommie Dunn/Extender: Tito Dine in Treatment: 0 Vital Signs Time Taken: 09:57 Temperature (F): 98.2 Height (in): 67 Pulse (bpm): 89 Source: Stated Respiratory Rate (breaths/min): 18 Weight (lbs): 200 Blood Pressure (mmHg): 140/68 Source: Stated Reference Range: 80 - 120 mg / dl Body Mass Index (BMI): 31.3 Electronic Signature(s) Signed: 09/13/2020 4:54:21 PM By: Carlene Coria RN Entered By: Carlene Coria on 09/13/2020 09:59:28

## 2020-09-16 LAB — SARS CORONAVIRUS 2 (TAT 6-24 HRS): SARS Coronavirus 2: NEGATIVE

## 2020-09-19 ENCOUNTER — Encounter
Admission: RE | Disposition: A | Discharge status: Home / Self Care | Payer: Self-pay | Source: Home / Self Care | Attending: Ophthalmology

## 2020-09-19 ENCOUNTER — Ambulatory Visit: Payer: Medicare Other | Admitting: Anesthesiology

## 2020-09-19 ENCOUNTER — Encounter: Payer: Self-pay | Admitting: Ophthalmology

## 2020-09-19 ENCOUNTER — Ambulatory Visit
Admission: RE | Admit: 2020-09-19 | Discharge: 2020-09-19 | Disposition: A | Discharge status: Home / Self Care | Payer: Medicare Other | Attending: Ophthalmology | Admitting: Ophthalmology

## 2020-09-19 ENCOUNTER — Other Ambulatory Visit: Payer: Self-pay

## 2020-09-19 DIAGNOSIS — H2511 Age-related nuclear cataract, right eye: Secondary | ICD-10-CM | POA: Diagnosis not present

## 2020-09-19 DIAGNOSIS — Z87891 Personal history of nicotine dependence: Secondary | ICD-10-CM | POA: Diagnosis not present

## 2020-09-19 HISTORY — DX: Presence of dental prosthetic device (complete) (partial): Z97.2

## 2020-09-19 HISTORY — PX: CATARACT EXTRACTION W/PHACO: SHX586

## 2020-09-19 HISTORY — DX: Headache, unspecified: R51.9

## 2020-09-19 HISTORY — DX: Parkinson's disease without dyskinesia, without mention of fluctuations: G20.A1

## 2020-09-19 HISTORY — DX: Dizziness and giddiness: R42

## 2020-09-19 HISTORY — DX: Parkinson's disease: G20

## 2020-09-19 HISTORY — DX: Motion sickness, initial encounter: T75.3XXA

## 2020-09-19 HISTORY — DX: Other complications of anesthesia, initial encounter: T88.59XA

## 2020-09-19 SURGERY — PHACOEMULSIFICATION, CATARACT, WITH IOL INSERTION
Anesthesia: Monitor Anesthesia Care | Site: Eye | Laterality: Right

## 2020-09-19 MED ORDER — NA CHONDROIT SULF-NA HYALURON 40-17 MG/ML IO SOLN
INTRAOCULAR | Status: DC | PRN
  Administered 2020-09-19: 1 mL via INTRAOCULAR

## 2020-09-19 MED ORDER — LIDOCAINE HCL (PF) 2 % IJ SOLN
INTRAOCULAR | Status: DC | PRN
  Administered 2020-09-19: 1 mL

## 2020-09-19 MED ORDER — FENTANYL CITRATE (PF) 100 MCG/2ML IJ SOLN
INTRAMUSCULAR | Status: DC | PRN
  Administered 2020-09-19: 50 ug via INTRAVENOUS

## 2020-09-19 MED ORDER — MOXIFLOXACIN HCL 0.5 % OP SOLN
OPHTHALMIC | Status: DC | PRN
  Administered 2020-09-19: 0.2 mL via OPHTHALMIC

## 2020-09-19 MED ORDER — ONDANSETRON HCL 4 MG/2ML IJ SOLN: 4.0000 mg | Freq: Once | INTRAMUSCULAR | Status: DC | PRN

## 2020-09-19 MED ORDER — BRIMONIDINE TARTRATE-TIMOLOL 0.2-0.5 % OP SOLN
OPHTHALMIC | Status: DC | PRN
  Administered 2020-09-19: 1 [drp] via OPHTHALMIC

## 2020-09-19 MED ORDER — TETRACAINE HCL 0.5 % OP SOLN
1.0000 [drp] | OPHTHALMIC | Status: DC | PRN
  Administered 2020-09-19 (×3): 1 [drp] via OPHTHALMIC

## 2020-09-19 MED ORDER — MIDAZOLAM HCL 2 MG/2ML IJ SOLN
INTRAMUSCULAR | Status: DC | PRN
  Administered 2020-09-19: 1 mg via INTRAVENOUS

## 2020-09-19 MED ORDER — ACETAMINOPHEN 10 MG/ML IV SOLN: 1000.0000 mg | Freq: Once | INTRAVENOUS | Status: DC | PRN

## 2020-09-19 MED ORDER — EPINEPHRINE PF 1 MG/ML IJ SOLN
INTRAOCULAR | Status: DC | PRN
  Administered 2020-09-19: 59 mL via OPHTHALMIC

## 2020-09-19 MED ORDER — ARMC OPHTHALMIC DILATING DROPS
1.0000 "application " | OPHTHALMIC | Status: DC | PRN
  Administered 2020-09-19 (×3): 1 via OPHTHALMIC

## 2020-09-19 MED ORDER — LACTATED RINGERS IV SOLN: INTRAVENOUS | Status: DC

## 2020-09-19 SURGICAL SUPPLY — 19 items
CANNULA ANT/CHMB 27GA (MISCELLANEOUS) ×6 IMPLANT
GLOVE SURG LX 8.0 MICRO (GLOVE) ×4
GLOVE SURG LX STRL 8.0 MICRO (GLOVE) ×2 IMPLANT
GLOVE SURG TRIUMPH 8.0 PF LTX (GLOVE) ×3 IMPLANT
GOWN STRL REUS W/ TWL LRG LVL3 (GOWN DISPOSABLE) ×2 IMPLANT
GOWN STRL REUS W/TWL LRG LVL3 (GOWN DISPOSABLE) ×6
LENS IOL TECNIS EYHANCE 23.5 (Intraocular Lens) ×3 IMPLANT
MARKER SKIN DUAL TIP RULER LAB (MISCELLANEOUS) ×3 IMPLANT
NEEDLE FILTER BLUNT 18X 1/2SAF (NEEDLE) ×2
NEEDLE FILTER BLUNT 18X1 1/2 (NEEDLE) ×1 IMPLANT
PACK EYE AFTER SURG (MISCELLANEOUS) ×3 IMPLANT
PACK OPTHALMIC (MISCELLANEOUS) ×3 IMPLANT
PACK PORFILIO (MISCELLANEOUS) ×3 IMPLANT
SUT ETHILON 10-0 CS-B-6CS-B-6 (SUTURE)
SUTURE EHLN 10-0 CS-B-6CS-B-6 (SUTURE) IMPLANT
SYR 3ML LL SCALE MARK (SYRINGE) ×3 IMPLANT
SYR TB 1ML LUER SLIP (SYRINGE) ×3 IMPLANT
WATER STERILE IRR 250ML POUR (IV SOLUTION) ×3 IMPLANT
WIPE NON LINTING 3.25X3.25 (MISCELLANEOUS) ×3 IMPLANT

## 2020-09-19 NOTE — Transfer of Care (Signed)
Immediate Anesthesia Transfer of Care Note  Patient: Hannah Howard  Procedure(s) Performed: CATARACT EXTRACTION PHACO AND INTRAOCULAR LENS PLACEMENT (IOC) RIGHT (Right Eye)  Patient Location: PACU  Anesthesia Type: MAC  Level of Consciousness: awake, alert  and patient cooperative  Airway and Oxygen Therapy: Patient Spontanous Breathing and Patient connected to supplemental oxygen  Post-op Assessment: Post-op Vital signs reviewed, Patient's Cardiovascular Status Stable, Respiratory Function Stable, Patent Airway and No signs of Nausea or vomiting  Post-op Vital Signs: Reviewed and stable  Complications: No complications documented.

## 2020-09-19 NOTE — Op Note (Signed)
PREOPERATIVE DIAGNOSIS:  Nuclear sclerotic cataract of the right eye.   POSTOPERATIVE DIAGNOSIS:  Cataract   OPERATIVE PROCEDURE:@   SURGEON:  Birder Robson, MD.   ANESTHESIA:  Anesthesiologist: Heniser, Fredric Dine, MD CRNA: Cameron Ali, CRNA  1.      Managed anesthesia care. 2.      0.11ml of Shugarcaine was instilled in the eye following the paracentesis.   COMPLICATIONS:  None.   TECHNIQUE:   Stop and chop   DESCRIPTION OF PROCEDURE:  The patient was examined and consented in the preoperative holding area where the aforementioned topical anesthesia was applied to the right eye and then brought back to the Operating Room where the right eye was prepped and draped in the usual sterile ophthalmic fashion and a lid speculum was placed. A paracentesis was created with the side port blade and the anterior chamber was filled with viscoelastic. A near clear corneal incision was performed with the steel keratome. A continuous curvilinear capsulorrhexis was performed with a cystotome followed by the capsulorrhexis forceps. Hydrodissection and hydrodelineation were carried out with BSS on a blunt cannula. The lens was removed in a stop and chop  technique and the remaining cortical material was removed with the irrigation-aspiration handpiece. The capsular bag was inflated with viscoelastic and the Technis [id number]  lens was placed in the capsular bag without complication. The remaining viscoelastic was removed from the eye with the irrigation-aspiration handpiece. The wounds were hydrated. The anterior chamber was flushed with BSS and the eye was inflated to physiologic pressure. 0.22ml of Vigamox was placed in the anterior chamber. The wounds were found to be water tight. The eye was dressed with Combigan. The patient was given protective glasses to wear throughout the day and a shield with which to sleep tonight. The patient was also given drops with which to begin a drop regimen today and will follow-up  with me in one day. Implant Name Type Inv. Item Serial No. Manufacturer Lot No. LRB No. Used Action  LENS IOL TECNIS EYHANCE 23.5 - [id number] Intraocular Lens LENS IOL TECNIS EYHANCE 23.5 [id number] JOHNSON   Right 1 Implanted   Procedure(s) with comments: CATARACT EXTRACTION PHACO AND INTRAOCULAR LENS PLACEMENT (IOC) RIGHT (Right) - 13.20 1:12.3  Electronically signed: Birder Robson 09/19/2020 11:59 AM

## 2020-09-19 NOTE — Anesthesia Procedure Notes (Signed)
Procedure Name: MAC Date/Time: 09/19/2020 11:38 AM Performed by: Cameron Ali, CRNA Pre-anesthesia Checklist: Patient identified, Emergency Drugs available, Suction available, Timeout performed and Patient being monitored Patient Re-evaluated:Patient Re-evaluated prior to induction Oxygen Delivery Method: Nasal cannula Placement Confirmation: positive ETCO2

## 2020-09-19 NOTE — Anesthesia Postprocedure Evaluation (Signed)
Anesthesia Post Note  Patient: Hannah Howard  Procedure(s) Performed: CATARACT EXTRACTION PHACO AND INTRAOCULAR LENS PLACEMENT (IOC) RIGHT (Right Eye)     Patient location during evaluation: PACU Anesthesia Type: MAC Level of consciousness: awake and alert Pain management: pain level controlled Vital Signs Assessment: post-procedure vital signs reviewed and stable Respiratory status: spontaneous breathing, nonlabored ventilation, respiratory function stable and patient connected to nasal cannula oxygen Cardiovascular status: stable and blood pressure returned to baseline Postop Assessment: no apparent nausea or vomiting Anesthetic complications: no   No complications documented.  Janika Jedlicka A  Tangee Marszalek

## 2020-09-19 NOTE — H&P (Signed)
[facility]   Primary Care Physician:  Idelle Crouch, MD Ophthalmologist: Dr. George Ina  Pre-Procedure History & Physical: HPI:  Hannah Howard is a 79 y.o. female here for cataract surgery.   Past Medical History:  Diagnosis Date  . Anxiety   . Arthritis   . Complication of anesthesia    woke during on of her knee operations  . Depression   . GERD (gastroesophageal reflux disease)   . Headache    23-3x/month  . Hyperlipidemia   . Motion sickness    cars  . Parkinson's disease ([place])   . Parkinsonism ([place])   . Vertigo    1 episode, recently  . Wears dentures    partial upper    Past Surgical History:  Procedure Laterality Date  . BREAST BIOPSY Left 1973   neg  . COLONOSCOPY WITH PROPOFOL N/A 04/07/2020   Procedure: COLONOSCOPY WITH PROPOFOL;  Surgeon: Robert Bellow, MD;  Location: [facility] ENDOSCOPY;  Service: Endoscopy;  Laterality: N/A;  . KNEE ARTHROSCOPY Right 2010  . KNEE ARTHROSCOPY Left 2012    Prior to Admission medications   Medication Sig Start Date End Date Taking? Authorizing Provider  aspirin EC 81 MG tablet Take by mouth.   Yes [provider]  carbidopa-levodopa (SINEMET CR) 50-200 MG tablet Take 1 tablet by mouth at bedtime. 12/02/19  Yes [provider]  carbidopa-levodopa (SINEMET IR) 25-250 MG tablet Take 1 tablet by mouth 3 (three) times daily. 12/02/19  Yes [provider]  celecoxib (CELEBREX) 200 MG capsule TAKE 1 CAPSULE BY MOUTH EVERY DAY 05/29/19  Yes [provider]  clobetasol cream (TEMOVATE) 0.05 % APPLY TOPICALLY TWICE DAILY AS DIRECTED 08/19/19  Yes [provider]  doxepin (SINEQUAN) 25 MG capsule Take 25 mg by mouth at bedtime.   Yes [provider]  folic acid (FOLVITE) 1 MG tablet Take 1 mg by mouth daily.   Yes [provider]  gabapentin (NEURONTIN) 100 MG capsule Take 100 mg by mouth daily.   Yes [provider]  hydrOXYzine (ATARAX/VISTARIL) 50 MG tablet  TAKE ONE TABLET 3 TIMES DAILY AS NEEDED FOR ITCHING 12/23/14  Yes [provider]  lansoprazole (PREVACID) 30 MG capsule TAKE 1 CAPSULE BY MOUTH EVERY DAY 05/29/19  Yes [provider]  levocetirizine (XYZAL) 5 MG tablet Take 5 mg by mouth at bedtime. 12/02/19  Yes [provider]  meclizine (ANTIVERT) 25 MG tablet Take 25 mg by mouth 3 (three) times daily as needed for dizziness.   Yes [provider]  montelukast (SINGULAIR) 10 MG tablet TAKE ONE TABLET EVERY DAY 02/29/20  Yes Ralene Bathe, MD  Multiple Vitamin (MULTIVITAMIN) capsule Take by mouth.   Yes [provider]  Polyethylene Glycol 3350 (MIRALAX PO) Take by mouth as needed.   Yes [provider]  sertraline (ZOLOFT) 100 MG tablet TAKE ONE TABLET BY MOUTH EVERY DAY 04/30/19  Yes [provider]  solifenacin (VESICARE) 10 MG tablet Take 10 mg by mouth daily. 12/02/19  Yes [provider]  traZODone (DESYREL) 150 MG tablet TAKE ONE TABLET BY MOUTH AT BEDTIME 05/29/19  Yes [provider]  triamcinolone cream (KENALOG) 0.1 % Apply topically 2 (two) times daily. Avoid face, groin, underarms. 02/15/20  Yes Ralene Bathe, MD  lovastatin (MEVACOR) 40 MG tablet TAKE 1 TABLET BY MOUTH NIGHTLY 05/29/19 12/09/19  [provider]    Allergies as of 08/17/2020 - Review Complete 04/07/2020  Allergen Reaction Noted  .  Amoxicillin Rash 07/06/2019  . Amoxicillin-pot clavulanate Rash 07/06/2019  . Bronopol Rash 02/15/2020    Family History  Problem Relation Age of Onset  . Hematuria Other   . Kidney failure Other   . Prostate cancer Other   . Breast cancer Neg Hx     Social History   Socioeconomic History  . Marital status: Divorced    Spouse name: Not on file  . Number of children: Not on file  . Years of education: Not on file  . Highest education level: Not on file  Occupational History  . Not on file  Tobacco Use  . Smoking status: Former Smoker     Types: Cigarettes    Quit date: 07/02/2000    Years since quitting: 20.2  . Smokeless tobacco: Never Used  Vaping Use  . Vaping Use: Never used  Substance and Sexual Activity  . Alcohol use: No    Alcohol/week: 0.0 standard drinks  . Drug use: No  . Sexual activity: Not on file  Other Topics Concern  . Not on file  Social History Narrative  . Not on file   Social Determinants of Health   Financial Resource Strain:   . Difficulty of Paying Living Expenses: Not on file  Food Insecurity:   . Worried About Charity fundraiser in the Last Year: Not on file  . Ran Out of Food in the Last Year: Not on file  Transportation Needs:   . Lack of Transportation (Medical): Not on file  . Lack of Transportation (Non-Medical): Not on file  Physical Activity:   . Days of Exercise per Week: Not on file  . Minutes of Exercise per Session: Not on file  Stress:   . Feeling of Stress : Not on file  Social Connections:   . Frequency of Communication with Friends and Family: Not on file  . Frequency of Social Gatherings with Friends and Family: Not on file  . Attends Religious Services: Not on file  . Active Member of Clubs or Organizations: Not on file  . Attends Archivist Meetings: Not on file  . Marital Status: Not on file  Intimate Partner Violence:   . Fear of Current or Ex-Partner: Not on file  . Emotionally Abused: Not on file  . Physically Abused: Not on file  . Sexually Abused: Not on file    Review of Systems: See HPI, otherwise negative ROS  Physical Exam: BP (!) 157/71   Pulse 90   Temp 97.7 F (36.5 C) (Temporal)   Ht 5\' 6"  (1.676 m)   Wt 96.6 kg   LMP  (LMP Unknown)   SpO2 97%   BMI 34.38 kg/m  General:   Alert,  pleasant and cooperative in NAD Head:  Normocephalic and atraumatic. Lungs:  Clear to auscultation.    Heart:  Regular rate and rhythm.   Impression/Plan: Hannah Howard is here for cataract surgery.  Risks, benefits, limitations, and  alternatives regarding cataract surgery have been reviewed with the patient.  Questions have been answered.  All parties agreeable.   Birder Robson, MD  09/19/2020, 11:27 AM

## 2020-09-20 ENCOUNTER — Encounter: Payer: Self-pay | Admitting: Ophthalmology

## 2020-09-27 ENCOUNTER — Encounter: Payer: Medicare Other | Attending: Internal Medicine | Admitting: Internal Medicine

## 2020-09-27 ENCOUNTER — Other Ambulatory Visit: Payer: Self-pay

## 2020-09-27 DIAGNOSIS — Z87891 Personal history of nicotine dependence: Secondary | ICD-10-CM | POA: Diagnosis not present

## 2020-09-27 DIAGNOSIS — Z880 Allergy status to penicillin: Secondary | ICD-10-CM | POA: Insufficient documentation

## 2020-09-27 DIAGNOSIS — S81802A Unspecified open wound, left lower leg, initial encounter: Secondary | ICD-10-CM | POA: Insufficient documentation

## 2020-09-27 NOTE — Progress Notes (Signed)
ALEXIS, REBER ([id number]) Visit Report for 09/27/2020 Arrival Information Details Patient Name: Hannah Howard, Hannah Howard. Date of Service: 09/27/2020 11:00 AM Medical Record Number: [id number] Patient Account Number: [id number] Date of Birth/Sex: [birth year] (79 y.o. F) Treating RN: Dolan Amen Primary Care Myrna Vonseggern: Fulton Reek Other Clinician: Referring Lajuanna Pompa: Fulton Reek Treating Nivia Gervase/Extender: Tito Dine in Treatment: 2 Visit Information History Since Last Visit Pain Present Now: No Patient Arrived: Walker Arrival Time: 11:08 Accompanied By: daughter Transfer Assistance: None Patient Identification Verified: Yes Secondary Verification Process Completed: Yes Patient Requires Transmission-Based Precautions: No Patient Has Alerts: No Electronic Signature(s) Signed: 09/27/2020 11:43:42 AM By: Georges Mouse, Minus Breeding Entered By: Georges Mouse, Minus Breeding on 09/27/2020 08:65:78 Hannah Howard ([id number]) -------------------------------------------------------------------------------- Clinic Level of Care Assessment Details Patient Name: Hannah Howard Date of Service: 09/27/2020 11:00 AM Medical Record Number: [id number] Patient Account Number: [id number] Date of Birth/Sex: [birth year] (79 y.o. F) Treating RN: Cornell Barman Primary Care Lyllie Cobbins: Fulton Reek Other Clinician: Referring Kathelene Rumberger: Fulton Reek Treating Alphus Zeck/Extender: Tito Dine in Treatment: 2 Clinic Level of Care Assessment Items TOOL 4 Quantity Score []  - Use when only an EandM is performed on FOLLOW-UP visit 0 ASSESSMENTS - Nursing Assessment / Reassessment X - Reassessment of Co-morbidities (includes updates in patient status) 1 10 X- 1 5 Reassessment of Adherence to Treatment Plan ASSESSMENTS - Wound and Skin Assessment / Reassessment X - Simple Wound Assessment / Reassessment - one wound 1 5 []  - 0 Complex Wound Assessment / Reassessment - multiple wounds []  -  0 Dermatologic / Skin Assessment (not related to wound area) ASSESSMENTS - Focused Assessment []  - Circumferential Edema Measurements - multi extremities 0 []  - 0 Nutritional Assessment / Counseling / Intervention []  - 0 Lower Extremity Assessment (monofilament, tuning fork, pulses) []  - 0 Peripheral Arterial Disease Assessment (using hand held doppler) ASSESSMENTS - Ostomy and/or Continence Assessment and Care []  - Incontinence Assessment and Management 0 []  - 0 Ostomy Care Assessment and Management (repouching, etc.) PROCESS - Coordination of Care X - Simple Patient / Family Education for ongoing care 1 15 []  - 0 Complex (extensive) Patient / Family Education for ongoing care X- 1 10 Staff obtains Programmer, systems, Records, Test Results / Process Orders []  - 0 Staff telephones HHA, Nursing Homes / Clarify orders / etc []  - 0 Routine Transfer to another Facility (non-emergent condition) []  - 0 Routine Hospital Admission (non-emergent condition) []  - 0 New Admissions / Biomedical engineer / Ordering NPWT, Apligraf, etc. []  - 0 Emergency Hospital Admission (emergent condition) X- 1 10 Simple Discharge Coordination []  - 0 Complex (extensive) Discharge Coordination PROCESS - Special Needs []  - Pediatric / Minor Patient Management 0 []  - 0 Isolation Patient Management []  - 0 Hearing / Language / Visual special needs []  - 0 Assessment of Community assistance (transportation, D/C planning, etc.) []  - 0 Additional assistance / Altered mentation []  - 0 Support Surface(s) Assessment (bed, cushion, seat, etc.) INTERVENTIONS - Wound Cleansing / Measurement Degrazia, Jaclynn F. ([id number]) X- 1 5 Simple Wound Cleansing - one wound []  - 0 Complex Wound Cleansing - multiple wounds X- 1 5 Wound Imaging (photographs - any number of wounds) []  - 0 Wound Tracing (instead of photographs) X- 1 5 Simple Wound Measurement - one wound []  - 0 Complex Wound Measurement - multiple  wounds INTERVENTIONS - Wound Dressings []  - Small Wound Dressing one or multiple wounds 0 X- 1 15 Medium Wound Dressing one or multiple wounds []  - 0 Large Wound  Dressing one or multiple wounds []  - 0 Application of Medications - topical []  - 0 Application of Medications - injection INTERVENTIONS - Miscellaneous []  - External ear exam 0 []  - 0 Specimen Collection (cultures, biopsies, blood, body fluids, etc.) []  - 0 Specimen(s) / Culture(s) sent or taken to Lab for analysis []  - 0 Patient Transfer (multiple staff / Civil Service fast streamer / Similar devices) []  - 0 Simple Staple / Suture removal (25 or less) []  - 0 Complex Staple / Suture removal (26 or more) []  - 0 Hypo / Hyperglycemic Management (close monitor of Blood Glucose) []  - 0 Ankle / Brachial Index (ABI) - do not check if billed separately X- 1 5 Vital Signs Has the patient been seen at the hospital within the last three years: Yes Total Score: 90 Level Of Care: New/Established - Level 3 Electronic Signature(s) Signed: 09/27/2020 5:22:48 PM By: Gretta Cool, BSN, RN, CWS, Kim RN, BSN Entered By: Gretta Cool, BSN, RN, CWS, Kim on 09/27/2020 47:82:95 Hannah Howard ([id number]) -------------------------------------------------------------------------------- Encounter Discharge Information Details Patient Name: Hannah Howard. Date of Service: 09/27/2020 11:00 AM Medical Record Number: [id number] Patient Account Number: [id number] Date of Birth/Sex: 11-Dec-1940 (79 y.o. F) Treating RN: Cornell Barman Primary Care Eliannah Hinde: Fulton Reek Other Clinician: Referring Elayna Tobler: Fulton Reek Treating Siniya Lichty/Extender: Tito Dine in Treatment: 2 Encounter Discharge Information Items Discharge Condition: Stable Ambulatory Status: Ambulatory Discharge Destination: Home Transportation: Private Auto Accompanied By: self Schedule Follow-up Appointment: Yes Clinical Summary of Care: Electronic Signature(s) Signed: 09/27/2020  5:22:48 PM By: Gretta Cool, BSN, RN, CWS, Kim RN, BSN Entered By: Gretta Cool, BSN, RN, CWS, Kim on 09/27/2020 84:13:24 Hannah Howard ([id number]) -------------------------------------------------------------------------------- Lower Extremity Assessment Details Patient Name: Hannah Howard. Date of Service: 09/27/2020 11:00 AM Medical Record Number: [id number] Patient Account Number: [id number] Date of Birth/Sex: 10-Oct-1941 (79 y.o. F) Treating RN: Dolan Amen Primary Care Bocephus Cali: Fulton Reek Other Clinician: Referring Lexxi Koslow: Fulton Reek Treating Tahji Innsbrook/Extender: Tito Dine in Treatment: 2 Edema Assessment Assessed: [Left: No] [Right: No] [Left: Edema] [Right: :] Calf Left: Right: Point of Measurement: From Medial Instep 43 cm 43 cm Ankle Left: Right: Point of Measurement: From Medial Instep 31 cm 31 cm Notes 43 cm length Electronic Signature(s) Signed: 09/27/2020 11:43:42 AM By: Charlett Nose Signed: 09/27/2020 5:22:48 PM By: Gretta Cool, BSN, RN, CWS, Kim RN, BSN Entered By: Gretta Cool, BSN, RN, CWS, Kim on 09/27/2020 25:95:63 Hannah Howard ([id number]) -------------------------------------------------------------------------------- Multi Wound Chart Details Patient Name: Hannah Howard. Date of Service: 09/27/2020 11:00 AM Medical Record Number: [id number] Patient Account Number: [id number] Date of Birth/Sex: [birth year] (79 y.o. F) Treating RN: Cornell Barman Primary Care Josmar Messimer: Fulton Reek Other Clinician: Referring Devota Viruet: Fulton Reek Treating Amy Gothard/Extender: Tito Dine in Treatment: 2 Vital Signs Height(in): 67 Pulse(bpm): 33 Weight(lbs): 200 Blood Pressure(mmHg): 147/83 Body Mass Index(BMI): 31 Temperature(F): 97.7 Respiratory Rate(breaths/min): 18 Photos: [N/A:N/A] Wound Location: Left Lower Leg N/A N/A Wounding Event: Trauma N/A N/A Primary Etiology: Abrasion N/A N/A Date Acquired: 08/14/2020 N/A N/A Weeks of  Treatment: 2 N/A N/A Wound Status: Open N/A N/A Measurements L x W x D (cm) 0.3x0.4x0.1 N/A N/A Area (cm) : 0.094 N/A N/A Volume (cm) : 0.009 N/A N/A % Reduction in Area: 94.70% N/A N/A % Reduction in Volume: 94.90% N/A N/A Classification: Partial Thickness N/A N/A Exudate Amount: Small N/A N/A Exudate Type: Serosanguineous N/A N/A Exudate Color: red, brown N/A N/A Granulation Amount: Medium (34-66%) N/A N/A Granulation Quality: Pink N/A N/A Necrotic Amount: None Present (0%)  N/A N/A Exposed Structures: Fascia: No N/A N/A Fat Layer (Subcutaneous Tissue): No Tendon: No Muscle: No Joint: No Bone: No Epithelialization: None N/A N/A Treatment Notes Wound #1 (Left Lower Leg) Notes Hydrafera blue, bordered foam dressing Electronic Signature(s) Signed: 09/27/2020 4:41:22 PM By: Linton Ham MD Entered By: Linton Ham on 09/27/2020 24:09:73 Hannah Howard ([id number]) Hannah Howard ([id number]) -------------------------------------------------------------------------------- [place] Details Patient Name: Hannah Howard. Date of Service: 09/27/2020 11:00 AM Medical Record Number: [id number] Patient Account Number: [id number] Date of Birth/Sex: 1941/03/17 (79 y.o. F) Treating RN: Cornell Barman Primary Care Yasha Tibbett: Fulton Reek Other Clinician: Referring Gennie Eisinger: Fulton Reek Treating Maygen Sirico/Extender: Tito Dine in Treatment: 2 Active Inactive Orientation to the Wound Care Program Nursing Diagnoses: Knowledge deficit related to the wound healing center program Goals: Patient/caregiver will verbalize understanding of the [place] Program Date Initiated: 09/13/2020 Target Resolution Date: 10/14/2020 Goal Status: Active Interventions: Provide education on orientation to the wound center Notes: Pain, Acute or Chronic Nursing Diagnoses: Pain Management - Non-cyclic Acute (Procedural) Potential alteration in  comfort, pain Goals: Patient will verbalize adequate pain control and receive pain control interventions during procedures as needed Date Initiated: 09/13/2020 Target Resolution Date: 09/27/2020 Goal Status: Active Interventions: Assess comfort goal upon admission Treatment Activities: Administer pain control measures as ordered : 09/13/2020 Notes: Venous Leg Ulcer Nursing Diagnoses: Actual venous Insuffiency (use after diagnosis is confirmed) Goals: Patient will maintain optimal edema control Date Initiated: 09/13/2020 Target Resolution Date: 10/14/2020 Goal Status: Active Interventions: Assess peripheral edema status every visit. Treatment Activities: Therapeutic compression applied : 09/13/2020 Notes: Wound/Skin Impairment Nursing Diagnoses: Hannah Howard, Hannah Howard ([id number]) Impaired tissue integrity Goals: Patient/caregiver will verbalize understanding of skin care regimen Date Initiated: 09/13/2020 Target Resolution Date: 09/27/2020 Goal Status: Active Ulcer/skin breakdown will have a volume reduction of 30% by week 4 Date Initiated: 09/13/2020 Target Resolution Date: 10/14/2020 Goal Status: Active Interventions: Provide education on ulcer and skin care Treatment Activities: Skin care regimen initiated : 09/13/2020 Topical wound management initiated : 09/13/2020 Notes: Electronic Signature(s) Signed: 09/27/2020 5:22:48 PM By: Gretta Cool, BSN, RN, CWS, Kim RN, BSN Entered By: Gretta Cool, BSN, RN, CWS, Kim on 09/27/2020 81:85:63 Hannah Howard ([id number]) -------------------------------------------------------------------------------- Pain Assessment Details Patient Name: Hannah Howard. Date of Service: 09/27/2020 11:00 AM Medical Record Number: [id number] Patient Account Number: [id number] Date of Birth/Sex: 11/05/1941 (79 y.o. F) Treating RN: Dolan Amen Primary Care Nikita Surman: Fulton Reek Other Clinician: Referring Taggart Prasad: Fulton Reek Treating  Austynn Pridmore/Extender: Tito Dine in Treatment: 2 Active Problems Location of Pain Severity and Description of Pain Patient Has Paino No Site Locations Rate the pain. Current Pain Level: 0 Pain Management and Medication Current Pain Management: Electronic Signature(s) Signed: 09/27/2020 11:43:42 AM By: Georges Mouse, Minus Breeding Entered By: Georges Mouse, Minus Breeding on 09/27/2020 41:28:78 Hannah Howard ([id number]) -------------------------------------------------------------------------------- Patient/Caregiver Education Details Patient Name: Hannah Howard Date of Service: 09/27/2020 11:00 AM Medical Record Number: [id number] Patient Account Number: [id number] Date of Birth/Gender: 1941-03-24 (79 y.o. F) Treating RN: Cornell Barman Primary Care Physician: Fulton Reek Other Clinician: Referring Physician: Fulton Reek Treating Physician/Extender: Tito Dine in Treatment: 2 Education Assessment Education Provided To: Patient Education Topics Provided Venous: Handouts: Controlling Swelling with Compression Stockings Methods: Demonstration, Explain/Verbal Responses: State content correctly Wound/Skin Impairment: Handouts: Caring for Your Ulcer Methods: Demonstration, Explain/Verbal Responses: State content correctly Electronic Signature(s) Signed: 09/27/2020 5:22:48 PM By: Gretta Cool, BSN, RN, CWS, Kim RN, BSN Entered By: Gretta Cool, BSN, RN, CWS, Kim on 09/27/2020 11:23:25 Hannah Howard,  Hannah Howard ([id number]) -------------------------------------------------------------------------------- Wound Assessment Details Patient Name: Hannah Howard, Hannah Howard. Date of Service: 09/27/2020 11:00 AM Medical Record Number: [id number] Patient Account Number: [id number] Date of Birth/Sex: July 01, 1941 (79 y.o. F) Treating RN: Dolan Amen Primary Care Haneef Hallquist: Fulton Reek Other Clinician: Referring Lanorris Kalisz: Fulton Reek Treating Mysti Haley/Extender: Tito Dine in Treatment:  2 Wound Status Wound Number: 1 Primary Etiology: Abrasion Wound Location: Left Lower Leg Wound Status: Open Wounding Event: Trauma Date Acquired: 08/14/2020 Weeks Of Treatment: 2 Clustered Wound: No Photos Wound Measurements Length: (cm) 0.3 % Re Width: (cm) 0.4 % Re Depth: (cm) 0.1 Epit Area: (cm) 0.094 Tun Volume: (cm) 0.009 Und duction in Area: 94.7% duction in Volume: 94.9% helialization: None neling: No ermining: No Wound Description Classification: Partial Thickness Fou Exudate Amount: Small Slo Exudate Type: Serosanguineous Exudate Color: red, brown l Odor After Cleansing: No ugh/Fibrino Yes Wound Bed Granulation Amount: Medium (34-66%) Exposed Structure Granulation Quality: Pink Fascia Exposed: No Necrotic Amount: None Present (0%) Fat Layer (Subcutaneous Tissue) Exposed: No Tendon Exposed: No Muscle Exposed: No Joint Exposed: No Bone Exposed: No Treatment Notes Wound #1 (Left Lower Leg) Notes Hydrafera blue, bordered foam dressing Electronic Signature(s) Signed: 09/27/2020 11:43:42 AM By: Dionicia Abler, Hannah Howard ([id number]) Entered By: Georges Mouse, Minus Breeding on 09/27/2020 06:00:45 Hannah Howard ([id number]) -------------------------------------------------------------------------------- Vitals Details Patient Name: Hannah Howard. Date of Service: 09/27/2020 11:00 AM Medical Record Number: [id number] Patient Account Number: [id number] Date of Birth/Sex: 1941/05/25 (79 y.o. F) Treating RN: Dolan Amen Primary Care Eddie Payette: Fulton Reek Other Clinician: Referring Tarika Mckethan: Fulton Reek Treating Fredrika Canby/Extender: Tito Dine in Treatment: 2 Vital Signs Time Taken: 11:09 Temperature (F): 97.7 Height (in): 67 Pulse (bpm): 87 Weight (lbs): 200 Respiratory Rate (breaths/min): 18 Body Mass Index (BMI): 31.3 Blood Pressure (mmHg): 147/83 Reference Range: 80 - 120 mg / dl Electronic Signature(s) Signed:  09/27/2020 11:43:42 AM By: Georges Mouse, Minus Breeding Entered By: Georges Mouse, Minus Breeding on 09/27/2020 11:12:10

## 2020-09-27 NOTE — Progress Notes (Signed)
CATALYNA, REILLY ([id number]) Visit Report for 09/27/2020 Debridement Details Patient Name: Hannah Howard, Hannah Howard. Date of Service: 09/27/2020 11:00 AM Medical Record Number: [id number] Patient Account Number: [id number] Date of Birth/Sex: [birth year] (79 y.o. F) Treating RN: Cornell Barman Primary Care Provider: Fulton Reek Other Clinician: Referring Provider: Fulton Reek Treating Provider/Extender: Tito Dine in Treatment: 2 Debridement Performed for Wound #1 Left Lower Leg Assessment: Performed By: Physician Ricard Dillon, MD Debridement Type: Chemical/Enzymatic/Mechanical Agent Used: saline and gauze Level of Consciousness (Pre- Awake and Alert procedure): Pre-procedure Verification/Time Out Yes - 11:33 Taken: Instrument: Other : saline and gauze Bleeding: None Response to Treatment: Procedure was tolerated well Level of Consciousness (Post- Awake and Alert procedure): Post Debridement Measurements of Total Wound Length: (cm) 0.3 Width: (cm) 0.4 Depth: (cm) 0.1 Volume: (cm) 0.009 Character of Wound/Ulcer Post Debridement: Stable Post Procedure Diagnosis Same as Pre-procedure Electronic Signature(s) Signed: 09/27/2020 4:41:22 PM By: Linton Ham MD Signed: 09/27/2020 5:22:48 PM By: Gretta Cool, BSN, RN, CWS, Kim RN, BSN Entered By: Gretta Cool, BSN, RN, CWS, Kim on 09/27/2020 67:89:38 Hannah Howard ([id number]) -------------------------------------------------------------------------------- HPI Details Patient Name: Hannah Howard. Date of Service: 09/27/2020 11:00 AM Medical Record Number: [id number] Patient Account Number: [id number] Date of Birth/Sex: 28-Mar-1941 (79 y.o. F) Treating RN: Cornell Barman Primary Care Provider: Fulton Reek Other Clinician: Referring Provider: Fulton Reek Treating Provider/Extender: Tito Dine in Treatment: 2 History of Present Illness HPI Description: ADMISSION 09/13/2020 This is a 79 year old woman who  arrives with a traumatic wound on the left anterior lower leg. She tells [place] about 5 or 6 weeks ago a Coke can fell out of her fridge and hit her on the anterior leg. She had a fair amount of bleeding however lately she has been trying to heal this with rubbing alcohol and then more recently Neosporin. She is referred here by her primary physician for review of this. Past medical history includes Parkinson's disease, coming up cataract extraction on 10/21, rash of unknown etiology saw dermatology in July 2021, COPD, anxiety and depression, diffuse pain. She is not a diabetic. She does tell us that she has a lot of swelling in her legs over time her skin in the lower extremities is very tender ABI in our clinic was 1.14 11/3; 2-week follow-up. Traumatic wound in the left anterior lower leg in the setting of significant chronic venous insufficiency. She has been using Hydrofera Blue and bordered foam. This is just about closed. Electronic Signature(s) Signed: 09/27/2020 4:41:22 PM By: Linton Ham MD Entered By: Linton Ham on 09/27/2020 35:36:14 Hannah Howard ([id number]) -------------------------------------------------------------------------------- Physical Exam Details Patient Name: Hannah Howard Date of Service: 09/27/2020 11:00 AM Medical Record Number: [id number] Patient Account Number: [id number] Date of Birth/Sex: December 05, 1940 (79 y.o. F) Treating RN: Cornell Barman Primary Care Provider: Fulton Reek Other Clinician: Referring Provider: Fulton Reek Treating Provider/Extender: Tito Dine in Treatment: 2 Constitutional Sitting or standing Blood Pressure is within target range for patient.. Pulse regular and within target range for patient.Marland Kitchen Respirations regular, non- labored and within target range.. Temperature is normal and within the target range for the patient.Marland Kitchen appears in no distress. Cardiovascular Nonpitting edema. Dilated veins in the medial part of  her foot. Notes Wound exam; the area in question is on the left anterior mid tibia. Very small open area remains here. There is no hyper granulation versus last time when I used silver nitrate. There is no evidence of infection. Peripheral pulses are palpable Electronic Signature(s) Signed:  09/27/2020 4:41:22 PM By: Linton Ham MD Entered By: Linton Ham on 09/27/2020 51:02:58 Hannah Howard ([id number]) -------------------------------------------------------------------------------- Physician Orders Details Patient Name: Hannah Howard. Date of Service: 09/27/2020 11:00 AM Medical Record Number: [id number] Patient Account Number: [id number] Date of Birth/Sex: [birth year] (79 y.o. F) Treating RN: Cornell Barman Primary Care Provider: Fulton Reek Other Clinician: Referring Provider: Fulton Reek Treating Provider/Extender: Tito Dine in Treatment: 2 Verbal / Phone Orders: No Diagnosis Coding Wound Cleansing Wound #1 Left Lower Leg o Dial antibacterial soap, wash wounds, rinse and pat dry prior to dressing wounds Anesthetic (add to Medication List) Wound #1 Left Lower Leg o Topical Lidocaine 4% cream applied to wound bed prior to debridement (In Clinic Only). Primary Wound Dressing Wound #1 Left Lower Leg o Hydrafera Blue Ready Transfer Secondary Dressing Wound #1 Left Lower Leg o Boardered Foam Dressing Dressing Change Frequency Wound #1 Left Lower Leg o Change Dressing Monday, Wednesday, Friday Follow-up Appointments Wound #1 Left Lower Leg o Return Appointment in 1 week. Edema Control Wound #1 Left Lower Leg o Elevate legs to the level of the heart and pump ankles as often as possible Additional Orders / Instructions Wound #1 Left Lower Leg o Increase protein intake. Electronic Signature(s) Signed: 09/27/2020 4:41:22 PM By: Linton Ham MD Signed: 09/27/2020 5:22:48 PM By: Gretta Cool, BSN, RN, CWS, Kim RN, BSN Entered By: Gretta Cool, BSN,  RN, CWS, Kim on 09/27/2020 40:08:67 Hannah Howard ([id number]) -------------------------------------------------------------------------------- Problem List Details Patient Name: Hannah Howard. Date of Service: 09/27/2020 11:00 AM Medical Record Number: [id number] Patient Account Number: [id number] Date of Birth/Sex: 02-10-1941 (79 y.o. 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 Date of Service: 09/27/2020 11:00 AM Medical Record Number: [id number] Patient Account Number: [id number] Date of Birth/Sex: [birth year] (79 y.o. F) Treating RN: Cornell Barman Primary Care Provider: Fulton Reek Other Clinician: Referring Provider: Fulton Reek Treating Provider/Extender: Tito Dine in Treatment: 2 Subjective History of Present Illness (HPI) ADMISSION 09/13/2020 This is a 79 year old woman who arrives with a traumatic wound on the left anterior lower leg. She tells [place] about 5 or 6 weeks ago a Coke can fell out of her fridge and hit her on the anterior leg. She had a fair amount of bleeding however lately she has been trying to heal  this with rubbing alcohol and then more recently Neosporin. She is referred here by her primary physician for review of this. Past medical history includes Parkinson's disease, coming up cataract extraction on 10/21, rash of unknown etiology saw dermatology in July 2021, COPD, anxiety and depression, diffuse pain. She is not a diabetic. She does tell us that she has a lot of swelling in her legs over time her skin in the lower extremities is very tender ABI in our clinic was 1.14 11/3; 2-week follow-up. Traumatic wound in the left anterior lower leg in the setting of significant  chronic venous insufficiency. She has been using Hydrofera Blue and bordered foam. This is just about closed. Objective Constitutional Sitting or standing Blood Pressure is within target range for patient.. Pulse regular and within target range for patient.Marland Kitchen Respirations regular, non- labored and within target range.. Temperature is normal and within the target range for the patient.Marland Kitchen appears in no distress. Vitals Time Taken: 11:09 AM, Height: 67 in, Weight: 200 lbs, BMI: 31.3, Temperature: 97.7 F, Pulse: 87 bpm, Respiratory Rate: 18 breaths/min, Blood Pressure: 147/83 mmHg. Cardiovascular Nonpitting edema. Dilated veins in the medial part of her foot. General Notes: Wound exam; the area in question is on the left anterior mid tibia. Very small open area remains here. There is no hyper granulation versus last time when I used silver nitrate. There is no evidence of infection. Peripheral pulses are palpable Integumentary (Hair, Skin) Wound #1 status is Open. Original cause of wound was Trauma. The wound is located on the Left Lower Leg. The wound measures 0.3cm length x 0.4cm width x 0.1cm depth; 0.094cm^2 area and 0.009cm^3 volume. There is no tunneling or undermining noted. There is a small amount of serosanguineous drainage noted. There is medium (34-66%) pink granulation within the wound bed. There is no necrotic  tissue within the wound bed. Assessment Active Problems ICD-10 Abrasion, left lower leg, subsequent encounter Non-pressure chronic ulcer of other part of left lower leg limited to breakdown of skin Chronic venous hypertension (idiopathic) without complications of bilateral lower extremity Hannah Howard, Hannah F. ([id number]) Procedures Wound #1 Pre-procedure diagnosis of Wound #1 is an Abrasion located on the Left Lower Leg . There was a Chemical/Enzymatic/Mechanical debridement performed by Ricard Dillon, MD. With the following instrument(s): saline and gauze. Other agent used was saline and gauze. A time out was conducted at 11:33, prior to the start of the procedure. There was no bleeding. The procedure was tolerated well. Post Debridement Measurements: 0.3cm length x 0.4cm width x 0.1cm depth; 0.009cm^3 volume. Character of Wound/Ulcer Post Debridement is stable. Post procedure Diagnosis Wound #1: Same as Pre-Procedure Plan Wound Cleansing: Wound #1 Left Lower Leg: Dial antibacterial soap, wash wounds, rinse and pat dry prior to dressing wounds Anesthetic (add to Medication List): Wound #1 Left Lower Leg: Topical Lidocaine 4% cream applied to wound bed prior to debridement (In Clinic Only). Primary Wound Dressing: Wound #1 Left Lower Leg: Hydrafera Blue Ready Transfer Secondary Dressing: Wound #1 Left Lower Leg: Boardered Foam Dressing Dressing Change Frequency: Wound #1 Left Lower Leg: Change Dressing Monday, Wednesday, Friday Follow-up Appointments: Wound #1 Left Lower Leg: Return Appointment in 1 week. Edema Control: Wound #1 Left Lower Leg: Elevate legs to the level of the heart and pump ankles as often as possible Additional Orders / Instructions: Wound #1 Left Lower Leg: Increase protein intake. 1. Patient with chronic venous hypertension. We have not been wrapping this that she just did not tolerate it. 2. Fortunately her wound is actually healing on the left were  using Hydrofera Blue with border foam changing every second day 3. We did talk in detail about stockings. She says she cannot get over the toes stockings on. We gave her the option of juxta light or external compression stockings and we did talk about this. If she ultimately agrees to actually use these I will be glad to order them for her next visit at which time she should be healed on this trajectory Electronic Signature(s) Signed: 09/27/2020 4:41:22 PM By: Linton Ham MD Entered By: Linton Ham on 09/27/2020  22:56:72 Hannah Howard, Hannah Howard ([id number]) -------------------------------------------------------------------------------- SuperBill Details Patient Name: Hannah Howard. Date of Service: 09/27/2020 Medical Record Number: [id number] Patient Account Number: [id number] Date of Birth/Sex: 15-Jun-1941 (79 y.o. F) Treating RN: Cornell Barman Primary Care Provider: Fulton Reek Other Clinician: Referring Provider: Fulton Reek Treating Provider/Extender: Tito Dine in Treatment: 2 Diagnosis Coding ICD-10 Codes Code Description [phone] Abrasion, left lower leg, subsequent encounter L97.821 Non-pressure chronic ulcer of other part of left lower leg limited to breakdown of skin I87.303 Chronic venous hypertension (idiopathic) without complications of bilateral lower extremity Facility Procedures CPT4 Code: [id number] Description: 59136 - WOUND CARE VISIT-LEV 3 EST PT Modifier: Quantity: 1 Physician Procedures CPT4 Code: 8599234 Description: 14436 - WC PHYS LEVEL 3 - EST PT Modifier: Quantity: 1 CPT4 Code: Description: ICD-10 Diagnosis Description S80.812D Abrasion, left lower leg, subsequent encounter L97.821 Non-pressure chronic ulcer of other part of left lower leg limited to bre I87.303 Chronic venous hypertension (idiopathic) without complications of  bilater Modifier: akdown of skin al lower extremity Quantity: Electronic Signature(s) Signed: 09/27/2020  4:41:22 PM By: Linton Ham MD Entered By: Linton Ham on 09/27/2020 11:36:13

## 2020-10-02 ENCOUNTER — Encounter: Payer: Self-pay | Admitting: Ophthalmology

## 2020-10-04 ENCOUNTER — Ambulatory Visit: Payer: Medicare Other | Admitting: Internal Medicine

## 2020-10-06 ENCOUNTER — Other Ambulatory Visit: Payer: Self-pay

## 2020-10-06 ENCOUNTER — Other Ambulatory Visit
Admission: RE | Admit: 2020-10-06 | Discharge: 2020-10-06 | Disposition: A | Discharge status: Home / Self Care | Payer: Medicare Other | Source: Ambulatory Visit | Attending: Ophthalmology | Admitting: Ophthalmology

## 2020-10-06 DIAGNOSIS — Z20822 Contact with and (suspected) exposure to covid-19: Secondary | ICD-10-CM | POA: Insufficient documentation

## 2020-10-06 DIAGNOSIS — Z01812 Encounter for preprocedural laboratory examination: Secondary | ICD-10-CM | POA: Insufficient documentation

## 2020-10-07 LAB — SARS CORONAVIRUS 2 (TAT 6-24 HRS): SARS Coronavirus 2: NEGATIVE

## 2020-10-09 NOTE — Discharge Instructions (Signed)

## 2020-10-10 ENCOUNTER — Ambulatory Visit: Payer: Medicare Other | Admitting: Anesthesiology

## 2020-10-10 ENCOUNTER — Encounter: Payer: Self-pay | Admitting: Ophthalmology

## 2020-10-10 ENCOUNTER — Other Ambulatory Visit: Payer: Self-pay

## 2020-10-10 ENCOUNTER — Ambulatory Visit
Admission: RE | Admit: 2020-10-10 | Discharge: 2020-10-10 | Disposition: A | Discharge status: Home / Self Care | Payer: Medicare Other | Attending: Ophthalmology | Admitting: Ophthalmology

## 2020-10-10 ENCOUNTER — Encounter
Admission: RE | Disposition: A | Discharge status: Home / Self Care | Payer: Self-pay | Source: Home / Self Care | Attending: Ophthalmology

## 2020-10-10 DIAGNOSIS — Z79899 Other long term (current) drug therapy: Secondary | ICD-10-CM | POA: Insufficient documentation

## 2020-10-10 DIAGNOSIS — Z880 Allergy status to penicillin: Secondary | ICD-10-CM | POA: Insufficient documentation

## 2020-10-10 DIAGNOSIS — H2512 Age-related nuclear cataract, left eye: Secondary | ICD-10-CM | POA: Diagnosis not present

## 2020-10-10 DIAGNOSIS — Z7982 Long term (current) use of aspirin: Secondary | ICD-10-CM | POA: Insufficient documentation

## 2020-10-10 DIAGNOSIS — Z87891 Personal history of nicotine dependence: Secondary | ICD-10-CM | POA: Diagnosis not present

## 2020-10-10 DIAGNOSIS — Z888 Allergy status to other drugs, medicaments and biological substances status: Secondary | ICD-10-CM | POA: Diagnosis not present

## 2020-10-10 HISTORY — PX: CATARACT EXTRACTION W/PHACO: SHX586

## 2020-10-10 SURGERY — PHACOEMULSIFICATION, CATARACT, WITH IOL INSERTION
Anesthesia: Monitor Anesthesia Care | Site: Eye | Laterality: Left

## 2020-10-10 MED ORDER — EPINEPHRINE PF 1 MG/ML IJ SOLN
INTRAOCULAR | Status: DC | PRN
  Administered 2020-10-10: 63 mL via OPHTHALMIC

## 2020-10-10 MED ORDER — PHENYLEPHRINE HCL 10 % OP SOLN
1.0000 [drp] | OPHTHALMIC | Status: DC | PRN
  Administered 2020-10-10: 1 [drp] via OPHTHALMIC

## 2020-10-10 MED ORDER — ARMC OPHTHALMIC DILATING DROPS
1.0000 "application " | OPHTHALMIC | Status: DC | PRN
  Administered 2020-10-10 (×3): 1 via OPHTHALMIC

## 2020-10-10 MED ORDER — FENTANYL CITRATE (PF) 100 MCG/2ML IJ SOLN
INTRAMUSCULAR | Status: DC | PRN
  Administered 2020-10-10: 50 ug via INTRAVENOUS

## 2020-10-10 MED ORDER — BRIMONIDINE TARTRATE-TIMOLOL 0.2-0.5 % OP SOLN
OPHTHALMIC | Status: DC | PRN
  Administered 2020-10-10: 1 [drp] via OPHTHALMIC

## 2020-10-10 MED ORDER — MOXIFLOXACIN HCL 0.5 % OP SOLN
OPHTHALMIC | Status: DC | PRN
  Administered 2020-10-10: 0.2 mL via OPHTHALMIC

## 2020-10-10 MED ORDER — NA CHONDROIT SULF-NA HYALURON 40-17 MG/ML IO SOLN
INTRAOCULAR | Status: DC | PRN
  Administered 2020-10-10: 1 mL via INTRAOCULAR

## 2020-10-10 MED ORDER — ONDANSETRON HCL 4 MG/2ML IJ SOLN: 4.0000 mg | Freq: Once | INTRAMUSCULAR | Status: DC | PRN

## 2020-10-10 MED ORDER — ACETAMINOPHEN 10 MG/ML IV SOLN: 1000.0000 mg | Freq: Once | INTRAVENOUS | Status: DC | PRN

## 2020-10-10 MED ORDER — LIDOCAINE HCL (PF) 2 % IJ SOLN
INTRAOCULAR | Status: DC | PRN
  Administered 2020-10-10: 2 mL

## 2020-10-10 MED ORDER — MIDAZOLAM HCL 2 MG/2ML IJ SOLN
INTRAMUSCULAR | Status: DC | PRN
  Administered 2020-10-10: 1 mg via INTRAVENOUS

## 2020-10-10 MED ORDER — LACTATED RINGERS IV SOLN: INTRAVENOUS | Status: DC

## 2020-10-10 MED ORDER — TETRACAINE HCL 0.5 % OP SOLN
1.0000 [drp] | OPHTHALMIC | Status: DC | PRN
  Administered 2020-10-10 (×3): 1 [drp] via OPHTHALMIC

## 2020-10-10 SURGICAL SUPPLY — 19 items
CANNULA ANT/CHMB 27G (MISCELLANEOUS) ×2 IMPLANT
CANNULA ANT/CHMB 27GA (MISCELLANEOUS) ×6 IMPLANT
GLOVE SURG LX 8.0 MICRO (GLOVE) ×2
GLOVE SURG LX STRL 8.0 MICRO (GLOVE) ×1 IMPLANT
GLOVE SURG TRIUMPH 8.0 PF LTX (GLOVE) ×3 IMPLANT
GOWN STRL REUS W/ TWL LRG LVL3 (GOWN DISPOSABLE) ×2 IMPLANT
GOWN STRL REUS W/TWL LRG LVL3 (GOWN DISPOSABLE) ×6
LENS IOL TECNIS EYHANCE 24.0 (Intraocular Lens) ×2 IMPLANT
MARKER SKIN DUAL TIP RULER LAB (MISCELLANEOUS) ×3 IMPLANT
NDL FILTER BLUNT 18X1 1/2 (NEEDLE) ×1 IMPLANT
NEEDLE FILTER BLUNT 18X 1/2SAF (NEEDLE) ×2
NEEDLE FILTER BLUNT 18X1 1/2 (NEEDLE) ×1 IMPLANT
PACK EYE AFTER SURG (MISCELLANEOUS) ×3 IMPLANT
PACK OPTHALMIC (MISCELLANEOUS) ×3 IMPLANT
PACK PORFILIO (MISCELLANEOUS) ×3 IMPLANT
SYR 3ML LL SCALE MARK (SYRINGE) ×3 IMPLANT
SYR TB 1ML LUER SLIP (SYRINGE) ×3 IMPLANT
WATER STERILE IRR 250ML POUR (IV SOLUTION) ×3 IMPLANT
WIPE NON LINTING 3.25X3.25 (MISCELLANEOUS) ×3 IMPLANT

## 2020-10-10 NOTE — Anesthesia Postprocedure Evaluation (Signed)
Anesthesia Post Note  Patient: Hannah Howard  Procedure(s) Performed: CATARACT EXTRACTION PHACO AND INTRAOCULAR LENS PLACEMENT (IOC) LEFT 8.86 00:51.5 (Left Eye)     Patient location during evaluation: PACU Anesthesia Type: MAC Level of consciousness: awake and alert Pain management: pain level controlled Vital Signs Assessment: post-procedure vital signs reviewed and stable Respiratory status: spontaneous breathing, nonlabored ventilation, respiratory function stable and patient connected to nasal cannula oxygen Cardiovascular status: stable and blood pressure returned to baseline Postop Assessment: no apparent nausea or vomiting Anesthetic complications: no   No complications documented.  Abbey Veith A  Thad Osoria

## 2020-10-10 NOTE — Op Note (Signed)
PREOPERATIVE DIAGNOSIS:  Nuclear sclerotic cataract of the left eye.   POSTOPERATIVE DIAGNOSIS:  Nuclear sclerotic cataract of the left eye.   OPERATIVE PROCEDURE:@   SURGEON:  Birder Robson, MD.   ANESTHESIA:  Anesthesiologist: Heniser, Fredric Dine, MD CRNA: Silvana Newness, CRNA  1.      Managed anesthesia care. 2.     0.3ml of Shugarcaine was instilled following the paracentesis   COMPLICATIONS:  None.   TECHNIQUE:   Stop and chop   DESCRIPTION OF PROCEDURE:  The patient was examined and consented in the preoperative holding area where the aforementioned topical anesthesia was applied to the left eye and then brought back to the Operating Room where the left eye was prepped and draped in the usual sterile ophthalmic fashion and a lid speculum was placed. A paracentesis was created with the side port blade and the anterior chamber was filled with viscoelastic. A near clear corneal incision was performed with the steel keratome. A continuous curvilinear capsulorrhexis was performed with a cystotome followed by the capsulorrhexis forceps. Hydrodissection and hydrodelineation were carried out with BSS on a blunt cannula. The lens was removed in a stop and chop  technique and the remaining cortical material was removed with the irrigation-aspiration handpiece. The capsular bag was inflated with viscoelastic and the Technis ZCB00 lens was placed in the capsular bag without complication. The remaining viscoelastic was removed from the eye with the irrigation-aspiration handpiece. The wounds were hydrated. The anterior chamber was flushed with BSS and the eye was inflated to physiologic pressure. 0.44ml Vigamox was placed in the anterior chamber. The wounds were found to be water tight. The eye was dressed with Combigan. The patient was given protective glasses to wear throughout the day and a shield with which to sleep tonight. The patient was also given drops with which to begin a drop regimen today and  will follow-up with me in one day. Implant Name Type Inv. Item Serial No. Manufacturer Lot No. LRB No. Used Action  LENS IOL TECNIS EYHANCE 24.0 - [id number] Intraocular Lens LENS IOL TECNIS EYHANCE 24.0 [id number] JOHNSON   Left 1 Implanted    Procedure(s): CATARACT EXTRACTION PHACO AND INTRAOCULAR LENS PLACEMENT (IOC) LEFT 8.86 00:51.5 (Left)  Electronically signed: Birder Robson 10/10/2020 11:38 AM

## 2020-10-10 NOTE — H&P (Signed)
[facility]   Primary Care Physician:  Idelle Crouch, MD Ophthalmologist: Dr. George Ina  Pre-Procedure History & Physical: HPI:  Hannah Howard is a 79 y.o. female here for cataract surgery.   Past Medical History:  Diagnosis Date  . Anxiety   . Arthritis   . Complication of anesthesia    woke during on of her knee operations  . Depression   . GERD (gastroesophageal reflux disease)   . Headache    23-3x/month  . Hyperlipidemia   . Motion sickness    cars  . Parkinson's disease ([place])   . Parkinsonism ([place])   . Vertigo    1 episode, recently  . Wears dentures    partial upper    Past Surgical History:  Procedure Laterality Date  . BREAST BIOPSY Left 1973   neg  . CATARACT EXTRACTION W/PHACO Right 09/19/2020   Procedure: CATARACT EXTRACTION PHACO AND INTRAOCULAR LENS PLACEMENT ([place]) RIGHT;  Surgeon: Birder Robson, MD;  Location: [place];  Service: Ophthalmology;  Laterality: Right;  13.20 1:12.3  . COLONOSCOPY WITH PROPOFOL N/A 04/07/2020   Procedure: COLONOSCOPY WITH PROPOFOL;  Surgeon: Robert Bellow, MD;  Location: [facility] ENDOSCOPY;  Service: Endoscopy;  Laterality: N/A;  . KNEE ARTHROSCOPY Right 2010  . KNEE ARTHROSCOPY Left 2012    Prior to Admission medications   Medication Sig Start Date End Date Taking? Authorizing Provider  aspirin EC 81 MG tablet Take by mouth.   Yes [provider]  carbidopa-levodopa (SINEMET CR) 50-200 MG tablet Take 1 tablet by mouth at bedtime. 12/02/19  Yes [provider]  carbidopa-levodopa (SINEMET IR) 25-250 MG tablet Take 1 tablet by mouth 3 (three) times daily. 12/02/19  Yes [provider]  celecoxib (CELEBREX) 200 MG capsule TAKE 1 CAPSULE BY MOUTH EVERY DAY 05/29/19  Yes [provider]  doxepin (SINEQUAN) 25 MG capsule Take 25 mg by mouth at bedtime.   Yes [provider]  folic acid (FOLVITE) 1 MG tablet Take 1 mg by mouth daily.   Yes [provider]   gabapentin (NEURONTIN) 100 MG capsule Take 100 mg by mouth daily.   Yes [provider]  hydrOXYzine (ATARAX/VISTARIL) 50 MG tablet TAKE ONE TABLET 3 TIMES DAILY AS NEEDED FOR ITCHING 12/23/14  Yes [provider]  lansoprazole (PREVACID) 30 MG capsule TAKE 1 CAPSULE BY MOUTH EVERY DAY 05/29/19  Yes [provider]  levocetirizine (XYZAL) 5 MG tablet Take 5 mg by mouth at bedtime. 12/02/19  Yes [provider]  meclizine (ANTIVERT) 25 MG tablet Take 25 mg by mouth 3 (three) times daily as needed for dizziness.   Yes [provider]  montelukast (SINGULAIR) 10 MG tablet TAKE ONE TABLET EVERY DAY 02/29/20  Yes Ralene Bathe, MD  Multiple Vitamin (MULTIVITAMIN) capsule Take by mouth.   Yes [provider]  Polyethylene Glycol 3350 (MIRALAX PO) Take by mouth as needed.   Yes [provider]  sertraline (ZOLOFT) 100 MG tablet TAKE ONE TABLET BY MOUTH EVERY DAY 04/30/19  Yes [provider]  solifenacin (VESICARE) 10 MG tablet Take 10 mg by mouth daily. 12/02/19  Yes [provider]  traZODone (DESYREL) 150 MG tablet TAKE ONE TABLET BY MOUTH AT BEDTIME 05/29/19  Yes [provider]  triamcinolone cream (KENALOG) 0.1 % Apply topically 2 (two) times daily. Avoid face, groin, underarms. 02/15/20  Yes Ralene Bathe, MD  clobetasol cream (TEMOVATE) 0.05 % APPLY TOPICALLY TWICE DAILY AS DIRECTED 08/19/19  [provider]  lovastatin (MEVACOR) 40 MG tablet TAKE 1 TABLET BY MOUTH NIGHTLY 05/29/19 12/09/19  [provider]    Allergies as of 09/22/2020 - Review Complete 09/19/2020  Allergen Reaction Noted  . Amoxicillin Rash 07/06/2019  . Amoxicillin-pot clavulanate Rash 07/06/2019  . Bronopol Rash 02/15/2020    Family History  Problem Relation Age of Onset  . Hematuria Other   . Kidney failure Other   . Prostate cancer Other   . Breast cancer Neg Hx     Social History   Socioeconomic History  .  Marital status: Divorced    Spouse name: Not on file  . Number of children: Not on file  . Years of education: Not on file  . Highest education level: Not on file  Occupational History  . Not on file  Tobacco Use  . Smoking status: Former Smoker    Types: Cigarettes    Quit date: 07/02/2000    Years since quitting: 20.2  . Smokeless tobacco: Never Used  Vaping Use  . Vaping Use: Never used  Substance and Sexual Activity  . Alcohol use: No    Alcohol/week: 0.0 standard drinks  . Drug use: No  . Sexual activity: Not on file  Other Topics Concern  . Not on file  Social History Narrative  . Not on file   Social Determinants of Health   Financial Resource Strain:   . Difficulty of Paying Living Expenses: Not on file  Food Insecurity:   . Worried About Charity fundraiser in the Last Year: Not on file  . Ran Out of Food in the Last Year: Not on file  Transportation Needs:   . Lack of Transportation (Medical): Not on file  . Lack of Transportation (Non-Medical): Not on file  Physical Activity:   . Days of Exercise per Week: Not on file  . Minutes of Exercise per Session: Not on file  Stress:   . Feeling of Stress : Not on file  Social Connections:   . Frequency of Communication with Friends and Family: Not on file  . Frequency of Social Gatherings with Friends and Family: Not on file  . Attends Religious Services: Not on file  . Active Member of Clubs or Organizations: Not on file  . Attends Archivist Meetings: Not on file  . Marital Status: Not on file  Intimate Partner Violence:   . Fear of Current or Ex-Partner: Not on file  . Emotionally Abused: Not on file  . Physically Abused: Not on file  . Sexually Abused: Not on file    Review of Systems: See HPI, otherwise negative ROS  Physical Exam: LMP  (LMP Unknown)  General:   Alert,  pleasant and cooperative in NAD Head:  Normocephalic and atraumatic. Respiratory:  Normal work of breathing. Heart:   Regular rate and rhythm.  Impression/Plan: Hannah Howard is here for cataract surgery.  Risks, benefits, limitations, and alternatives regarding cataract surgery have been reviewed with the patient.  Questions have been answered.  All parties agreeable.   Birder Robson, MD  10/10/2020, 10:55 AM

## 2020-10-10 NOTE — Anesthesia Procedure Notes (Signed)
Procedure Name: MAC Date/Time: 10/10/2020 11:22 AM Performed by: Silvana Newness, CRNA Pre-anesthesia Checklist: Patient identified, Emergency Drugs available, Suction available, Patient being monitored and Timeout performed Patient Re-evaluated:Patient Re-evaluated prior to induction Oxygen Delivery Method: Nasal cannula Placement Confirmation: positive ETCO2

## 2020-10-10 NOTE — Anesthesia Preprocedure Evaluation (Signed)
Anesthesia Evaluation  Patient identified by MRN, date of birth, ID band Patient awake    Reviewed: Allergy & Precautions, NPO status , Patient's Chart, lab work & pertinent test results, reviewed documented beta blocker date and time   History of Anesthesia Complications Negative for: history of anesthetic complications  Airway Mallampati: III  TM Distance: >3 FB Neck ROM: Limited  Mouth opening: Limited Mouth Opening  Dental  (+) Partial Upper   Pulmonary former smoker,    breath sounds clear to auscultation       Cardiovascular Exercise Tolerance: Poor hypertension, (-) angina+ DOE (Chronic, stable)   Rhythm:Regular Rate:Normal   HLD   Neuro/Psych  Headaches, PSYCHIATRIC DISORDERS Anxiety Depression  Parkinson's    GI/Hepatic GERD  Controlled,  Endo/Other    Renal/GU      Musculoskeletal  (+) Arthritis ,   Abdominal (+) + obese (BMI 34),   Peds  Hematology   Anesthesia Other Findings   Reproductive/Obstetrics                            Anesthesia Physical  Anesthesia Plan  ASA: III  Anesthesia Plan: MAC   Post-op Pain Management:    Induction: Intravenous  PONV Risk Score and Plan: 2 and TIVA, Midazolam and Treatment may vary due to age or medical condition  Airway Management Planned: Nasal Cannula  Additional Equipment:   Intra-op Plan:   Post-operative Plan:   Informed Consent: I have reviewed the patients History and Physical, chart, labs and discussed the procedure including the risks, benefits and alternatives for the proposed anesthesia with the patient or authorized representative who has indicated his/her understanding and acceptance.       Plan Discussed with: CRNA and Anesthesiologist  Anesthesia Plan Comments:         Anesthesia Quick Evaluation

## 2020-10-10 NOTE — Transfer of Care (Signed)
Immediate Anesthesia Transfer of Care Note  Patient: Hannah Howard  Procedure(s) Performed: CATARACT EXTRACTION PHACO AND INTRAOCULAR LENS PLACEMENT (IOC) LEFT 8.86 00:51.5 (Left Eye)  Patient Location: PACU  Anesthesia Type: MAC  Level of Consciousness: awake, alert  and patient cooperative  Airway and Oxygen Therapy: Patient Spontanous Breathing and Patient connected to supplemental oxygen  Post-op Assessment: Post-op Vital signs reviewed, Patient's Cardiovascular Status Stable, Respiratory Function Stable, Patent Airway and No signs of Nausea or vomiting  Post-op Vital Signs: Reviewed and stable  Complications: No complications documented.

## 2020-10-11 ENCOUNTER — Ambulatory Visit: Payer: Medicare Other | Admitting: Internal Medicine

## 2020-10-11 ENCOUNTER — Encounter: Payer: Medicare Other | Admitting: Internal Medicine

## 2020-10-11 ENCOUNTER — Encounter: Payer: Self-pay | Admitting: Ophthalmology

## 2020-10-11 DIAGNOSIS — S81802A Unspecified open wound, left lower leg, initial encounter: Secondary | ICD-10-CM | POA: Diagnosis not present

## 2020-10-17 ENCOUNTER — Ambulatory Visit: Payer: Medicare Other | Admitting: Physician Assistant

## 2020-10-17 ENCOUNTER — Ambulatory Visit: Payer: Medicare Other

## 2020-10-17 NOTE — Progress Notes (Signed)
DALAL, LIVENGOOD ([id number]) Visit Report for 10/11/2020 Arrival Information Details Patient Name: Hannah Howard, Hannah Howard. Date of Service: 10/11/2020 9:00 AM Medical Record Number: [id number] Patient Account Number: [id number] Date of Birth/Sex: 02/04/1941 (79 y.o. F) Treating RN: Cornell Barman Primary Care Galaxy Borden: Fulton Reek Other Clinician: Referring [place]ing Sigmon: Fulton Reek Treating Daivd Fredericksen/Extender: Tito Dine in Treatment: 4 Visit Information History Since Last Visit Pain Present Now: No Patient Arrived: Walker Arrival Time: 09:03 Accompanied By: self Transfer Assistance: None Patient Identification Verified: Yes Secondary Verification Process Completed: Yes Patient Requires Transmission-Based Precautions: No Patient Has Alerts: No Electronic Signature(s) Signed: 10/12/2020 5:12:38 PM By: Gretta Cool, BSN, RN, CWS, Kim RN, BSN Entered By: Gretta Cool, BSN, RN, CWS, Kim on 10/11/2020 30:07:62 Hannah Howard ([id number]) -------------------------------------------------------------------------------- Clinic Level of Care Assessment Details Patient Name: Hannah Howard. Date of Service: 10/11/2020 9:00 AM Medical Record Number: [id number] Patient Account Number: [id number] Date of Birth/Sex: 03-19-1941 (79 y.o. F) Treating RN: Cornell Barman Primary Care Seletha Zimmermann: Fulton Reek Other Clinician: Referring Maylyn Narvaiz: Fulton Reek Treating Coner Gibbard/Extender: Tito Dine in Treatment: 4 Clinic Level of Care Assessment Items TOOL 4 Quantity Score []  - Use when only an EandM is performed on FOLLOW-UP visit 0 ASSESSMENTS - Nursing Assessment / Reassessment X - Reassessment of Co-morbidities (includes updates in patient status) 1 10 X- 1 5 Reassessment of Adherence to Treatment Plan ASSESSMENTS - Wound and Skin Assessment / Reassessment []  - Simple Wound Assessment / Reassessment - one wound 0 X- 3 5 Complex Wound Assessment / Reassessment - multiple wounds []  -  0 Dermatologic / Skin Assessment (not related to wound area) ASSESSMENTS - Focused Assessment []  - Circumferential Edema Measurements - multi extremities 0 []  - 0 Nutritional Assessment / Counseling / Intervention []  - 0 Lower Extremity Assessment (monofilament, tuning fork, pulses) []  - 0 Peripheral Arterial Disease Assessment (using hand held doppler) ASSESSMENTS - Ostomy and/or Continence Assessment and Care []  - Incontinence Assessment and Management 0 []  - 0 Ostomy Care Assessment and Management (repouching, etc.) PROCESS - Coordination of Care X - Simple Patient / Family Education for ongoing care 1 15 []  - 0 Complex (extensive) Patient / Family Education for ongoing care X- 1 10 Staff obtains Programmer, systems, Records, Test Results / Process Orders []  - 0 Staff telephones HHA, Nursing Homes / Clarify orders / etc []  - 0 Routine Transfer to another Facility (non-emergent condition) []  - 0 Routine Hospital Admission (non-emergent condition) []  - 0 New Admissions / Biomedical engineer / Ordering NPWT, Apligraf, etc. []  - 0 Emergency Hospital Admission (emergent condition) X- 1 10 Simple Discharge Coordination []  - 0 Complex (extensive) Discharge Coordination PROCESS - Special Needs []  - Pediatric / Minor Patient Management 0 []  - 0 Isolation Patient Management []  - 0 Hearing / Language / Visual special needs []  - 0 Assessment of Community assistance (transportation, D/C planning, etc.) []  - 0 Additional assistance / Altered mentation []  - 0 Support Surface(s) Assessment (bed, cushion, seat, etc.) INTERVENTIONS - Wound Cleansing / Measurement Baruch, Remington F. ([id number]) []  - 0 Simple Wound Cleansing - one wound X- 3 5 Complex Wound Cleansing - multiple wounds X- 1 5 Wound Imaging (photographs - any number of wounds) []  - 0 Wound Tracing (instead of photographs) []  - 0 Simple Wound Measurement - one wound X- 3 5 Complex Wound Measurement - multiple  wounds INTERVENTIONS - Wound Dressings []  - Small Wound Dressing one or multiple wounds 0 X- 1 15 Medium Wound Dressing one or multiple wounds []  -  0 Large Wound Dressing one or multiple wounds []  - 0 Application of Medications - topical []  - 0 Application of Medications - injection INTERVENTIONS - Miscellaneous []  - External ear exam 0 []  - 0 Specimen Collection (cultures, biopsies, blood, body fluids, etc.) []  - 0 Specimen(s) / Culture(s) sent or taken to Lab for analysis []  - 0 Patient Transfer (multiple staff / Civil Service fast streamer / Similar devices) []  - 0 Simple Staple / Suture removal (25 or less) []  - 0 Complex Staple / Suture removal (26 or more) []  - 0 Hypo / Hyperglycemic Management (close monitor of Blood Glucose) []  - 0 Ankle / Brachial Index (ABI) - do not check if billed separately X- 1 5 Vital Signs Has the patient been seen at the hospital within the last three years: Yes Total Score: 120 Level Of Care: New/Established - Level 4 Electronic Signature(s) Signed: 10/12/2020 5:12:38 PM By: Gretta Cool, BSN, RN, CWS, Kim RN, BSN Entered By: Gretta Cool, BSN, RN, CWS, Kim on 10/11/2020 76:16:07 Hannah Howard ([id number]) -------------------------------------------------------------------------------- Encounter Discharge Information Details Patient Name: Hannah Howard. Date of Service: 10/11/2020 9:00 AM Medical Record Number: [id number] Patient Account Number: [id number] Date of Birth/Sex: 09/09/1941 (79 y.o. F) Treating RN: Cornell Barman Primary Care Quron Ruddy: Fulton Reek Other Clinician: Referring Rannie Craney: Fulton Reek Treating Lizzie Cokley/Extender: Tito Dine in Treatment: 4 Encounter Discharge Information Items Discharge Condition: Stable Ambulatory Status: Walker Discharge Destination: Home Transportation: Private Auto Accompanied By: self Schedule Follow-up Appointment: Yes Clinical Summary of Care: Electronic Signature(s) Signed: 10/12/2020  5:12:38 PM By: Gretta Cool, BSN, RN, CWS, Kim RN, BSN Entered By: Gretta Cool, BSN, RN, CWS, Kim on 10/11/2020 00:93:81 Hannah Howard ([id number]) -------------------------------------------------------------------------------- Lower Extremity Assessment Details Patient Name: Hannah Howard. Date of Service: 10/11/2020 9:00 AM Medical Record Number: [id number] Patient Account Number: [id number] Date of Birth/Sex: 08/24/1941 (79 y.o. F) Treating RN: Cornell Barman Primary Care Nickola Lenig: Fulton Reek Other Clinician: Referring Dorlis Judice: Fulton Reek Treating Sparrow [place]/Extender: Tito Dine in Treatment: 4 Edema Assessment Assessed: [Left: No] [Right: No] [Left: Edema] [Right: :] Calf Left: Right: Point of Measurement: 36 cm From Medial Instep 43.5 cm Ankle Left: Right: Point of Measurement: 9 cm From Medial Instep 33 cm Vascular Assessment Pulses: Dorsalis Pedis Palpable: [Left:Yes] Electronic Signature(s) Signed: 10/12/2020 5:12:38 PM By: Gretta Cool, BSN, RN, CWS, Kim RN, BSN Entered By: Gretta Cool, BSN, RN, CWS, Kim on 10/11/2020 75:10:25 Hannah Howard ([id number]) -------------------------------------------------------------------------------- Multi Wound Chart Details Patient Name: Hannah Howard. Date of Service: 10/11/2020 9:00 AM Medical Record Number: [id number] Patient Account Number: [id number] Date of Birth/Sex: 08-Aug-1941 (79 y.o. F) Treating RN: Cornell Barman Primary Care Tenille Morrill: Fulton Reek Other Clinician: Referring Tahtiana Rozier: Fulton Reek Treating Adithya Difrancesco/Extender: Tito Dine in Treatment: 4 Vital Signs Height(in): 67 Pulse(bpm): 38 Weight(lbs): 200 Blood Pressure(mmHg): 162/80 Body Mass Index(BMI): 31 Temperature(F): 98.2 Respiratory Rate(breaths/min): 16 Photos: Wound Location: Left Lower Leg Left, Distal Forearm Left, Proximal Forearm Wounding Event: Trauma Trauma Trauma Primary Etiology: Abrasion Trauma, Other Trauma, Other Date  Acquired: 08/14/2020 10/06/2020 10/06/2020 Weeks of Treatment: 4 0 0 Wound Status: Healed - Epithelialized Open Open Measurements L x W x D (cm) 0x0x0 1.7x2.5x0.1 0.6x1x0.2 Area (cm) : 0 3.338 0.471 Volume (cm) : 0 0.334 0.094 % Reduction in Area: 100.00% N/A N/A % Reduction in Volume: 100.00% N/A N/A Classification: Partial Thickness Full Thickness Without Exposed Full Thickness Without Exposed Support Structures Support Structures Exudate Amount: None Present Large Small Exudate Type: N/A Sanguinous Sanguinous Exudate Color: N/A red red Wound  Margin: Flat and Intact Flat and Intact Flat and Intact Granulation Amount: None Present (0%) None Present (0%) None Present (0%) Necrotic Amount: Large (67-100%) Small (1-33%) Large (67-100%) Necrotic Tissue: Eschar N/A Adherent Slough Exposed Structures: Fascia: No Fat Layer (Subcutaneous Tissue): Fat Layer (Subcutaneous Tissue): Fat Layer (Subcutaneous Tissue): Yes Yes No Fascia: No Fascia: No Tendon: No Tendon: No Tendon: No Muscle: No Muscle: No Muscle: No Joint: No Joint: No Joint: No Bone: No Bone: No Bone: No Epithelialization: None None None Treatment Notes Electronic Signature(s) Signed: 10/17/2020 6:44:03 AM By: Linton Ham MD Entered By: Linton Ham on 10/11/2020 73:22:02 Hannah Howard ([id number]) -------------------------------------------------------------------------------- [place] Details Patient Name: Hannah Howard. Date of Service: 10/11/2020 9:00 AM Medical Record Number: [id number] Patient Account Number: [id number] Date of Birth/Sex: 15-Jul-1941 (79 y.o. F) Treating RN: Cornell Barman Primary Care Mujtaba Bollig: Fulton Reek Other Clinician: Referring Tene Gato: Fulton Reek Treating Brenin Heidelberger/Extender: Tito Dine in Treatment: 4 Active Inactive Abuse / Safety / Falls / Self Care Management Nursing Diagnoses: History of Falls Goals: Patient will not experience  any injury related to falls Date Initiated: 10/11/2020 Target Resolution Date: 10/25/2020 Goal Status: Active Interventions: Assess fall risk on admission and as needed Notes: Orientation to the Wound Care Program Nursing Diagnoses: Knowledge deficit related to the wound healing center program Goals: Patient/caregiver will verbalize understanding of the [place] Date Initiated: 09/13/2020 Target Resolution Date: 10/14/2020 Goal Status: Active Interventions: Provide education on orientation to the wound center Notes: Pain, Acute or Chronic Nursing Diagnoses: Pain Management - Non-cyclic Acute (Procedural) Potential alteration in comfort, pain Goals: Patient will verbalize adequate pain control and receive pain control interventions during procedures as needed Date Initiated: 09/13/2020 Target Resolution Date: 09/27/2020 Goal Status: Active Interventions: Assess comfort goal upon admission Treatment Activities: Administer pain control measures as ordered : 09/13/2020 Notes: Venous Leg Ulcer Nursing Diagnoses: Actual venous Insuffiency (use after diagnosis is confirmed) GoalsRENEE, ERB ([id number]) Patient will maintain optimal edema control Date Initiated: 09/13/2020 Target Resolution Date: 10/14/2020 Goal Status: Active Interventions: Assess peripheral edema status every visit. Treatment Activities: Therapeutic compression applied : 09/13/2020 Notes: Wound/Skin Impairment Nursing Diagnoses: Impaired tissue integrity Goals: Patient/caregiver will verbalize understanding of skin care regimen Date Initiated: 09/13/2020 Target Resolution Date: 09/27/2020 Goal Status: Active Ulcer/skin breakdown will have a volume reduction of 30% by week 4 Date Initiated: 09/13/2020 Target Resolution Date: 10/14/2020 Goal Status: Active Interventions: Provide education on ulcer and skin care Treatment Activities: Skin care regimen initiated :  09/13/2020 Topical wound management initiated : 09/13/2020 Notes: Electronic Signature(s) Signed: 10/12/2020 5:12:38 PM By: Gretta Cool, BSN, RN, CWS, Kim RN, BSN Entered By: Gretta Cool, BSN, RN, CWS, Kim on 10/11/2020 26:94:85 Hannah Howard ([id number]) -------------------------------------------------------------------------------- Pain Assessment Details Patient Name: Hannah Howard. Date of Service: 10/11/2020 9:00 AM Medical Record Number: [id number] Patient Account Number: [id number] Date of Birth/Sex: May 25, 1941 (79 y.o. F) Treating RN: Cornell Barman Primary Care Ronalee Scheunemann: Fulton Reek Other Clinician: Referring Hallie Ertl: Fulton Reek Treating Talon Regala/Extender: Tito Dine in Treatment: 4 Active Problems Location of Pain Severity and Description of Pain Patient Has Paino No Site Locations Pain Management and Medication Current Pain Management: Notes Patient denies pain at this time Electronic Signature(s) Signed: 10/12/2020 5:12:38 PM By: Gretta Cool, BSN, RN, CWS, Kim RN, BSN Entered By: Gretta Cool, BSN, RN, CWS, Kim on 10/11/2020 71:69:67 Hannah Howard ([id number]) -------------------------------------------------------------------------------- Patient/Caregiver Education Details Patient Name: Hannah Howard. Date of Service: 10/11/2020 9:00 AM Medical Record Number: [id number] Patient  Account Number: [id number] Date of Birth/Gender: [birth year] (79 y.o. F) Treating RN: Cornell Barman Primary Care Physician: Fulton Reek Other Clinician: Referring Physician: Fulton Reek Treating Physician/Extender: Tito Dine in Treatment: 4 Education Assessment Education Provided To: Patient Education Topics Provided Venous: Handouts: Other: Patient to call [facility] to checnk on status of order for compression wraps Responses: State content correctly Wound/Skin Impairment: Handouts: Caring for Your Ulcer, Other: wound care as prescribed Methods:  Demonstration, Explain/Verbal Responses: State content correctly Electronic Signature(s) Signed: 10/12/2020 5:12:38 PM By: Gretta Cool, BSN, RN, CWS, Kim RN, BSN Entered By: Gretta Cool, BSN, RN, CWS, Kim on 10/11/2020 02:40:97 Hannah Howard ([id number]) -------------------------------------------------------------------------------- Wound Assessment Details Patient Name: Hannah Howard. Date of Service: 10/11/2020 9:00 AM Medical Record Number: [id number] Patient Account Number: [id number] Date of Birth/Sex: 01/11/1941 (79 y.o. F) Treating RN: Cornell Barman Primary Care Alija Riano: Fulton Reek Other Clinician: Referring Clarkson Rosselli: Fulton Reek Treating Bushra Denman/Extender: Tito Dine in Treatment: 4 Wound Status Wound Number: 1 Primary Etiology: Abrasion Wound Location: Left Lower Leg Wound Status: Healed - Epithelialized Wounding Event: Trauma Date Acquired: 08/14/2020 Weeks Of Treatment: 4 Clustered Wound: No Photos Wound Measurements Length: (cm) 0 Width: (cm) 0 Depth: (cm) 0 Area: (cm) 0 Volume: (cm) 0 % Reduction in Area: 100% % Reduction in Volume: 100% Epithelialization: None Tunneling: No Undermining: No Wound Description Classification: Partial Thickness Wound Margin: Flat and Intact Exudate Amount: None Present Foul Odor After Cleansing: No Slough/Fibrino No Wound Bed Granulation Amount: None Present (0%) Exposed Structure Necrotic Amount: Large (67-100%) Fascia Exposed: No Necrotic Quality: Eschar Fat Layer (Subcutaneous Tissue) Exposed: No Tendon Exposed: No Muscle Exposed: No Joint Exposed: No Bone Exposed: No Electronic Signature(s) Signed: 10/12/2020 5:12:38 PM By: Gretta Cool, BSN, RN, CWS, Kim RN, BSN Entered By: Gretta Cool, BSN, RN, CWS, Kim on 10/11/2020 29:79:89 Hannah Howard ([id number]) -------------------------------------------------------------------------------- Wound Assessment Details Patient Name: Hannah Howard. Date of Service:  10/11/2020 9:00 AM Medical Record Number: [id number] Patient Account Number: [id number] Date of Birth/Sex: 02-04-1941 (79 y.o. F) Treating RN: Cornell Barman Primary Care Tyarra Nolton: Fulton Reek Other Clinician: Referring Tiasia Weberg: Fulton Reek Treating Melquisedec Journey/Extender: Tito Dine in Treatment: 4 Wound Status Wound Number: 2 Primary Etiology: Trauma, Other Wound Location: Left, Distal Forearm Wound Status: Open Wounding Event: Trauma Date Acquired: 10/06/2020 Weeks Of Treatment: 0 Clustered Wound: No Photos Wound Measurements Length: (cm) 1.7 Width: (cm) 2.5 Depth: (cm) 0.1 Area: (cm) 3.338 Volume: (cm) 0.334 % Reduction in Area: % Reduction in Volume: Epithelialization: None Tunneling: No Undermining: No Wound Description Classification: Full Thickness Without Exposed Support Structu Wound Margin: Flat and Intact Exudate Amount: Large Exudate Type: Sanguinous Exudate Color: red res Foul Odor After Cleansing: No Slough/Fibrino No Wound Bed Granulation Amount: None Present (0%) Exposed Structure Necrotic Amount: Small (1-33%) Fascia Exposed: No Fat Layer (Subcutaneous Tissue) Exposed: Yes Tendon Exposed: No Muscle Exposed: No Joint Exposed: No Bone Exposed: No Treatment Notes Wound #2 (Left, Distal Forearm) Notes xeroform gauze, abd, kerlix, stretch-net to secure Electronic Signature(s) Signed: 10/12/2020 5:12:38 PM By: Gretta Cool, BSN, RN, CWS, Kim RN, BSN [place], [place] ([id number]) Entered By: Gretta Cool, BSN, RN, CWS, Kim on 10/11/2020 78:58:85 Hannah Howard ([id number]) -------------------------------------------------------------------------------- Wound Assessment Details Patient Name: Hannah Howard. Date of Service: 10/11/2020 9:00 AM Medical Record Number: [id number] Patient Account Number: [id number] Date of Birth/Sex: Jan 19, 1941 (79 y.o. F) Treating RN: Cornell Barman Primary Care Zhamir Pirro: Fulton Reek Other Clinician: Referring  Bransyn Adami: Fulton Reek Treating [place]/Extender: Tito Dine in Treatment:  4 Wound Status Wound Number: 3 Primary Etiology: Trauma, Other Wound Location: Left, Proximal Forearm Wound Status: Open Wounding Event: Trauma Date Acquired: 10/06/2020 Weeks Of Treatment: 0 Clustered Wound: No Photos Wound Measurements Length: (cm) 0.6 Width: (cm) 1 Depth: (cm) 0.2 Area: (cm) 0.471 Volume: (cm) 0.094 % Reduction in Area: % Reduction in Volume: Epithelialization: None Tunneling: No Undermining: No Wound Description Classification: Full Thickness Without Exposed Support Structu Wound Margin: Flat and Intact Exudate Amount: Small Exudate Type: Sanguinous Exudate Color: red res Foul Odor After Cleansing: No Slough/Fibrino Yes Wound Bed Granulation Amount: None Present (0%) Exposed Structure Necrotic Amount: Large (67-100%) Fascia Exposed: No Necrotic Quality: Adherent Slough Fat Layer (Subcutaneous Tissue) Exposed: Yes Tendon Exposed: No Muscle Exposed: No Joint Exposed: No Bone Exposed: No Treatment Notes Wound #3 (Left, Proximal Forearm) Notes xeroform gauze, abd, kerlix, stretch-net to secure Electronic Signature(s) Signed: 10/12/2020 5:12:38 PM By: Gretta Cool, BSN, RN, CWS, Kim RN, BSN [place], [place] ([id number]) Entered By: Gretta Cool, BSN, RN, CWS, Kim on 10/11/2020 80:32:12 Hannah Howard ([id number]) -------------------------------------------------------------------------------- [place] Details Patient Name: Hannah Howard. Date of Service: 10/11/2020 9:00 AM Medical Record Number: [id number] Patient Account Number: [id number] Date of Birth/Sex: 1941/01/01 (79 y.o. F) Treating RN: Cornell Barman Primary Care Kenzley Ke: Fulton Reek Other Clinician: Referring Sanai Frick: Fulton Reek Treating Jahseh Lucchese/Extender: Tito Dine in Treatment: 4 Vital Signs Time Taken: 09:04 Temperature (F): 98.2 Height (in): 67 Pulse (bpm): 89 Weight  (lbs): 200 Respiratory Rate (breaths/min): 16 Body Mass Index (BMI): 31.3 Blood Pressure (mmHg): 162/80 Reference Range: 80 - 120 mg / dl Electronic Signature(s) Signed: 10/12/2020 5:12:38 PM By: Gretta Cool, BSN, RN, CWS, Kim RN, BSN Entered By: Gretta Cool, BSN, RN, CWS, Kim on 10/11/2020 09:04:52

## 2020-10-17 NOTE — Progress Notes (Signed)
Hannah Howard ([id number]) Visit Report for 10/11/2020 HPI Details Patient Name: Hannah Howard. Date of Service: 10/11/2020 9:00 AM Medical Record Number: [id number] Patient Account Number: [id number] Date of Birth/Sex: 1941/09/30 (79 y.o. F) Treating RN: Hannah Howard Primary Care Provider: Fulton Howard Other Clinician: Referring Provider: Fulton Howard Treating Provider/Extender: Hannah Howard in Treatment: 4 History of Present Illness HPI Description: ADMISSION 09/13/2020 This is a 79 year old woman who arrives with a traumatic wound on the left anterior lower leg. She tells [place] about 5 or 6 weeks ago a Coke can fell out of her fridge and hit her on the anterior leg. She had a fair amount of bleeding however lately she has been trying to heal this with rubbing alcohol and then more recently Neosporin. She is referred here by her primary physician for review of this. Past medical history includes Parkinson's disease, coming up cataract extraction on 10/21, rash of unknown etiology saw dermatology in July 2021, COPD, anxiety and depression, diffuse pain. She is not a diabetic. She does tell us that she has a lot of swelling in her legs over time her skin in the lower extremities is very tender ABI in our clinic was 1.14 11/3; 2-week follow-up. Traumatic wound in the left anterior lower leg in the setting of significant chronic venous insufficiency. She has been using Hydrofera Blue and bordered foam. This is just about closed. 11/17; 2-week follow-up. The traumatic area on the left anterior lower leg is fully epithelialized. She has changes suggestive of chronic venous insufficiency this initially occurred when a Coke can fell out of her fridge and hit her on the anterior leg. In spite of my initial skepticism we managed to heal this without compression she has chronic venous hypertension. We have ordered her Wallie Char wraps stockings however she did not connect with [facility] this  week we have given her instructions for this again. She had a fall this week and has a skin tear on the left dorsal forearm this is a new wound this week. Electronic Signature(s) Signed: 10/17/2020 6:44:03 AM By: Hannah Ham MD Entered By: Hannah Howard on 10/11/2020 35:00:93 Hannah Howard ([id number]) -------------------------------------------------------------------------------- Physical Exam Details Patient Name: Hannah Howard Date of Service: 10/11/2020 9:00 AM Medical Record Number: [id number] Patient Account Number: [id number] Date of Birth/Sex: 10-06-1941 (79 y.o. F) Treating RN: Hannah Howard Primary Care Provider: Fulton Howard Other Clinician: Referring Provider: Fulton Howard Treating Provider/Extender: Hannah Howard in Treatment: 4 Constitutional Sitting or standing Blood Pressure is within target range for patient.Marland Kitchen Respirations regular, non-labored and within target range.. Temperature is normal and within the target range for the patient.Marland Kitchen appears in no distress. Respiratory Respiratory effort is easy and symmetric bilaterally. Rate is normal at rest and on room air.. Cardiovascular Edema present in both extremities. Nonpitting. The about her ankle and medial feet. Integumentary (Hair, Skin) alot of bruising on her dorsal forearm skin is fragile. Notes Wound exam the area in question is on the left anterior mid tibia. This is fully epithelialized and healed. A bit surprising given the degree of edema in the left leg. Nevertheless this is closed over. oShe had a new wound on the left forearm which is a skin tear. The margins of this are healthy tissue looks healthy. Some friability. There is no evidence of surrounding infection Electronic Signature(s) Signed: 10/17/2020 6:44:03 AM By: Hannah Ham MD Entered By: Hannah Howard on 10/11/2020 01:75:10 Hannah Howard  ([id number]) -------------------------------------------------------------------------------- Physician Orders Details Patient Name: Hannah Howard  Hannah F. Date of Service: 10/11/2020 9:00 AM Medical Record Number: [id number] Patient Account Number: [id number] Date of Birth/Sex: May 27, 1941 (79 y.o. F) Treating RN: Hannah Howard Primary Care Provider: Fulton Howard Other Clinician: Referring Provider: Fulton Howard Treating Provider/Extender: Hannah Howard in Treatment: 4 Verbal / Phone Orders: No Diagnosis Coding Wound Cleansing Wound #2 Left,Distal Forearm o Clean wound with Normal Saline. Wound #3 Left,Proximal Forearm o Clean wound with Normal Saline. Anesthetic (add to Medication List) Wound #2 Left,Distal Forearm o Topical Lidocaine 4% cream applied to wound bed prior to debridement (In Clinic Only). Wound #3 Left,Proximal Forearm o Topical Lidocaine 4% cream applied to wound bed prior to debridement (In Clinic Only). Primary Wound Dressing Wound #2 Left,Distal Forearm o Xeroform Wound #3 Left,Proximal Forearm o Xeroform Secondary Dressing Wound #2 Left,Distal Forearm o ABD and Kerlix/Conform Wound #3 Left,Proximal Forearm o ABD and Kerlix/Conform Dressing Change Frequency Wound #2 Left,Distal Forearm o Change dressing every other day. Wound #3 Left,Proximal Forearm o Change dressing every other day. Follow-up Appointments Wound #2 Left,Distal Forearm o Return Appointment in 1 week. Wound #3 Left,Proximal Forearm o Return Appointment in 1 week. Edema Control o Patient to wear own Velcro compression garment. - Patient to call [facility] to pay for Farrow 4000 wraps o Elevate legs to the level of the heart and pump ankles as often as possible Electronic Signature(s) Signed: 10/12/2020 5:12:38 PM By: Hannah Howard, BSN, RN, CWS, Kim RN, BSN Signed: 10/17/2020 6:44:03 AM By: Hannah Ham MD Entered By: Hannah Howard, BSN, RN, CWS, Kim on  10/11/2020 95:62:13 Hannah Howard ([id number]) -------------------------------------------------------------------------------- Problem List Details Patient Name: Hannah Howard. Date of Service: 10/11/2020 9:00 AM Medical Record Number: [id number] Patient Account Number: [id number] Date of Birth/Sex: 1941-02-01 (79 y.o. 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 Date of Service: 10/11/2020 9:00 AM Medical Record Number: [id number] Patient Account Number: [id number] Date of Birth/Sex: Aug 13, 1941 (79 y.o. F) Treating RN: Hannah Howard Primary Care Provider: Fulton Howard Other Clinician: Referring Provider: Fulton Howard Treating Provider/Extender: Hannah Howard in Treatment: 4 Subjective History of Present Illness (HPI) ADMISSION 09/13/2020 This is a 79 year old woman who arrives with a traumatic wound on the left anterior lower leg. She tells [place] about 5 or 6 weeks ago a Coke can fell out of her fridge and hit her on the anterior leg. She had a fair  amount of bleeding however lately she has been trying to heal this with rubbing alcohol and then more recently Neosporin. She is referred here by her primary physician for review of this. Past medical history includes Parkinson's disease, coming up cataract extraction on 10/21, rash of unknown etiology saw dermatology in July 2021, COPD, anxiety and depression, diffuse pain. She is not a diabetic. She does tell us that she has a lot of swelling in her  legs over time her skin in the lower extremities is very tender ABI in our clinic was 1.14 11/3; 2-week follow-up. Traumatic wound in the left anterior lower leg in the setting of significant chronic venous insufficiency. She has been using Hydrofera Blue and bordered foam. This is just about closed. 11/17; 2-week follow-up. The traumatic area on the left anterior lower leg is fully epithelialized. She has changes suggestive of chronic venous insufficiency this initially occurred when a Coke can fell out of her fridge and hit her on the anterior leg. In spite of my initial skepticism we managed to heal this without compression she has chronic venous hypertension. We have ordered her Wallie Char wraps stockings however she did not connect with [facility] this week we have given her instructions for this again. She had a fall this week and has a skin tear on the left dorsal forearm this is a new wound this week. Objective Constitutional Sitting or standing Blood Pressure is within target range for patient.Marland Kitchen Respirations regular, non-labored and within target range.. Temperature is normal and within the target range for the patient.Marland Kitchen appears in no distress. Vitals Time Taken: 9:04 AM, Height: 67 in, Weight: 200 lbs, BMI: 31.3, Temperature: 98.2 F, Pulse: 89 bpm, Respiratory Rate: 16 breaths/min, Blood Pressure: 162/80 mmHg. Respiratory Respiratory effort is easy and symmetric bilaterally. Rate is normal at rest and on room air.. Cardiovascular Edema present  in both extremities. Nonpitting. The about her ankle and medial feet. General Notes: Wound exam the area in question is on the left anterior mid tibia. This is fully epithelialized and healed. A bit surprising given the degree of edema in the left leg. Nevertheless this is closed over. She had a new wound on the left forearm which is a skin tear. The margins of this are healthy tissue looks healthy. Some friability. There is no evidence of surrounding infection Integumentary (Hair, Skin) alot of bruising on her dorsal forearm skin is fragile. Wound #1 status is Healed - Epithelialized. Original cause of wound was Trauma. The wound is located on the Left Lower Leg. The wound measures 0cm length x 0cm width x 0cm depth; 0cm^2 area and 0cm^3 volume. There is no tunneling or undermining noted. There is a none present amount of drainage noted. The wound margin is flat and intact. There is no granulation within the wound bed. There is a large (67-100%) amount of necrotic tissue within the wound bed including Eschar. Wound #2 status is Open. Original cause of wound was Trauma. The wound is located on the Left,Distal Forearm. The wound measures 1.7cm length x 2.5cm width x 0.1cm depth; 3.338cm^2 area and 0.334cm^3 volume. There is Fat Layer (Subcutaneous Tissue) exposed. There is no tunneling or undermining noted. There is a large amount of sanguinous drainage noted. The wound margin is flat and intact. There is no granulation within the wound bed. There is a small (1-33%) amount of necrotic tissue within the wound bed. BERNARDINE, LANGWORTHY ([id number]) Wound #3 status is Open. Original cause of wound was Trauma. The wound is located on the Left,Proximal Forearm. The wound measures 0.6cm length x 1cm width x 0.2cm depth; 0.471cm^2 area and 0.094cm^3 volume. There is Fat Layer (Subcutaneous Tissue) exposed. There is no tunneling or undermining noted. There is a small amount of sanguinous drainage noted. The wound  margin is flat and intact. There is no granulation within the wound bed. There is a large (67-100%) amount of necrotic tissue within the wound bed including Adherent Slough. Assessment  Active Problems ICD-10 Abrasion, left lower leg, subsequent encounter Non-pressure chronic ulcer of other part of left lower leg limited to breakdown of skin Chronic venous hypertension (idiopathic) without complications of bilateral lower extremity Abrasion of left upper arm, subsequent encounter Plan Wound Cleansing: Wound #2 Left,Distal Forearm: Clean wound with Normal Saline. Wound #3 Left,Proximal Forearm: Clean wound with Normal Saline. Anesthetic (add to Medication List): Wound #2 Left,Distal Forearm: Topical Lidocaine 4% cream applied to wound bed prior to debridement (In Clinic Only). Wound #3 Left,Proximal Forearm: Topical Lidocaine 4% cream applied to wound bed prior to debridement (In Clinic Only). Primary Wound Dressing: Wound #2 Left,Distal Forearm: Xeroform Wound #3 Left,Proximal Forearm: Xeroform Secondary Dressing: Wound #2 Left,Distal Forearm: ABD and Kerlix/Conform Wound #3 Left,Proximal Forearm: ABD and Kerlix/Conform Dressing Change Frequency: Wound #2 Left,Distal Forearm: Change dressing every other day. Wound #3 Left,Proximal Forearm: Change dressing every other day. Follow-up Appointments: Wound #2 Left,Distal Forearm: Return Appointment in 1 week. Wound #3 Left,Proximal Forearm: Return Appointment in 1 week. Edema Control: Patient to wear own Velcro compression garment. - Patient to call [facility] to pay for Farrow 4000 wraps Elevate legs to the level of the heart and pump ankles as often as possible 1. Surprisingly the area on the left anterior lower leg is closed. She would benefit from stockings if she can get them on. 2. Skin tear on her left forearm. This is clean margins of the skin looked healthy. No debridement required we use Xeroform and gauze. We  will see her again next week to look at the arm and also to see if she is able to put on the stockings that we have ordered Electronic Signature(s) Signed: 10/17/2020 6:44:03 AM By: Hannah Ham MD Entered By: Hannah Howard on 10/11/2020 28:76:81 Hannah Howard ([id number]) -------------------------------------------------------------------------------- [place] Details Patient Name: Hannah Howard. Date of Service: 10/11/2020 Medical Record Number: [id number] Patient Account Number: [id number] Date of Birth/Sex: 04-02-1941 (79 y.o. F) Treating RN: Hannah Howard Primary Care Provider: Fulton Howard Other Clinician: Referring Provider: Fulton Howard Treating Provider/Extender: Hannah Howard in Treatment: 4 Diagnosis Coding ICD-10 Codes Code Description [phone] Abrasion, left lower leg, subsequent encounter L97.821 Non-pressure chronic ulcer of other part of left lower leg limited to breakdown of skin I87.303 Chronic venous hypertension (idiopathic) without complications of bilateral lower extremity S40.812D Abrasion of left upper arm, subsequent encounter Facility Procedures CPT4 Code: [id number] Description: 99214 - WOUND CARE VISIT-LEV 4 EST PT Modifier: Quantity: 1 Physician Procedures CPT4 Code: [id number] Description: 88891 - WC PHYS LEVEL 3 - EST PT Modifier: Quantity: 1 CPT4 Code: Description: ICD-10 Diagnosis Description L97.821 Non-pressure chronic ulcer of other part of left lower leg limited to bre I87.303 Chronic venous hypertension (idiopathic) without complications of bilater S40.812D Abrasion of left upper arm, subsequent  encounter Modifier: akdown of skin al lower extremity Quantity: Electronic Signature(s) Signed: 10/17/2020 6:44:03 AM By: Hannah Ham MD Entered By: Hannah Howard on 10/11/2020 09:44:34

## 2020-10-18 ENCOUNTER — Other Ambulatory Visit: Payer: Self-pay

## 2020-10-18 DIAGNOSIS — S81802A Unspecified open wound, left lower leg, initial encounter: Secondary | ICD-10-CM | POA: Diagnosis not present

## 2020-10-23 NOTE — Progress Notes (Signed)
Hannah Howard ([id number]) Visit Report for 10/18/2020 Arrival Information Details Patient Name: [name], [name]. Date of Service: 10/18/2020 9:30 AM Medical Record Number: [id number] Patient Account Number: [id number] Date of Birth/Sex: [birth year] (79 y.o. F) Treating RN: Dolan Amen Primary Care Kert Shackett: Fulton Reek Other Clinician: Referring My Madariaga: Fulton Reek Treating Derrian Rodak/Extender: Skipper Cliche in Treatment: 5 Visit Information History Since Last Visit Pain Present Now: No Patient Arrived: Walker Arrival Time: 09:54 Accompanied By: granddaughter Transfer Assistance: None Patient Identification Verified: Yes Secondary Verification Process Completed: Yes Patient Requires Transmission-Based Precautions: No Patient Has Alerts: No Electronic Signature(s) Signed: 10/23/2020 4:23:10 PM By: Georges Mouse, Minus Breeding Entered By: Georges Mouse, Minus Breeding on 10/18/2020 56:43:32 Hannah Howard ([id number]) -------------------------------------------------------------------------------- Clinic Level of Care Assessment Details Patient Name: Hannah Howard Date of Service: 10/18/2020 9:30 AM Medical Record Number: [id number] Patient Account Number: [id number] Date of Birth/Sex: Mar 05, 1941 (79 y.o. F) Treating RN: Cornell Barman Primary Care Daved Mcfann: Fulton Reek Other Clinician: Referring Nakeem Murnane: Fulton Reek Treating Krishang Reading/Extender: Skipper Cliche in Treatment: 5 Clinic Level of Care Assessment Items TOOL 4 Quantity Score []  - Use when only an EandM is performed on FOLLOW-UP visit 0 ASSESSMENTS - Nursing Assessment / Reassessment X - Reassessment of Co-morbidities (includes updates in patient status) 1 10 X- 1 5 Reassessment of Adherence to Treatment Plan ASSESSMENTS - Wound and Skin Assessment / Reassessment X - Simple Wound Assessment / Reassessment - one wound 1 5 []  - 0 Complex Wound Assessment / Reassessment - multiple wounds []  -  0 Dermatologic / Skin Assessment (not related to wound area) ASSESSMENTS - Focused Assessment []  - Circumferential Edema Measurements - multi extremities 0 []  - 0 Nutritional Assessment / Counseling / Intervention []  - 0 Lower Extremity Assessment (monofilament, tuning fork, pulses) []  - 0 Peripheral Arterial Disease Assessment (using hand held doppler) ASSESSMENTS - Ostomy and/or Continence Assessment and Care []  - Incontinence Assessment and Management 0 []  - 0 Ostomy Care Assessment and Management (repouching, etc.) PROCESS - Coordination of Care X - Simple Patient / Family Education for ongoing care 1 15 []  - 0 Complex (extensive) Patient / Family Education for ongoing care []  - 0 Staff obtains Programmer, systems, Records, Test Results / Process Orders []  - 0 Staff telephones HHA, Nursing Homes / Clarify orders / etc []  - 0 Routine Transfer to another Facility (non-emergent condition) []  - 0 Routine Hospital Admission (non-emergent condition) []  - 0 New Admissions / Biomedical engineer / Ordering NPWT, Apligraf, etc. []  - 0 Emergency Hospital Admission (emergent condition) X- 1 10 Simple Discharge Coordination []  - 0 Complex (extensive) Discharge Coordination PROCESS - Special Needs []  - Pediatric / Minor Patient Management 0 []  - 0 Isolation Patient Management []  - 0 Hearing / Language / Visual special needs []  - 0 Assessment of Community assistance (transportation, D/C planning, etc.) []  - 0 Additional assistance / Altered mentation []  - 0 Support Surface(s) Assessment (bed, cushion, seat, etc.) INTERVENTIONS - Wound Cleansing / Measurement [name], [name] ([id number]) []  - 0 Simple Wound Cleansing - one wound []  - 0 Complex Wound Cleansing - multiple wounds []  - 0 Wound Imaging (photographs - any number of wounds) []  - 0 Wound Tracing (instead of photographs) []  - 0 Simple Wound Measurement - one wound []  - 0 Complex Wound Measurement - multiple  wounds INTERVENTIONS - Wound Dressings []  - Small Wound Dressing one or multiple wounds 0 X- 1 15 Medium Wound Dressing one or multiple wounds []  - 0 Large Wound Dressing one  or multiple wounds []  - 0 Application of Medications - topical []  - 0 Application of Medications - injection INTERVENTIONS - Miscellaneous []  - External ear exam 0 []  - 0 Specimen Collection (cultures, biopsies, blood, body fluids, etc.) []  - 0 Specimen(s) / Culture(s) sent or taken to Lab for analysis []  - 0 Patient Transfer (multiple staff / Civil Service fast streamer / Similar devices) []  - 0 Simple Staple / Suture removal (25 or less) []  - 0 Complex Staple / Suture removal (26 or more) []  - 0 Hypo / Hyperglycemic Management (close monitor of Blood Glucose) []  - 0 Ankle / Brachial Index (ABI) - do not check if billed separately []  - 0 Vital Signs Has the patient been seen at the hospital within the last three years: Yes Total Score: 60 Level Of Care: New/Established - Level 2 Electronic Signature(s) Signed: 10/18/2020 3:19:47 PM By: Gretta Cool, BSN, RN, CWS, Kim RN, BSN Entered By: Gretta Cool, BSN, RN, CWS, Kim on 10/18/2020 74:82:70 Hannah Howard ([id number]) -------------------------------------------------------------------------------- Encounter Discharge Information Details Patient Name: Hannah Howard. Date of Service: 10/18/2020 9:30 AM Medical Record Number: [id number] Patient Account Number: [id number] Date of Birth/Sex: Feb 22, 1941 (79 y.o. F) Treating RN: Dolan Amen Primary Care Maebry Obrien: Fulton Reek Other Clinician: Referring Tayon Parekh: Fulton Reek Treating Clair Alfieri/Extender: Skipper Cliche in Treatment: 5 Encounter Discharge Information Items Discharge Condition: Stable Ambulatory Status: Walker Discharge Destination: Home Transportation: Private Auto Accompanied By: granddaughter Schedule Follow-up Appointment: Yes Clinical Summary of Care: Electronic Signature(s) Signed: 10/23/2020  4:23:10 PM By: Georges Mouse, Minus Breeding Entered By: Georges Mouse, Minus Breeding on 10/18/2020 75:88:32 Hannah Howard ([id number]) -------------------------------------------------------------------------------- Wound Assessment Details Patient Name: Hannah Howard Date of Service: 10/18/2020 9:30 AM Medical Record Number: [id number] Patient Account Number: [id number] Date of Birth/Sex: 1941-04-13 (79 y.o. F) Treating RN: Dolan Amen Primary Care Zacharia Sowles: Fulton Reek Other Clinician: Referring Damari Hiltz: Fulton Reek Treating Jadae Steinke/Extender: Skipper Cliche in Treatment: 5 Wound Status Wound Number: 2 Primary Etiology: Trauma, Other Wound Location: Left, Distal Forearm Wound Status: Open Wounding Event: Trauma Date Acquired: 10/06/2020 Weeks Of Treatment: 1 Clustered Wound: No Wound Measurements Length: (cm) 1.7 Width: (cm) 2.5 Depth: (cm) 0.1 Area: (cm) 3.338 Volume: (cm) 0.334 % Reduction in Area: 0% % Reduction in Volume: 0% Epithelialization: None Wound Description Classification: Full Thickness Without Exposed Support Structu Wound Margin: Flat and Intact Exudate Amount: Large Exudate Type: Sanguinous Exudate Color: red res Foul Odor After Cleansing: No Slough/Fibrino No Wound Bed Granulation Amount: None Present (0%) Exposed Structure Necrotic Amount: Small (1-33%) Fascia Exposed: No Fat Layer (Subcutaneous Tissue) Exposed: Yes Tendon Exposed: No Muscle Exposed: No Joint Exposed: No Bone Exposed: No Treatment Notes Wound #2 (Left, Distal Forearm) Notes xeroform, abd, conform, stretch net Electronic Signature(s) Signed: 10/23/2020 4:23:10 PM By: Georges Mouse, Minus Breeding Entered By: Georges Mouse, Minus Breeding on 10/18/2020 08:81:10 Hannah Howard ([id number]) -------------------------------------------------------------------------------- Wound Assessment Details Patient Name: Hannah Howard. Date of Service: 10/18/2020 9:30 AM Medical Record  Number: [id number] Patient Account Number: [id number] Date of Birth/Sex: 02-03-1941 (79 y.o. F) Treating RN: Dolan Amen Primary Care Britany Callicott: Fulton Reek Other Clinician: Referring Letasha Kershaw: Fulton Reek Treating Cheyane Ayon/Extender: Skipper Cliche in Treatment: 5 Wound Status Wound Number: 3 Primary Etiology: Trauma, Other Wound Location: Left, Proximal Forearm Wound Status: Open Wounding Event: Trauma Date Acquired: 10/06/2020 Weeks Of Treatment: 1 Clustered Wound: No Wound Measurements Length: (cm) 0.6 Width: (cm) 1 Depth: (cm) 0.2 Area: (cm) 0.471 Volume: (cm) 0.094 % Reduction in Area: 0% % Reduction in Volume: 0% Wound Description  Classification: Full Thickness Without Exposed Support Structure s Treatment Notes Wound #3 (Left, Proximal Forearm) Notes xeroform, abd, conform, stretch net Electronic Signature(s) Signed: 10/23/2020 4:23:10 PM By: Georges Mouse, [place] Entered By: Georges Mouse, Minus Breeding on 10/18/2020 10:10:47

## 2020-10-25 ENCOUNTER — Other Ambulatory Visit: Payer: Self-pay

## 2020-10-25 ENCOUNTER — Encounter: Payer: Medicare Other | Attending: Internal Medicine | Admitting: Internal Medicine

## 2020-10-25 DIAGNOSIS — S81802A Unspecified open wound, left lower leg, initial encounter: Secondary | ICD-10-CM | POA: Diagnosis present

## 2020-10-25 DIAGNOSIS — Z880 Allergy status to penicillin: Secondary | ICD-10-CM | POA: Diagnosis not present

## 2020-10-25 DIAGNOSIS — Z87891 Personal history of nicotine dependence: Secondary | ICD-10-CM | POA: Insufficient documentation

## 2020-10-26 NOTE — Progress Notes (Signed)
DAJAE, KIZER ([id number]) Visit Report for 10/25/2020 Arrival Information Details Patient Name: MCKENNA, GAMM. Date of Service: 10/25/2020 12:30 PM Medical Record Number: [id number] Patient Account Number: [id number] Date of Birth/Sex: Aug 01, 1941 (79 y.o. F) Treating RN: Dolan Amen Primary Care Sadiel Mota: Fulton Reek Other Clinician: Referring Kaniya Trueheart: Fulton Reek Treating Ghislaine Harcum/Extender: Tito Dine in Treatment: 6 Visit Information History Since Last Visit Pain Present Now: No Patient Arrived: Walker Arrival Time: 12:43 Accompanied By: self Transfer Assistance: None Patient Requires Transmission-Based Precautions: No Patient Has Alerts: No Electronic Signature(s) Signed: 10/25/2020 5:18:58 PM By: Georges Mouse, Minus Breeding Entered By: Georges Mouse, Minus Breeding on 10/25/2020 99:35:70 [name] ([id number]) -------------------------------------------------------------------------------- Clinic Level of Care Assessment Details Patient Name: [name] Date of Service: 10/25/2020 12:30 PM Medical Record Number: [id number] Patient Account Number: [id number] Date of Birth/Sex: [birth year] (79 y.o. F) Treating RN: Dolan Amen Primary Care Phuong Hillary: Fulton Reek Other Clinician: Referring Hillman Attig: Fulton Reek Treating Zeanna Sunde/Extender: Tito Dine in Treatment: 6 Clinic Level of Care Assessment Items TOOL 4 Quantity Score X - Use when only an EandM is performed on FOLLOW-UP visit 1 0 ASSESSMENTS - Nursing Assessment / Reassessment X - Reassessment of Co-morbidities (includes updates in patient status) 1 10 X- 1 5 Reassessment of Adherence to Treatment Plan ASSESSMENTS - Wound and Skin Assessment / Reassessment X - Simple Wound Assessment / Reassessment - one wound 1 5 []  - 0 Complex Wound Assessment / Reassessment - multiple wounds []  - 0 Dermatologic / Skin Assessment (not related to wound area) ASSESSMENTS - Focused  Assessment []  - Circumferential Edema Measurements - multi extremities 0 []  - 0 Nutritional Assessment / Counseling / Intervention []  - 0 Lower Extremity Assessment (monofilament, tuning fork, pulses) []  - 0 Peripheral Arterial Disease Assessment (using hand held doppler) ASSESSMENTS - Ostomy and/or Continence Assessment and Care []  - Incontinence Assessment and Management 0 []  - 0 Ostomy Care Assessment and Management (repouching, etc.) PROCESS - Coordination of Care X - Simple Patient / Family Education for ongoing care 1 15 []  - 0 Complex (extensive) Patient / Family Education for ongoing care []  - 0 Staff obtains Programmer, systems, Records, Test Results / Process Orders []  - 0 Staff telephones HHA, Nursing Homes / Clarify orders / etc []  - 0 Routine Transfer to another Facility (non-emergent condition) []  - 0 Routine Hospital Admission (non-emergent condition) []  - 0 New Admissions / Biomedical engineer / Ordering NPWT, Apligraf, etc. []  - 0 Emergency Hospital Admission (emergent condition) X- 1 10 Simple Discharge Coordination []  - 0 Complex (extensive) Discharge Coordination PROCESS - Special Needs []  - Pediatric / Minor Patient Management 0 []  - 0 Isolation Patient Management []  - 0 Hearing / Language / Visual special needs []  - 0 Assessment of Community assistance (transportation, D/C planning, etc.) []  - 0 Additional assistance / Altered mentation []  - 0 Support Surface(s) Assessment (bed, cushion, seat, etc.) INTERVENTIONS - Wound Cleansing / Measurement Govoni, Daniele F. ([id number]) X- 1 5 Simple Wound Cleansing - one wound []  - 0 Complex Wound Cleansing - multiple wounds X- 1 5 Wound Imaging (photographs - any number of wounds) []  - 0 Wound Tracing (instead of photographs) X- 1 5 Simple Wound Measurement - one wound []  - 0 Complex Wound Measurement - multiple wounds INTERVENTIONS - Wound Dressings X - Small Wound Dressing one or multiple wounds 1  10 []  - 0 Medium Wound Dressing one or multiple wounds []  - 0 Large Wound Dressing one or multiple wounds []  -  0 Application of Medications - topical []  - 0 Application of Medications - injection INTERVENTIONS - Miscellaneous []  - External ear exam 0 []  - 0 Specimen Collection (cultures, biopsies, blood, body fluids, etc.) []  - 0 Specimen(s) / Culture(s) sent or taken to Lab for analysis []  - 0 Patient Transfer (multiple staff / Civil Service fast streamer / Similar devices) []  - 0 Simple Staple / Suture removal (25 or less) []  - 0 Complex Staple / Suture removal (26 or more) []  - 0 Hypo / Hyperglycemic Management (close monitor of Blood Glucose) []  - 0 Ankle / Brachial Index (ABI) - do not check if billed separately X- 1 5 Vital Signs Has the patient been seen at the hospital within the last three years: Yes Total Score: 75 Level Of Care: New/Established - Level 2 Electronic Signature(s) Signed: 10/25/2020 5:18:58 PM By: Georges Mouse, Minus Breeding Entered By: Georges Mouse, Minus Breeding on 10/25/2020 43:32:95 [name] ([id number]) -------------------------------------------------------------------------------- Encounter Discharge Information Details Patient Name: [name]. Date of Service: 10/25/2020 12:30 PM Medical Record Number: [id number] Patient Account Number: [id number] Date of Birth/Sex: July 08, 1941 (79 y.o. F) Treating RN: Dolan Amen Primary Care Zeek Rostron: Fulton Reek Other Clinician: Referring Peg Fifer: Fulton Reek Treating Ranyia Witting/Extender: Tito Dine in Treatment: 6 Encounter Discharge Information Items Discharge Condition: Stable Ambulatory Status: Walker Discharge Destination: Home Transportation: Private Auto Accompanied By: self Schedule Follow-up Appointment: No Clinical Summary of Care: Electronic Signature(s) Signed: 10/25/2020 5:18:58 PM By: Georges Mouse, Minus Breeding Entered By: Georges Mouse, Minus Breeding on 10/25/2020 23:55:73 [name] ([id number]) -------------------------------------------------------------------------------- Lower Extremity Assessment Details Patient Name: [name] Date of Service: 10/25/2020 12:30 PM Medical Record Number: [id number] Patient Account Number: [id number] Date of Birth/Sex: Nov 24, 1941 (79 y.o. F) Treating RN: Dolan Amen Primary Care Gail Vendetti: Fulton Reek Other Clinician: Referring Chela Sutphen: Fulton Reek Treating Mikias Lanz/Extender: Tito Dine in Treatment: 6 Electronic Signature(s) Signed: 10/25/2020 5:18:58 PM By: Georges Mouse, Minus Breeding Entered By: Georges Mouse, Minus Breeding on 10/25/2020 51:76:16 [name] ([id number]) -------------------------------------------------------------------------------- Multi Wound Chart Details Patient Name: [name] Date of Service: 10/25/2020 12:30 PM Medical Record Number: [id number] Patient Account Number: [id number] Date of Birth/Sex: [birth year] (79 y.o. F) Treating RN: Cornell Barman Primary Care Tate Zagal: Fulton Reek Other Clinician: Referring Matheo Rathbone: Fulton Reek Treating Liyana Suniga/Extender: Tito Dine in Treatment: 6 Vital Signs Height(in): 67 Pulse(bpm): 97 Weight(lbs): 200 Blood Pressure(mmHg): 144/78 Body Mass Index(BMI): 31 Temperature(F): 98.4 Respiratory Rate(breaths/min): 16 Photos: [3:No Photos] [N/A:N/A] Wound Location: Left, Distal Forearm Left, Proximal Forearm N/A Wounding Event: Trauma Trauma N/A Primary Etiology: Trauma, Other Trauma, Other N/A Date Acquired: 10/06/2020 10/06/2020 N/A Weeks of Treatment: 2 2 N/A Wound Status: Open Healed - Epithelialized N/A Measurements L x W x D (cm) 0.9x0.2x0.1 0x0x0 N/A Area (cm) : 0.141 0 N/A Volume (cm) : 0.014 0 N/A % Reduction in Area: 95.80% 100.00% N/A % Reduction in Volume: 95.80% 100.00% N/A Classification: Full Thickness Without Exposed Full Thickness Without Exposed N/A Support Structures Support  Structures Exudate Amount: Small N/A N/A Exudate Type: Serosanguineous N/A N/A Exudate Color: red, brown N/A N/A Wound Margin: Flat and Intact N/A N/A Granulation Amount: Large (67-100%) N/A N/A Granulation Quality: Red N/A N/A Necrotic Amount: None Present (0%) N/A N/A Exposed Structures: Fat Layer (Subcutaneous Tissue): N/A N/A Yes Fascia: No Tendon: No Muscle: No Joint: No Bone: No Epithelialization: Medium (34-66%) N/A N/A Treatment Notes Electronic Signature(s) Signed: 10/26/2020 12:56:38 PM By: Linton Ham MD Entered By: Linton Ham on 10/25/2020 35:00:93 [name] ([id number]) --------------------------------------------------------------------------------  Multi-Disciplinary Care Plan Details Patient Name: VANITY, LARSSON. Date of Service: 10/25/2020 12:30 PM Medical Record Number: [id number] Patient Account Number: [id number] Date of Birth/Sex: 1941-04-13 (79 y.o. F) Treating RN: Dolan Amen Primary Care Soleil Mas: Fulton Reek Other Clinician: Referring Harrel Ferrone: Fulton Reek Treating Aiyana Stegmann/Extender: Tito Dine in Treatment: 6 Active Inactive Electronic Signature(s) Signed: 10/25/2020 5:18:58 PM By: Georges Mouse, Minus Breeding Entered By: Georges Mouse, Minus Breeding on 10/25/2020 74:94:49 [name] ([id number]) -------------------------------------------------------------------------------- Pain Assessment Details Patient Name: [name] Date of Service: 10/25/2020 12:30 PM Medical Record Number: [id number] Patient Account Number: [id number] Date of Birth/Sex: July 19, 1941 (79 y.o. F) Treating RN: Dolan Amen Primary Care Artavis Cowie: Fulton Reek Other Clinician: Referring Aldwin Micalizzi: Fulton Reek Treating Nyjah Denio/Extender: Tito Dine in Treatment: 6 Active Problems Location of Pain Severity and Description of Pain Patient Has Paino No Site Locations Rate the pain. Current Pain Level: 0 Pain Management  and Medication Current Pain Management: Electronic Signature(s) Signed: 10/25/2020 5:18:58 PM By: Georges Mouse, Kenia Entered By: Georges Mouse, Minus Breeding on 10/25/2020 17:79:39 [name] ([id number]) -------------------------------------------------------------------------------- Patient/Caregiver Education Details Patient Name: [name] Date of Service: 10/25/2020 12:30 PM Medical Record Number: [id number] Patient Account Number: [id number] Date of Birth/Gender: Jun 22, 1941 (79 y.o. F) Treating RN: Dolan Amen Primary Care Physician: Fulton Reek Other Clinician: Referring Physician: Fulton Reek Treating Physician/Extender: Tito Dine in Treatment: 6 Education Assessment Education Provided To: Patient Education Topics Provided Notes continue polysporin and bandaid at home Electronic Signature(s) Signed: 10/25/2020 5:18:58 PM By: Georges Mouse, Minus Breeding Entered By: Georges Mouse, Minus Breeding on 10/25/2020 54:56:25 [name] ([id number]) -------------------------------------------------------------------------------- Wound Assessment Details Patient Name: [name] Date of Service: 10/25/2020 12:30 PM Medical Record Number: [id number] Patient Account Number: [id number] Date of Birth/Sex: [birth year] (79 y.o. F) Treating RN: Dolan Amen Primary Care Ahnaf Caponi: Fulton Reek Other Clinician: Referring Morio Widen: Fulton Reek Treating Tymika Grilli/Extender: Tito Dine in Treatment: 6 Wound Status Wound Number: 2 Primary Etiology: Trauma, Other Wound Location: Left, Distal Forearm Wound Status: Healed - Epithelialized Wounding Event: Trauma Date Acquired: 10/06/2020 Weeks Of Treatment: 2 Clustered Wound: No Photos Wound Measurements Length: (cm) 0 Width: (cm) 0 Depth: (cm) 0 Area: (cm) Volume: (cm) % Reduction in Area: 100% % Reduction in Volume: 100% Epithelialization: Medium (34-66%) 0 Tunneling: No 0  Undermining: No Wound Description Classification: Full Thickness Without Exposed Support Structu Wound Margin: Flat and Intact Exudate Amount: Small Exudate Type: Serosanguineous Exudate Color: red, brown res Foul Odor After Cleansing: No Slough/Fibrino No Wound Bed Granulation Amount: Large (67-100%) Exposed Structure Granulation Quality: Red Fascia Exposed: No Necrotic Amount: None Present (0%) Fat Layer (Subcutaneous Tissue) Exposed: Yes Tendon Exposed: No Muscle Exposed: No Joint Exposed: No Bone Exposed: No Electronic Signature(s) Signed: 10/25/2020 5:18:58 PM By: Georges Mouse, Minus Breeding Entered By: Georges Mouse, Minus Breeding on 10/25/2020 62:03:55 [name] ([id number]) -------------------------------------------------------------------------------- Wound Assessment Details Patient Name: [name]. Date of Service: 10/25/2020 12:30 PM Medical Record Number: [id number] Patient Account Number: [id number] Date of Birth/Sex: 1941-01-21 (79 y.o. F) Treating RN: Dolan Amen Primary Care Johnchristopher Sarvis: Fulton Reek Other Clinician: Referring Jaci Desanto: Fulton Reek Treating Neely Kammerer/Extender: Tito Dine in Treatment: 6 Wound Status Wound Number: 3 Primary Etiology: Trauma, Other Wound Location: Left, Proximal Forearm Wound Status: Healed - Epithelialized Wounding Event: Trauma Date Acquired: 10/06/2020 Weeks Of Treatment: 2 Clustered Wound: No Wound Measurements Length: (cm) Width: (cm) Depth: (cm) Area: (cm) Volume: (cm) 0 % Reduction in Area: 100% 0 % Reduction in Volume: 100%  0 0 0 Wound Description Classification: Full Thickness Without Exposed Support Structu res Electronic Signature(s) Signed: 10/25/2020 5:18:58 PM By: Georges Mouse, Kenia Entered By: Georges Mouse, Minus Breeding on 10/25/2020 75:79:72 [name] ([id number]) -------------------------------------------------------------------------------- [place] Details Patient  Name: [name] Date of Service: 10/25/2020 12:30 PM Medical Record Number: [id number] Patient Account Number: [id number] Date of Birth/Sex: [birth year] (79 y.o. F) Treating RN: Dolan Amen Primary Care Jaquavion Mccannon: Fulton Reek Other Clinician: Referring Darlina Mccaughey: Fulton Reek Treating Reigna Ruperto/Extender: Tito Dine in Treatment: 6 Vital Signs Time Taken: 12:43 Temperature (F): 98.4 Height (in): 67 Pulse (bpm): 97 Weight (lbs): 200 Respiratory Rate (breaths/min): 16 Body Mass Index (BMI): 31.3 Blood Pressure (mmHg): 144/78 Reference Range: 80 - 120 mg / dl Electronic Signature(s) Signed: 10/25/2020 5:18:58 PM By: Georges Mouse, Minus Breeding Entered By: Georges Mouse, Minus Breeding on 10/25/2020 12:46:47

## 2020-10-26 NOTE — Progress Notes (Signed)
MICHAELE, AMUNDSON ([id number]) Visit Report for 10/25/2020 HPI Details Patient Name: Hannah Howard, SCARBROUGH. Date of Service: 10/25/2020 12:30 PM Medical Record Number: [id number] Patient Account Number: [id number] Date of Birth/Sex: 08/31/1941 (78 y.o. F) Treating RN: Cornell Barman Primary Care Provider: Fulton Reek Other Clinician: Referring Provider: Fulton Reek Treating Provider/Extender: Tito Dine in Treatment: 6 History of Present Illness HPI Description: ADMISSION 09/13/2020 This is a 79 year old woman who arrives with a traumatic wound on the left anterior lower leg. She tells [place] about 5 or 6 weeks ago a Coke can fell out of her fridge and hit her on the anterior leg. She had a fair amount of bleeding however lately she has been trying to heal this with rubbing alcohol and then more recently Neosporin. She is referred here by her primary physician for review of this. Past medical history includes Parkinson's disease, coming up cataract extraction on 10/21, rash of unknown etiology saw dermatology in July 2021, COPD, anxiety and depression, diffuse pain. She is not a diabetic. She does tell us that she has a lot of swelling in her legs over time her skin in the lower extremities is very tender ABI in our clinic was 1.14 11/3; 2-week follow-up. Traumatic wound in the left anterior lower leg in the setting of significant chronic venous insufficiency. She has been using Hydrofera Blue and bordered foam. This is just about closed. 11/17; 2-week follow-up. The traumatic area on the left anterior lower leg is fully epithelialized. She has changes suggestive of chronic venous insufficiency this initially occurred when a Coke can fell out of her fridge and hit her on the anterior leg. In spite of my initial skepticism we managed to heal this without compression she has chronic venous hypertension. We have ordered her Wallie Char wraps stockings however she did not connect with prism this  week we have given her instructions for this again. She had a fall this week and has a skin tear on the left dorsal forearm this is a new wound this week. 12/1; 2-week follow-up. She has not gotten any of the external compression stockings that we suggested last time. She came in today asking about insurance coverage she did not have an open wound I think that is the issue here. In any case she did not have any stockings on her legs. She cannot get standard over the toes stockings on. oShe has not been putting anything on the skin tear of the left arm. With that being said this is just about healed on its own. She has not been putting anything on this Electronic Signature(s) Signed: 10/26/2020 12:56:38 PM By: Linton Ham MD Entered By: Linton Ham on 10/25/2020 83:41:96 Hannah Howard ([id number]) -------------------------------------------------------------------------------- Physical Exam Details Patient Name: Hannah Howard Date of Service: 10/25/2020 12:30 PM Medical Record Number: [id number] Patient Account Number: [id number] Date of Birth/Sex: May 08, 1941 (79 y.o. F) Treating RN: Cornell Barman Primary Care Provider: Fulton Reek Other Clinician: Referring Provider: Fulton Reek Treating Provider/Extender: Tito Dine in Treatment: 6 Notes Wound exam; the area in question is on the left anterior mid tibia is still closed. She has lymphedema but no open wounds on her legs oThe area on her arm which was a large skin tear the last time she was here is just about epithelialize she has not put anything on this at all. Nevertheless the area is clean Electronic Signature(s) Signed: 10/26/2020 12:56:38 PM By: Linton Ham MD Entered By: Linton Ham on 10/25/2020 14:48:18 Verta Ellen  Wanda Plump ([id number]) -------------------------------------------------------------------------------- Physician Orders Details Patient Name: Hannah Howard. Date of Service: 10/25/2020  12:30 PM Medical Record Number: [id number] Patient Account Number: [id number] Date of Birth/Sex: 1940-12-07 (79 y.o. F) Treating RN: Dolan Amen Primary Care Provider: Fulton Reek Other Clinician: Referring Provider: Fulton Reek Treating Provider/Extender: Tito Dine in Treatment: 6 Verbal / Phone Orders: No Diagnosis Coding Discharge From [facility] Wound #2 Left,Distal Forearm o Discharge from [facility] - treatment complete Electronic Signature(s) Signed: 10/25/2020 5:18:58 PM By: Charlett Nose Signed: 10/26/2020 12:56:38 PM By: Linton Ham MD Entered By: Georges Mouse, Minus Breeding on 10/25/2020 09:47:09 Hannah Howard ([id number]) -------------------------------------------------------------------------------- Problem List Details Patient Name: Hannah Howard. Date of Service: 10/25/2020 12:30 PM Medical Record Number: [id number] Patient Account Number: [id number] Date of Birth/Sex: [birth year] (79 y.o. 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 Date of Service: 10/25/2020 12:30 PM Medical Record Number: [id number] Patient Account Number: [id number] Date of Birth/Sex: 1941-11-02 (79 y.o. F) Treating RN: Cornell Barman Primary Care Provider: Fulton Reek Other Clinician: Referring Provider: Fulton Reek Treating Provider/Extender: Tito Dine in Treatment: 6 Subjective History of Present Illness (HPI) ADMISSION 09/13/2020 This is a 79 year old woman who arrives with a traumatic wound on the left anterior lower leg. She tells [place] about 5 or 6 weeks ago a Coke can fell out of her fridge and hit her on the anterior leg. She had a fair amount of bleeding however lately she has been trying to heal this with rubbing alcohol and then more recently Neosporin. She is referred here by her primary physician for review of this. Past medical history includes Parkinson's disease, coming up cataract extraction on 10/21, rash of unknown etiology saw dermatology in July 2021, COPD, anxiety and depression, diffuse pain. She is not a diabetic. She does tell us that she has a lot of swelling in her legs over time her skin in the lower extremities is very tender ABI in our clinic was 1.14 11/3; 2-week follow-up. Traumatic wound in the left anterior lower leg in the setting of significant chronic venous insufficiency. She has been using Hydrofera Blue and bordered foam. This is just about closed. 11/17; 2-week follow-up. The traumatic area on the left anterior lower leg is fully epithelialized. She has changes suggestive of chronic venous insufficiency this initially occurred when a Coke can fell out of her fridge and hit her on the anterior leg. In spite of my initial skepticism we managed to heal this without compression she has chronic venous hypertension. We have ordered her Wallie Char wraps stockings however she did not connect with prism this week we have given her instructions for this again. She had a  fall this week and has a skin tear on the left dorsal forearm this is a new wound this week. 12/1; 2-week  follow-up. She has not gotten any of the external compression stockings that we suggested last time. She came in today asking about insurance coverage she did not have an open wound I think that is the issue here. In any case she did not have any stockings on her legs. She cannot get standard over the toes stockings on. She has not been putting anything on the skin tear of the left arm. With that being said this is just about healed on its own. She has not been putting anything on this Objective Constitutional Vitals Time Taken: 12:43 PM, Height: 67 in, Weight: 200 lbs, BMI: 31.3, Temperature: 98.4 F, Pulse: 97 bpm, Respiratory Rate: 16 breaths/min, Blood Pressure: 144/78 mmHg. Integumentary (Hair, Skin) Wound #2 status is Healed - Epithelialized. Original cause of wound was Trauma. The wound is located on the Left,Distal Forearm. The wound measures 0cm length x 0cm width x 0cm depth; 0cm^2 area and 0cm^3 volume. There is Fat Layer (Subcutaneous Tissue) exposed. There is no tunneling or undermining noted. There is a small amount of serosanguineous drainage noted. The wound margin is flat and intact. There is large (67-100%) red granulation within the wound bed. There is no necrotic tissue within the wound bed. Wound #3 status is Healed - Epithelialized. Original cause of wound was Trauma. The wound is located on the Left,Proximal Forearm. The wound measures 0cm length x 0cm width x 0cm depth; 0cm^2 area and 0cm^3 volume. Assessment Active Problems ICD-10 Abrasion, left lower leg, subsequent encounter Non-pressure chronic ulcer of other part of left lower leg limited to breakdown of skin Chronic venous hypertension (idiopathic) without complications of bilateral lower extremity Abrasion of left upper arm, subsequent encounter KATRINKA, HERBISON ([id number]) Plan Discharge From [facility]  Services: Wound #2 Left,Distal Forearm: Discharge from [facility] - treatment complete 1. I think the patient can be discharged from the wound care center 2. I told her to put some topical antibiotic on the small remaining part of her left forearm with a Band-Aid. 3. I went over the external compression stockings with her. As far as I am aware she would have to pay out-of-pocket bilaterally. She was asking if we could backdate the order I will look into that but I do not think so. Electronic Signature(s) Signed: 10/26/2020 12:56:38 PM By: Linton Ham MD Entered By: Linton Ham on 10/25/2020 56:38:93 Hannah Howard ([id number]) -------------------------------------------------------------------------------- SuperBill Details Patient Name: Hannah Howard. Date of Service: 10/25/2020 Medical Record Number: [id number] Patient Account Number: [id number] Date of Birth/Sex: 07/31/1941 (79 y.o. F) Treating RN: Dolan Amen Primary Care Provider: Fulton Reek Other Clinician: Referring Provider: Fulton Reek Treating Provider/Extender: Tito Dine in Treatment: 6 Diagnosis Coding ICD-10 Codes Code Description [phone] Abrasion, left lower leg, subsequent encounter L97.821 Non-pressure chronic ulcer of other part of left lower leg limited to breakdown of skin I87.303 Chronic venous hypertension (idiopathic) without complications of bilateral lower extremity S40.812D Abrasion of left upper arm, subsequent encounter Facility Procedures CPT4 Code: [id number] Description: [id number] - WOUND CARE VISIT-LEV 2 EST PT Modifier: Quantity: 1 Physician Procedures CPT4 Code: 2482500 Description: 37048 - WC PHYS LEVEL 2 - EST PT Modifier: Quantity: 1 CPT4 Code: Description: ICD-10 Diagnosis Description S80.812D Abrasion, left lower leg, subsequent encounter L97.821 Non-pressure chronic ulcer of other part of left lower leg limited to bre I87.303 Chronic venous hypertension  (idiopathic) without complications of  bilater S40.812D Abrasion of left upper arm, subsequent encounter Modifier: akdown of skin al lower extremity Quantity: Electronic Signature(s)  Signed: 10/26/2020 12:56:38 PM By: Linton Ham MD Entered By: Linton Ham on 10/25/2020 13:13:46

## 2020-11-01 ENCOUNTER — Ambulatory Visit: Payer: Medicare Other | Admitting: Internal Medicine

## 2020-11-08 ENCOUNTER — Ambulatory Visit: Payer: Medicare Other | Admitting: Internal Medicine

## 2020-11-29 ENCOUNTER — Other Ambulatory Visit: Payer: Self-pay | Admitting: Dermatology

## 2020-11-29 ENCOUNTER — Telehealth: Payer: Self-pay

## 2020-11-29 DIAGNOSIS — L209 Atopic dermatitis, unspecified: Secondary | ICD-10-CM

## 2020-11-29 NOTE — Telephone Encounter (Signed)
Advised patient that one refill sent in of Singulair 10mg  po QD, but she should follow up with [facility] for additional refills. Patient verbalized understanding.

## 2020-11-29 NOTE — Telephone Encounter (Signed)
As discussed

## 2020-11-29 NOTE — Telephone Encounter (Signed)
Patient does not have a follow up appointment with [place]. We referred her to [facility] and she is being followed by them, but I don't see a follow up scheduled with them either. Would you like for me to refill the Singulair?

## 2020-11-29 NOTE — Telephone Encounter (Signed)
May have 1 rf for Montelukast. Please ask if she has follow up with [facility]. If so, she should get refills from them in future.

## 2020-12-06 ENCOUNTER — Encounter (INDEPENDENT_AMBULATORY_CARE_PROVIDER_SITE_OTHER): Payer: Self-pay

## 2020-12-06 ENCOUNTER — Other Ambulatory Visit: Payer: Self-pay

## 2020-12-06 ENCOUNTER — Inpatient Hospital Stay: Payer: Medicare Other | Attending: Obstetrics and Gynecology | Admitting: Obstetrics and Gynecology

## 2020-12-06 ENCOUNTER — Inpatient Hospital Stay: Payer: Medicare Other

## 2020-12-06 VITALS — BP 144/110 | HR 98 | Temp 97.8°F | Resp 20 | Wt 220.0 lb

## 2020-12-06 DIAGNOSIS — K589 Irritable bowel syndrome without diarrhea: Secondary | ICD-10-CM | POA: Diagnosis not present

## 2020-12-06 DIAGNOSIS — Z7982 Long term (current) use of aspirin: Secondary | ICD-10-CM | POA: Insufficient documentation

## 2020-12-06 DIAGNOSIS — F419 Anxiety disorder, unspecified: Secondary | ICD-10-CM | POA: Insufficient documentation

## 2020-12-06 DIAGNOSIS — G20 Parkinson's disease: Secondary | ICD-10-CM | POA: Insufficient documentation

## 2020-12-06 DIAGNOSIS — E785 Hyperlipidemia, unspecified: Secondary | ICD-10-CM | POA: Diagnosis not present

## 2020-12-06 DIAGNOSIS — K219 Gastro-esophageal reflux disease without esophagitis: Secondary | ICD-10-CM | POA: Diagnosis not present

## 2020-12-06 DIAGNOSIS — Z87891 Personal history of nicotine dependence: Secondary | ICD-10-CM | POA: Diagnosis not present

## 2020-12-06 DIAGNOSIS — F32A Depression, unspecified: Secondary | ICD-10-CM | POA: Insufficient documentation

## 2020-12-06 DIAGNOSIS — R102 Pelvic and perineal pain: Secondary | ICD-10-CM

## 2020-12-06 DIAGNOSIS — C519 Malignant neoplasm of vulva, unspecified: Secondary | ICD-10-CM | POA: Insufficient documentation

## 2020-12-06 DIAGNOSIS — E669 Obesity, unspecified: Secondary | ICD-10-CM | POA: Diagnosis not present

## 2020-12-06 DIAGNOSIS — I10 Essential (primary) hypertension: Secondary | ICD-10-CM | POA: Diagnosis not present

## 2020-12-06 DIAGNOSIS — J449 Chronic obstructive pulmonary disease, unspecified: Secondary | ICD-10-CM | POA: Diagnosis not present

## 2020-12-06 DIAGNOSIS — Z79899 Other long term (current) drug therapy: Secondary | ICD-10-CM | POA: Diagnosis not present

## 2020-12-06 DIAGNOSIS — Z8249 Family history of ischemic heart disease and other diseases of the circulatory system: Secondary | ICD-10-CM | POA: Diagnosis not present

## 2020-12-06 DIAGNOSIS — Z8042 Family history of malignant neoplasm of prostate: Secondary | ICD-10-CM | POA: Insufficient documentation

## 2020-12-06 DIAGNOSIS — G893 Neoplasm related pain (acute) (chronic): Secondary | ICD-10-CM | POA: Insufficient documentation

## 2020-12-06 LAB — COMPREHENSIVE METABOLIC PANEL
ALT: 9 U/L (ref 0–44)
AST: 21 U/L (ref 15–41)
Albumin: 4 g/dL (ref 3.5–5.0)
Alkaline Phosphatase: 97 U/L (ref 38–126)
Anion gap: 10 (ref 5–15)
BUN: 15 mg/dL (ref 8–23)
CO2: 24 mmol/L (ref 22–32)
Calcium: 9.1 mg/dL (ref 8.9–10.3)
Chloride: 102 mmol/L (ref 98–111)
Creatinine, Ser: 1.32 mg/dL — ABNORMAL HIGH (ref 0.44–1.00)
GFR, Estimated: 41 mL/min — ABNORMAL LOW (ref >60)
Glucose, Bld: 161 mg/dL — ABNORMAL HIGH (ref 70–99)
Potassium: 3.8 mmol/L (ref 3.5–5.1)
Sodium: 136 mmol/L (ref 135–145)
Total Bilirubin: 0.3 mg/dL (ref 0.3–1.2)
Total Protein: 6.9 g/dL (ref 6.5–8.1)

## 2020-12-06 LAB — CBC WITH DIFFERENTIAL/PLATELET
Abs Immature Granulocytes: 0.03 10*3/uL (ref 0.00–0.07)
Basophils Absolute: 0 10*3/uL (ref 0.0–0.1)
Basophils Relative: 0 %
Eosinophils Absolute: 0.4 10*3/uL (ref 0.0–0.5)
Eosinophils Relative: 4 %
HCT: 33.4 % — ABNORMAL LOW (ref 36.0–46.0)
Hemoglobin: 11.1 g/dL — ABNORMAL LOW (ref 12.0–15.0)
Immature Granulocytes: 0 %
Lymphocytes Relative: 9 %
Lymphs Abs: 0.9 10*3/uL (ref 0.7–4.0)
MCH: 30.8 pg (ref 26.0–34.0)
MCHC: 33.2 g/dL (ref 30.0–36.0)
MCV: 92.8 fL (ref 80.0–100.0)
Monocytes Absolute: 0.5 10*3/uL (ref 0.1–1.0)
Monocytes Relative: 4 %
Neutro Abs: 9.1 10*3/uL — ABNORMAL HIGH (ref 1.7–7.7)
Neutrophils Relative %: 83 %
Platelets: 473 10*3/uL — ABNORMAL HIGH (ref 150–400)
RBC: 3.6 MIL/uL — ABNORMAL LOW (ref 3.87–5.11)
RDW: 13.7 % (ref 11.5–15.5)
WBC: 11 10*3/uL — ABNORMAL HIGH (ref 4.0–10.5)
nRBC: 0 % (ref 0.0–0.2)

## 2020-12-06 LAB — HIV ANTIBODY (ROUTINE TESTING W REFLEX): HIV Screen 4th Generation wRfx: NONREACTIVE

## 2020-12-06 NOTE — Patient Instructions (Signed)
Vulvar Cancer Vulvar cancer is a type of cancer that develops on the outside part of the female genitals (vulva). Females most often get vulvar cancer on the inside of the labia. What are the causes? This condition may be caused by:  The human papillomavirus (HPV). This virus is passed from person to person through sexual activity.  A harmful change (mutation) in your DNA. Mutations in DNA that occur with vulvar cancer are usually not inherited. They can develop due to exposure to cancer-causing chemicals, such as those found in tobacco products, or for unknown reasons. What increases the risk? You are more likely to develop this condition if you:  Have HPV.  Are older than 79 years of age.  Have or have had cervical cancer.  Had a Pap test that was not normal.  Have a skin problem called lichen sclerosus.  Have melanoma or an abnormal mole.  Have HIV (human immunodeficiency virus) or AIDS (acquired immunodeficiency syndrome).  Smoke. What are the signs or symptoms? Symptoms of this condition include:  A rash, sores, lumps, ulcers, or warts on the vulva.  More of a red or white color to the vulva than usual.  Pain, tenderness, or itchiness in the vulva.  Pelvic pain when urinating or having sex.  Bleeding.   How is this diagnosed? This condition can be diagnosed with:  Medical history and physical exam. This includes a pelvic exam.  A procedure in which the vulva is examined with a magnifying device (colposcope). This makes it easier for the health care provider to see if there are problems in the area.  A procedure in which a sample of tissue is taken from the vulva and examined under a microscope (vulvar biopsy). Once the condition is diagnosed, tests will be done to determine how far the cancer has spread. This is called staging the cancer. Staging may involve imaging tests, such as:  A chest xray.  A CT scan.  An MRI scan.  A PET scan. How is this  treated? You may be referred to a specialist in women's reproductive health (gynecologist)or women's reproductive cancers (gynecologic oncologist) for treatment. Treatment for this condition may include:  Surgery to help remove cancer from your body.  Radiation therapy inside or outside your body. This treatment uses X-rays to kill cancer cells.  Chemotherapy to keep cancer cells from growing. This treatment may be given through an IV, as a pill, or as a cream.  Biologic therapy treatment to use the body's own immune system to fight the cancer. Follow these instructions at home: Learn about your cancer  Learn as much as you can about your cancer and your treatment options.  Make sure you understand the side effects of treatment and take proper steps to protect yourself. ? For example, if chemotherapy is to be used as part of your treatment plan, know the signs and symptoms of infection and steps you should take to prevent infection. Lifestyle  Take good care of your overall health. A healthy lifestyle may help you recover more quickly.  Follow the diet your health care provider recommends for you, and make healthy food choices.  Do not use any products that contain nicotine or tobacco, such as cigarettes, e-cigarettes, and chewing tobacco. If you need help quitting, ask your health care provider.  Limit alcohol intake to no more than 1 drink a day for women who are not pregnant. One drink equals one 12 oz bottle of beer (355 mL), one 5 oz glass  of wine (148 mL), or one 1 oz glass of hard liquor (44 mL). Activity  Get regular exercise. Aim for 30 minutes of moderate-intensity activity 5 times a week. Examples of moderate-intensity activity include walking and yoga. Talk with your health care provider before starting any exercise routine. This is important.  Engage in sexual activities only as told by your health care provider. Ask your health care provider what sexual activities are  safe for you.  Return to your normal activities as told by your health care provider. Ask your health care provider what activities are safe for you. General instructions  Follow your care plan as directed by your health care provider.  Take over-the-counter and prescription medicines as told by your health care provider.  Keep all follow-up visits as told by your health care provider. This is important. How is this prevented? You cannot prevent this condition completely, but you can lower your risk of developing this cancer by:  Getting the HPV vaccine.  Avoiding infection with HPV and HIV. To do this: ? Limit your number of sexual partners. ? Use protection, such as condoms, during all sexual activity. Note that condoms cannot completely protect you from HPV.  Not smoking. If you need help quitting, ask your health care provider. Contact a health care provider if you:  Have bleeding or discharge from your vulva.  Have new or increasing pain or pressure near your vulva.  Have new or unusual symptoms.  Feel depressed or sad. Get help right away if you:  Feel dizzy or light-headed.  Have a fever or chills.  Have bleeding that does not stop. Summary  Vulvar cancer is a type of cancer that develops in the outside part of the female genitals (vulva).  Having HPV may increase your risk of developing this condition.  If you are diagnosed with vulvar cancer, your doctor may recommend surgery to remove the cancer combined with other treatments such as chemotherapy or radiation.  Follow your care plan as directed by your health care provider.  Contact your health care provider if you experience any unusual symptoms or other complications related to your condition. This information is not intended to replace advice given to you by your health care provider. Make sure you discuss any questions you have with your health care provider. Document Revised: 10/26/2019 Document  Reviewed: 10/26/2019 Elsevier Patient Education  2021 [employer].

## 2020-12-06 NOTE — Progress Notes (Signed)
Gynecologic Oncology Consult Visit   Referring Provider: Boykin Nearing, MD [address] [facility] [place],  [place] [address] [phone]  Chief Concern: Vulvar cancer  Subjective:  Hannah Howard is a 80 y.o. G48P2 female who is seen in consultation from Dr. Ouida Sills for vulvar cancer.   She was referred from Dr Doy Hutching for labial swelling to Dr. Ouida Sills. She has had this complaint for sometime off/ on , but over the last ~2 months swelling and pain has worsened . Difficulty with sitting due to pain . Empirically tx with abx and OTC yeast meds  She was seen by Dr. Ouida Sills on 11/27/2020 and exam revealed external genitalia: right labia minora lesion that was very edematous with necrotic edges. 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   The patient reports that she quit smoking about 22 years ago. She has a 20.00 pack-year smoking history.   She has multiple medical problems including Parkinson's disease and COPD. She uses a walker at home and arrived to clinic in a wheel chair. She is very sedentary and does not think she can walk a block. She does not go up stairs. She is having considerable pain from the vulvar cancer.     Problem List: Patient Active Problem List   Diagnosis Date Noted  . Primary osteoarthritis of right hip 04/02/2020  . Pain due to onychomycosis of toenails of both feet 12/09/2019  . Chronic generalized pain 02/05/2018  . Atypical chest pain 09/05/2017  . Dizzy spells 09/05/2017  . SOB (shortness of breath) 09/05/2017  . 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/23/2016  . Stiffness of shoulder joint, left 04/09/2016  . Obesity  (BMI 30-39.9) 01/24/2016  . Atrophic vaginitis 07/03/2015  . Urinary frequency 07/03/2015  . Urge incontinence 07/03/2015  . Anxiety and depression 07/08/2014  . BP (high blood pressure) 07/08/2014  . Hyperlipidemia 07/08/2014  . Idiopathic Parkinson's disease ([place]) 07/08/2014  . Parkinson disease ([place]) 07/08/2014  . HTN (hypertension) 07/08/2014  . Disc disease, degenerative, lumbar or lumbosacral 04/04/2014  . Neuritis or radiculitis due to rupture of lumbar intervertebral disc 04/04/2014  . Lumbar radiculitis 04/04/2014    Past Medical History: Past Medical History:  Diagnosis Date  . Anxiety   . Arthritis   . Complication of anesthesia    woke during on of her knee operations  . COPD (chronic obstructive pulmonary disease) ([place])   . DDD (degenerative disc disease), lumbar   . Depression   . Diverticulosis   . GERD (gastroesophageal reflux disease)   . Headache    23-3x/month  . History of colonic polyps   . Hyperlipidemia   . Hypertension   . Irritable bowel syndrome   . Migraine headache   . Motion sickness    cars  . Parkinson's disease ([place])   . Parkinsonism ([place])   . Vertigo    1 episode, recently  . Vulvar cancer ([place])   . Wears dentures    partial upper      Past Surgical History: Past Surgical History:  Procedure Laterality Date  . BREAST BIOPSY Left 1973   neg  . CATARACT EXTRACTION W/PHACO Right 09/19/2020   Procedure: CATARACT EXTRACTION PHACO AND INTRAOCULAR LENS PLACEMENT (IOC) RIGHT;  Surgeon: George Ina,  Gwyndolyn Saxon, MD;  Location: [facility];  Service: Ophthalmology;  Laterality: Right;  13.20 1:12.3  . CATARACT EXTRACTION W/PHACO Left 10/10/2020   Procedure: CATARACT EXTRACTION PHACO AND INTRAOCULAR LENS PLACEMENT (IOC) LEFT 8.86 00:51.5;  Surgeon: Birder Robson, MD;  Location: [place];  Service: Ophthalmology;  Laterality: Left;  . CHOLECYSTECTOMY    . COLONOSCOPY WITH PROPOFOL N/A 04/07/2020   Procedure: COLONOSCOPY  WITH PROPOFOL;  Surgeon: Robert Bellow, MD;  Location: [facility];  Service: Endoscopy;  Laterality: N/A;  . KNEE ARTHROSCOPY Right 2010  . KNEE ARTHROSCOPY Left 2012  . TOTAL KNEE ARTHROPLASTY Right 10/18/2009  . TOTAL KNEE ARTHROPLASTY Left 10/08/2010  . TUBAL LIGATION         Past Gynecologic History:  Menarche: 17 Last Menstrual Period: unknown; postmenopausal History of Abnormal pap: unknown Last pap: unknown   OB History  Gravida Para Term Preterm AB Living  2 2          SAB IAB Ectopic Multiple Live Births               # Outcome Date GA Lbr Len/2nd Weight Sex Delivery Anes PTL Lv  2 Para           1 Para             Family History: Family History  Problem Relation Age of Onset  . Hematuria Other   . Kidney failure Other   . Prostate cancer Other   . Breast cancer Neg Hx     Social History: Social History   Socioeconomic History  . Marital status: Divorced    Spouse name: Not on file  . Number of children: Not on file  . Years of education: Not on file  . Highest education level: Not on file  Occupational History  . Not on file  Tobacco Use  . Smoking status: Former Smoker    Types: Cigarettes    Quit date: 07/02/2000    Years since quitting: 20.4  . Smokeless tobacco: Never Used  Vaping Use  . Vaping Use: Never used  Substance and Sexual Activity  . Alcohol use: No    Alcohol/week: 0.0 standard drinks  . Drug use: No  . Sexual activity: Not on file  Other Topics Concern  . Not on file  Social History Narrative  . Not on file   Social Determinants of Health   Financial Resource Strain: Not on file  Food Insecurity: Not on file  Transportation Needs: Not on file  Physical Activity: Not on file  Stress: Not on file  Social Connections: Not on file  Intimate Partner Violence: Not on file    Allergies: Allergies  Allergen Reactions  . Amoxicillin Rash  . Amoxicillin-Pot Clavulanate Rash  . Bronopol Rash    Patch test proven  Allergic Contact dermatitis to Bronopol in patients soap products Patch test positive allergic contact dermatitis to Bronopol in patients soap products    Current Medications: Current Outpatient Medications  Medication Sig Dispense Refill  . aspirin EC 81 MG tablet Take by mouth.    . carbidopa-levodopa (SINEMET CR) 50-200 MG tablet Take 1 tablet by mouth at bedtime.    . carbidopa-levodopa (SINEMET IR) 25-250 MG tablet Take 1 tablet by mouth 3 (three) times daily.    . celecoxib (CELEBREX) 200 MG capsule TAKE 1 CAPSULE BY MOUTH EVERY DAY    . clotrimazole-betamethasone (LOTRISONE) cream Apply topically.    Marland Kitchen doxepin (SINEQUAN) 25 MG capsule Take 25  mg by mouth at bedtime.    . folic acid (FOLVITE) 1 MG tablet Take 1 mg by mouth daily.    Marland Kitchen gabapentin (NEURONTIN) 100 MG capsule Take 100 mg by mouth daily.    Marland Kitchen HYDROcodone-acetaminophen (NORCO/VICODIN) 5-325 MG tablet Take 1 tablet by mouth every 6 (six) hours as needed for pain.    . hydrOXYzine (ATARAX/VISTARIL) 50 MG tablet TAKE ONE TABLET 3 TIMES DAILY AS NEEDED FOR ITCHING    . ketoconazole (NIZORAL) 2 % cream     . lansoprazole (PREVACID) 30 MG capsule TAKE 1 CAPSULE BY MOUTH EVERY DAY    . levocetirizine (XYZAL) 5 MG tablet Take 5 mg by mouth at bedtime.    . lidocaine (XYLOCAINE) 2 % jelly Apply topically.    . meclizine (ANTIVERT) 25 MG tablet Take 25 mg by mouth 3 (three) times daily as needed for dizziness.    . montelukast (SINGULAIR) 10 MG tablet TAKE ONE TABLET EVERY DAY 30 tablet 0  . Multiple Vitamin (MULTIVITAMIN) capsule Take by mouth.    . Polyethylene Glycol 3350 (MIRALAX PO) Take by mouth as needed.    . sertraline (ZOLOFT) 100 MG tablet TAKE ONE TABLET BY MOUTH EVERY DAY    . solifenacin (VESICARE) 10 MG tablet Take 10 mg by mouth daily.    . traZODone (DESYREL) 150 MG tablet TAKE ONE TABLET BY MOUTH AT BEDTIME    . betamethasone dipropionate 0.05 % cream Apply topically.    . clobetasol cream (TEMOVATE) 0.05 % APPLY  TOPICALLY TWICE DAILY AS DIRECTED (Patient not taking: Reported on 12/06/2020)    . lidocaine (XYLOCAINE) 2 % jelly Apply topically as needed.    . methotrexate (RHEUMATREX) 2.5 MG tablet Take by mouth.    . solifenacin (VESICARE) 10 MG tablet SMARTSIG:Tablet(s) By Mouth    . triamcinolone cream (KENALOG) 0.1 % Apply topically 2 (two) times daily. Avoid face, groin, underarms. (Patient not taking: Reported on 12/06/2020) 454 g 1   Current Facility-Administered Medications  Medication Dose Route Frequency Provider Last Rate Last Admin  . Dupilumab SOPN 300 mg  300 mg Subcutaneous Q14 Days Ralene Bathe, MD   300 mg at 02/15/20 1230    Review of Systems General: negative for fevers, changes in weight or night sweats Skin: negative for changes in moles or sores or rash Eyes: negative for changes in vision HEENT: negative for change in hearing, tinnitus, voice changes Pulmonary: dyspnea o/w negative for orthopnea, productive cough, wheezing Cardiac: negative for palpitations, pain Gastrointestinal:nausea and constipation o/w negative for vomiting, diarrhea, hematemesis, hematochezia Genitourinary/Sexual: positive for urinary incontinence, frequency, pain and incomplete bladder emptying o/w negative for hematuria, Ob/Gyn:  positive for abnormal bleeding, or pain Musculoskeletal:  joint pain and back pain Hematology: negative for easy bruising, abnormal bleeding Neurologic/Psych: negative for headaches, seizures, paralysis, weakness, numbness   Objective:  Physical Examination:  BP (!) 144/110   Pulse 98   Temp 97.8 F (36.6 C)   Resp 20   Wt 220 lb (99.8 kg)   LMP  (LMP Unknown)   SpO2 100%   BMI 35.51 kg/m    Pain 10/10  ECOG Performance Status: 2-3  General appearance: alert, cooperative and appears older than stated age and frail appearing HEENT:  PERRL, atraumatic and normocephalic NODES:  No cervical, supraclavicular, axillary, or left inguinal lymphadenopathy palpated.  She has a palpable nontender 2 cm right inguinal node.  LUNGS:  Clear to auscultation bilaterally.   HEART:  Regular rate and rhythm.  ABDOMEN:  Soft, nontenderm nondistended. No masses, hernias, or ascites.  MSK:  No focal spinal tenderness to palpation. Full range of motion bilaterally in the upper extremities. EXTREMITIES:  No peripheral edema.   NEURO:  Nonfocal. Well oriented.  Appropriate affect.  Pelvic:exam chaperoned by CMA EGBUS: large ulcerative and fungating 7 x 6 cm vulvar tumor involving the right labium, mid vulva, and urethra and encroaching the vaginal margin. There appears to be another focus of vulvar tumor involving the left labium majora. Cervix: unable to perform a speculum exam due to patient discomfort. Unable to palpate the cervix.  Vagina: unable to perform a speculum exam due to patient discomfort. On palpation no lesions appreciated.  Uterus: Unable to palpate due to habitus. Adnexa: no palpable masses Rectovaginal: confirmatory and negative for any perianal or rectal lesions.    Lab Review Labs on site today: pending  Radiologic Imaging: PET/CT pending    Assessment:  Hannah Howard is a 80 y.o. female diagnosed with at least stage III SQUAMOUS CELL, WELL DIFFERENTIATED vulvar cancer.   Symptomatic vulvar pain due to malignancy.   Medical co-morbidities complicating care: COPD, Parkinson's disease, Frailty. Body mass index is 35.51 kg/m.   Plan:   Problem List Items Addressed This Visit   None   Visit Diagnoses    Vulvar cancer ([place])    -  Primary   Relevant Medications   methotrexate (RHEUMATREX) 2.5 MG tablet   Other Relevant Orders   Comprehensive metabolic panel   CBC with Differential/Platelet   HIV Antibody (routine testing w rflx)   NM PET Image Initial (PI) Skull Base To Thigh   Ambulatory referral to Radiation Oncology   Ambulatory referral to Oncology   Vulvar pain       Squamous cell carcinoma of vulva (HCC)       Relevant  Medications   methotrexate (RHEUMATREX) 2.5 MG tablet   Other Relevant Orders   HIV Antibody (routine testing w rflx)   Ambulatory referral to Radiation Oncology   Ambulatory referral to Oncology   Ambulatory Referral to Palliative Care      We discussed options for management. Given extensive stage III disease I recommended  concurrent chemotherapy and radiation. I am not sure if she is a candidate for chemotherapy but we defer to Medical Oncology. Referral to Radiation Oncology placed as well and they will see her this Friday.   CBC, CMP, HIV, and PET/CT ordered.   We discussed pain management and recommended increasing hydrocodone and referred to Palliative care. She is aware that her pain symptoms may worsen initially when radiation is started and then will improve.   Follow up in ~ 3 months to assess current status.   The patient's diagnosis, an outline of the further diagnostic and laboratory studies which will be required, the recommendation, and alternatives were discussed.  All questions were answered to the patient's satisfaction.  A total of at least 80 minutes were spent with the patient/family today; >50% was spent in education, counseling and coordination of care for vulvar cancer.    Naresh Althaus Gaetana Michaelis, MD    CC:  Boykin Nearing, MD [address] [facility] [place],  [place] [address] [phone]

## 2020-12-08 ENCOUNTER — Other Ambulatory Visit: Payer: Self-pay | Admitting: *Deleted

## 2020-12-08 ENCOUNTER — Other Ambulatory Visit: Payer: Self-pay

## 2020-12-08 ENCOUNTER — Encounter: Payer: Self-pay | Admitting: Radiation Oncology

## 2020-12-08 ENCOUNTER — Ambulatory Visit
Admission: RE | Admit: 2020-12-08 | Discharge: 2020-12-08 | Disposition: A | Discharge status: Home / Self Care | Payer: Medicare Other | Source: Ambulatory Visit | Attending: Radiation Oncology | Admitting: Radiation Oncology

## 2020-12-08 VITALS — BP 136/64 | HR 94 | Temp 97.5°F | Resp 16

## 2020-12-08 DIAGNOSIS — M5136 Other intervertebral disc degeneration, lumbar region: Secondary | ICD-10-CM | POA: Insufficient documentation

## 2020-12-08 DIAGNOSIS — M129 Arthropathy, unspecified: Secondary | ICD-10-CM | POA: Diagnosis not present

## 2020-12-08 DIAGNOSIS — E785 Hyperlipidemia, unspecified: Secondary | ICD-10-CM | POA: Diagnosis not present

## 2020-12-08 DIAGNOSIS — Z87891 Personal history of nicotine dependence: Secondary | ICD-10-CM | POA: Insufficient documentation

## 2020-12-08 DIAGNOSIS — C519 Malignant neoplasm of vulva, unspecified: Secondary | ICD-10-CM | POA: Diagnosis present

## 2020-12-08 DIAGNOSIS — G20 Parkinson's disease: Secondary | ICD-10-CM | POA: Diagnosis not present

## 2020-12-08 DIAGNOSIS — I10 Essential (primary) hypertension: Secondary | ICD-10-CM | POA: Diagnosis not present

## 2020-12-08 DIAGNOSIS — Z79899 Other long term (current) drug therapy: Secondary | ICD-10-CM | POA: Diagnosis not present

## 2020-12-08 DIAGNOSIS — Z7982 Long term (current) use of aspirin: Secondary | ICD-10-CM | POA: Diagnosis not present

## 2020-12-08 DIAGNOSIS — K219 Gastro-esophageal reflux disease without esophagitis: Secondary | ICD-10-CM | POA: Insufficient documentation

## 2020-12-08 DIAGNOSIS — F418 Other specified anxiety disorders: Secondary | ICD-10-CM | POA: Diagnosis not present

## 2020-12-08 DIAGNOSIS — Z86010 Personal history of colonic polyps: Secondary | ICD-10-CM | POA: Diagnosis not present

## 2020-12-08 DIAGNOSIS — J449 Chronic obstructive pulmonary disease, unspecified: Secondary | ICD-10-CM | POA: Insufficient documentation

## 2020-12-08 NOTE — Consult Note (Signed)
NEW PATIENT EVALUATION  Name: Hannah Howard  MRN: [id number]  Date:   12/08/2020     DOB: 21-Jun-1941   This 80 y.o. female patient presents to the clinic for initial evaluation of stage III squamous cell carcinoma of the vulva.  REFERRING PHYSICIAN: Idelle Crouch, MD  CHIEF COMPLAINT:  Chief Complaint  Patient presents with  . Cancer    Vulva cancer  consultation    DIAGNOSIS: The encounter diagnosis was Vulva cancer ([place]).   PREVIOUS INVESTIGATIONS:  Pathology report reviewed Clinical notes reviewed PET CT scan has been ordered  HPI: Patient is a 80 year old female with multiple comorbidities including Parkinson disease COPD morbid obesity.  She has presented with a progressive pain in the vulvar region and was found on examination by Dr. Ouida Sills to have a bulky lesion of the right labia biopsy positive for well-differentiated squamous cell carcinoma.  On examination she was found to have a 7 x 6 cm vulvar mass and involving the right labium mid vulva urethra and encroaching on the vaginal margin.  There also appear to be a focus of tumor involving the left labia majora.  PET CT scan has been ordered.  She is currently on narcotic analgesics for her pain.  Based on her multiple medical comorbidities not thought to be a surgical candidate is now referred to radiation oncology for consideration of treatment.  Dr. Theora Gianotti has recommended concurrent chemoradiation therapy.  PLANNED TREATMENT REGIMEN: Concurrent chemoradiation  PAST MEDICAL HISTORY:  has a past medical history of Anxiety, Arthritis, Complication of anesthesia, COPD (chronic obstructive pulmonary disease) ([place]), DDD (degenerative disc disease), lumbar, Depression, Diverticulosis, GERD (gastroesophageal reflux disease), Headache, History of colonic polyps, Hyperlipidemia, Hypertension, Irritable bowel syndrome, Migraine headache, Motion sickness, Parkinson's disease ([place]), Parkinsonism ([place]), Vertigo, Vulvar cancer  ([place]), and Wears dentures.    PAST SURGICAL HISTORY:  Past Surgical History:  Procedure Laterality Date  . BREAST BIOPSY Left 1973   neg  . CATARACT EXTRACTION W/PHACO Right 09/19/2020   Procedure: CATARACT EXTRACTION PHACO AND INTRAOCULAR LENS PLACEMENT ([place]) RIGHT;  Surgeon: Birder Robson, MD;  Location: [place];  Service: Ophthalmology;  Laterality: Right;  13.20 1:12.3  . CATARACT EXTRACTION W/PHACO Left 10/10/2020   Procedure: CATARACT EXTRACTION PHACO AND INTRAOCULAR LENS PLACEMENT (IOC) LEFT 8.86 00:51.5;  Surgeon: Birder Robson, MD;  Location: [place];  Service: Ophthalmology;  Laterality: Left;  . CHOLECYSTECTOMY    . COLONOSCOPY WITH PROPOFOL N/A 04/07/2020   Procedure: COLONOSCOPY WITH PROPOFOL;  Surgeon: Robert Bellow, MD;  Location: [facility];  Service: Endoscopy;  Laterality: N/A;  . KNEE ARTHROSCOPY Right 2010  . KNEE ARTHROSCOPY Left 2012  . TOTAL KNEE ARTHROPLASTY Right 10/18/2009  . TOTAL KNEE ARTHROPLASTY Left 10/08/2010  . TUBAL LIGATION      FAMILY HISTORY: family history includes Hematuria in an other family member; Kidney failure in an other family member; Prostate cancer in an other family member.  SOCIAL HISTORY:  reports that she quit smoking about 20 years ago. Her smoking use included cigarettes. She has never used smokeless tobacco. She reports that she does not drink alcohol and does not use drugs.  ALLERGIES: Amoxicillin, Amoxicillin-pot clavulanate, and Bronopol  MEDICATIONS:  Current Outpatient Medications  Medication Sig Dispense Refill  . aspirin EC 81 MG tablet Take by mouth.    . betamethasone dipropionate 0.05 % cream Apply topically.    . carbidopa-levodopa (SINEMET CR) 50-200 MG tablet Take 1 tablet by mouth at bedtime.    Marland Kitchen  carbidopa-levodopa (SINEMET IR) 25-250 MG tablet Take 1 tablet by mouth 3 (three) times daily.    . celecoxib (CELEBREX) 200 MG capsule TAKE 1 CAPSULE BY MOUTH EVERY DAY    .  clotrimazole-betamethasone (LOTRISONE) cream Apply topically.    Marland Kitchen doxepin (SINEQUAN) 25 MG capsule Take 25 mg by mouth at bedtime.    . folic acid (FOLVITE) 1 MG tablet Take 1 mg by mouth daily.    Marland Kitchen gabapentin (NEURONTIN) 100 MG capsule Take 100 mg by mouth daily.    Marland Kitchen HYDROcodone-acetaminophen (NORCO/VICODIN) 5-325 MG tablet Take 1 tablet by mouth every 6 (six) hours as needed for pain.    . hydrOXYzine (ATARAX/VISTARIL) 50 MG tablet TAKE ONE TABLET 3 TIMES DAILY AS NEEDED FOR ITCHING    . ketoconazole (NIZORAL) 2 % cream     . lansoprazole (PREVACID) 30 MG capsule TAKE 1 CAPSULE BY MOUTH EVERY DAY    . levocetirizine (XYZAL) 5 MG tablet Take 5 mg by mouth at bedtime.    . lidocaine (XYLOCAINE) 2 % jelly Apply topically.    . lidocaine (XYLOCAINE) 2 % jelly Apply topically as needed.    . clobetasol cream (TEMOVATE) 0.05 % APPLY TOPICALLY TWICE DAILY AS DIRECTED (Patient not taking: No sig reported)    . meclizine (ANTIVERT) 25 MG tablet Take 25 mg by mouth 3 (three) times daily as needed for dizziness.    . methotrexate (RHEUMATREX) 2.5 MG tablet Take by mouth.    . montelukast (SINGULAIR) 10 MG tablet TAKE ONE TABLET EVERY DAY 30 tablet 0  . Multiple Vitamin (MULTIVITAMIN) capsule Take by mouth.    . Polyethylene Glycol 3350 (MIRALAX PO) Take by mouth as needed.    . sertraline (ZOLOFT) 100 MG tablet TAKE ONE TABLET BY MOUTH EVERY DAY    . solifenacin (VESICARE) 10 MG tablet Take 10 mg by mouth daily.    . solifenacin (VESICARE) 10 MG tablet SMARTSIG:Tablet(s) By Mouth    . traZODone (DESYREL) 150 MG tablet TAKE ONE TABLET BY MOUTH AT BEDTIME    . triamcinolone cream (KENALOG) 0.1 % Apply topically 2 (two) times daily. Avoid face, groin, underarms. (Patient not taking: No sig reported) 454 g 1   Current Facility-Administered Medications  Medication Dose Route Frequency Provider Last Rate Last Admin  . Dupilumab SOPN 300 mg  300 mg Subcutaneous Q14 Days Ralene Bathe, MD   300 mg at  02/15/20 1230    ECOG PERFORMANCE STATUS:  2 - Symptomatic, <50% confined to bed  REVIEW OF SYSTEMS: Patient denies any weight loss, fatigue, weakness, fever, chills or night sweats. Patient denies any loss of vision, blurred vision. Patient denies any ringing  of the ears or hearing loss. No irregular heartbeat. Patient denies heart murmur or history of fainting. Patient denies any chest pain or pain radiating to her upper extremities. Patient denies any shortness of breath, difficulty breathing at night, cough or hemoptysis. Patient denies any swelling in the lower legs. Patient denies any nausea vomiting, vomiting of blood, or coffee ground material in the vomitus. Patient denies any stomach pain. Patient states has had normal bowel movements no significant constipation or diarrhea. Patient denies any dysuria, hematuria or significant nocturia. Patient denies any problems walking, swelling in the joints or loss of balance. Patient denies any skin changes, loss of hair or loss of weight. Patient denies any excessive worrying or anxiety or significant depression. Patient denies any problems with insomnia. Patient denies excessive thirst, polyuria, polydipsia. Patient denies any swollen  glands, patient denies easy bruising or easy bleeding. Patient denies any recent infections, allergies or URI. Patient "s visual fields have not changed significantly in recent time.   PHYSICAL EXAM: BP 136/64   Pulse 94   Temp (!) 97.5 F (36.4 C)   Resp 16   LMP  (LMP Unknown)   SpO2 98%  There is a bulky slight necrotic mass of the vulva patient is unable to undergo speculum examination based on pain well-developed well-nourished patient in NAD. HEENT reveals PERLA, EOMI, discs not visualized.  Oral cavity is clear. No oral mucosal lesions are identified. Neck is clear without evidence of cervical or supraclavicular adenopathy. Lungs are clear to A&P. Cardiac examination is essentially unremarkable with regular  rate and rhythm without murmur rub or thrill. Abdomen is benign with no organomegaly or masses noted. Motor sensory and DTR levels are equal and symmetric in the upper and lower extremities. Cranial nerves II through XII are grossly intact. Proprioception is intact. No peripheral adenopathy or edema is identified. No motor or sensory levels are noted. Crude visual fields are within normal range.  LABORATORY DATA: Pathology reports reviewed    RADIOLOGY RESULTS: PET CT scan has been ordered.   IMPRESSION: Stage III squamous cell carcinoma of the vulva and 80 year old female with multiple medical comorbidities.  PLAN: At this time elected review her PET CT scan for final staging and make by choice as to dose at that time.  Risks and benefits of concurrent chemoradiation were reviewed with the patient.  Side effects including increased lower urinary tract symptoms diarrhea fatigue and significant skin reaction all were reviewed in detail with the patient.  I have also schedule patient be seen by medical oncology for consideration of concurrent chemotherapy.  I have personally set up and ordered CT simulation after her PET CT scan.  We will make final recommendations based on that scan.  I would like to take this opportunity to thank you for allowing me to participate in the care of your patient.Noreene Filbert, MD

## 2020-12-13 ENCOUNTER — Ambulatory Visit
Admission: RE | Admit: 2020-12-13 | Discharge: 2020-12-13 | Disposition: A | Discharge status: Home / Self Care | Payer: Medicare Other | Source: Ambulatory Visit | Attending: Nurse Practitioner | Admitting: Nurse Practitioner

## 2020-12-13 ENCOUNTER — Other Ambulatory Visit: Payer: Self-pay

## 2020-12-13 DIAGNOSIS — C519 Malignant neoplasm of vulva, unspecified: Secondary | ICD-10-CM

## 2020-12-13 DIAGNOSIS — C518 Malignant neoplasm of overlapping sites of vulva: Secondary | ICD-10-CM | POA: Diagnosis not present

## 2020-12-13 DIAGNOSIS — C510 Malignant neoplasm of labium majus: Secondary | ICD-10-CM | POA: Insufficient documentation

## 2020-12-13 LAB — GLUCOSE, CAPILLARY: Glucose-Capillary: 75 mg/dL (ref 70–99)

## 2020-12-13 MED ORDER — FLUDEOXYGLUCOSE F - 18 (FDG) INJECTION
11.4000 | Freq: Once | INTRAVENOUS | Status: AC | PRN
  Administered 2020-12-13: 11.97 via INTRAVENOUS

## 2020-12-15 ENCOUNTER — Inpatient Hospital Stay: Payer: Medicare Other

## 2020-12-15 ENCOUNTER — Encounter: Payer: Self-pay | Admitting: Oncology

## 2020-12-15 ENCOUNTER — Other Ambulatory Visit: Payer: Self-pay

## 2020-12-15 ENCOUNTER — Ambulatory Visit
Admission: RE | Admit: 2020-12-15 | Discharge: 2020-12-15 | Disposition: A | Discharge status: Home / Self Care | Payer: Medicare Other | Source: Ambulatory Visit | Attending: Radiation Oncology | Admitting: Radiation Oncology

## 2020-12-15 ENCOUNTER — Other Ambulatory Visit: Payer: Self-pay | Admitting: *Deleted

## 2020-12-15 ENCOUNTER — Inpatient Hospital Stay: Payer: Medicare Other | Admitting: Hospice and Palliative Medicine

## 2020-12-15 ENCOUNTER — Inpatient Hospital Stay (HOSPITAL_BASED_OUTPATIENT_CLINIC_OR_DEPARTMENT_OTHER): Payer: Medicare Other | Admitting: Oncology

## 2020-12-15 VITALS — BP 133/65 | HR 93 | Temp 97.5°F | Resp 16 | Wt 231.9 lb

## 2020-12-15 DIAGNOSIS — G893 Neoplasm related pain (acute) (chronic): Secondary | ICD-10-CM

## 2020-12-15 DIAGNOSIS — Z510 Encounter for antineoplastic radiation therapy: Secondary | ICD-10-CM | POA: Diagnosis present

## 2020-12-15 DIAGNOSIS — Z87891 Personal history of nicotine dependence: Secondary | ICD-10-CM | POA: Diagnosis not present

## 2020-12-15 DIAGNOSIS — C519 Malignant neoplasm of vulva, unspecified: Secondary | ICD-10-CM | POA: Diagnosis present

## 2020-12-15 DIAGNOSIS — Z7189 Other specified counseling: Secondary | ICD-10-CM

## 2020-12-15 LAB — COMPREHENSIVE METABOLIC PANEL
ALT: 7 U/L (ref 0–44)
AST: 21 U/L (ref 15–41)
Albumin: 3.8 g/dL (ref 3.5–5.0)
Alkaline Phosphatase: 88 U/L (ref 38–126)
Anion gap: 8 (ref 5–15)
BUN: 16 mg/dL (ref 8–23)
CO2: 27 mmol/L (ref 22–32)
Calcium: 8.9 mg/dL (ref 8.9–10.3)
Chloride: 100 mmol/L (ref 98–111)
Creatinine, Ser: 0.96 mg/dL (ref 0.44–1.00)
GFR, Estimated: >60 mL/min (ref >60)
Glucose, Bld: 122 mg/dL — ABNORMAL HIGH (ref 70–99)
Potassium: 4.2 mmol/L (ref 3.5–5.1)
Sodium: 135 mmol/L (ref 135–145)
Total Bilirubin: 0.5 mg/dL (ref 0.3–1.2)
Total Protein: 7.1 g/dL (ref 6.5–8.1)

## 2020-12-15 MED ORDER — OXYCODONE HCL 5 MG PO TABS: ORAL_TABLET | ORAL | 0 refills | Status: DC

## 2020-12-15 MED ORDER — NYSTATIN 100000 UNIT/GM EX POWD: 1.0000 "application " | Freq: Three times a day (TID) | CUTANEOUS | 1 refills | Status: DC

## 2020-12-15 NOTE — Progress Notes (Signed)
START OFF PATHWAY REGIMEN - Other   [id number]:Carboplatin + Paclitaxel (2/50) + RT weekly x 6 weeks:   Administer weekly during RT:     Paclitaxel      Carboplatin   **Always confirm dose/schedule in your pharmacy ordering system**  Patient Characteristics: Intent of Therapy: Curative Intent, Discussed with Patient 

## 2020-12-15 NOTE — Progress Notes (Signed)
Hematology/Oncology Consult note [facility] [phone] [phone]  Patient Care Team: Idelle Crouch, MD as PCP - General (Internal Medicine)   Name of the patient: Hannah Howard  [id number]  1941-07-17    Reason for referral-new diagnosis of vulvar cancer   Referring physician-Dr. 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  She does not use any home oxygen.  She lives alone and her daughter and grandchildren watch over her care.  Patient has been complaining of labial pain and swelling and was referred to Dr. Ouida Sills by Dr. Doy Hutching.  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  She was also evaluated by GYN oncology and the tumor was large fungating and ulcerated measuring at least 7 cm involving the labia mid bulbar urethra and encroaching the vagina margin.  There was a concern for possible right inguinal lymph node.  PET CT scan showed no evidence of adenopathy or distant metastatic disease.  Intense hypermetabolic activity in the external genitalia with an SUV of 23.3.  Patient was not deemed to be a surgical candidate and referred for concurrent chemoradiation  Patient lives in a townhouse and has had recent falls due to gait instability.  She also complains of significant vulvar pain for which she was started on hydrocodone but states that it has not been helping her.  She is unable to wear pull-ups all day and the material irritates the vulvar mass.  She has also been incontinent of her urine.  ECOG PS- 2-3  Pain scale- 10   Review of systems- Review of Systems  Constitutional: Positive for malaise/fatigue. Negative for chills, fever and weight loss.  HENT:  Negative for congestion, ear discharge and nosebleeds.   Eyes: Negative for blurred vision.  Respiratory: Negative for cough, hemoptysis, sputum production, shortness of breath and wheezing.   Cardiovascular: Negative for chest pain, palpitations, orthopnea and claudication.  Gastrointestinal: Negative for abdominal pain, blood in stool, constipation, diarrhea, heartburn, melena, nausea and vomiting.  Genitourinary: Negative for dysuria, flank pain, frequency, hematuria and urgency.       Vulvar pain  Musculoskeletal: Positive for falls. Negative for back pain, joint pain and myalgias.  Skin: Negative for rash.  Neurological: Negative for dizziness, tingling, focal weakness, seizures, weakness and headaches.  Endo/Heme/Allergies: Does not bruise/bleed easily.  Psychiatric/Behavioral: Negative for depression and suicidal ideas. The patient does not have insomnia.     Allergies  Allergen Reactions   Amoxicillin Rash   Amoxicillin-Pot Clavulanate Rash   Bronopol Rash    Patch test proven Allergic Contact dermatitis to Bronopol in patients soap products Patch test positive allergic contact dermatitis to Bronopol in patients soap products    Patient Active Problem List   Diagnosis Date Noted   Goals of care, counseling/discussion 12/15/2020   Vulvar cancer ([place]) 12/15/2020   Primary osteoarthritis of right hip 04/02/2020   Pain due to onychomycosis of toenails of both feet 12/09/2019   Chronic generalized pain 02/05/2018   Atypical chest pain 09/05/2017   Dizzy spells 09/05/2017   SOB (shortness of breath) 09/05/2017   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/23/2016   Stiffness of shoulder joint, left 04/09/2016   Obesity (BMI 30-39.9) 01/24/2016   Atrophic vaginitis 07/03/2015   Urinary frequency  07/03/2015   Urge incontinence 07/03/2015   Anxiety and depression 07/08/2014   BP (high blood pressure) 07/08/2014   Hyperlipidemia 07/08/2014   Idiopathic  Parkinson's disease ([place]) 07/08/2014   Parkinson disease ([place]) 07/08/2014   HTN (hypertension) 07/08/2014   Disc disease, degenerative, lumbar or lumbosacral 04/04/2014   Neuritis or radiculitis due to rupture of lumbar intervertebral disc 04/04/2014   Lumbar radiculitis 04/04/2014     Past Medical History:  Diagnosis Date   Anxiety    Arthritis    Complication of anesthesia    woke during on of her knee operations   COPD (chronic obstructive pulmonary disease) (HCC)    DDD (degenerative disc disease), lumbar    Depression    Diverticulosis    GERD (gastroesophageal reflux disease)    Headache    23-3x/month   History of colonic polyps    Hyperlipidemia    Hypertension    Irritable bowel syndrome    Migraine headache    Motion sickness    cars   Parkinson's disease (HCC)    Parkinsonism (HCC)    Vertigo    1 episode, recently   Vulvar cancer ([place])    Wears dentures    partial upper     Past Surgical History:  Procedure Laterality Date   BREAST BIOPSY Left 1973   neg   CATARACT EXTRACTION W/PHACO Right 09/19/2020   Procedure: CATARACT EXTRACTION PHACO AND INTRAOCULAR LENS PLACEMENT ([place]) RIGHT;  Surgeon: Birder Robson, MD;  Location: [place];  Service: Ophthalmology;  Laterality: Right;  13.20 1:12.3   CATARACT EXTRACTION W/PHACO Left 10/10/2020   Procedure: CATARACT EXTRACTION PHACO AND INTRAOCULAR LENS PLACEMENT ([facility]) LEFT 8.86 00:51.5;  Surgeon: Birder Robson, MD;  Location: [place];  Service: Ophthalmology;  Laterality: Left;   CHOLECYSTECTOMY     COLONOSCOPY WITH PROPOFOL N/A 04/07/2020   Procedure: COLONOSCOPY WITH PROPOFOL;  Surgeon: Robert Bellow, MD;  Location: [facility];  Service: Endoscopy;  Laterality: N/A;   KNEE ARTHROSCOPY Right 2010   KNEE ARTHROSCOPY Left 2012   TOTAL KNEE ARTHROPLASTY Right 10/18/2009   TOTAL KNEE ARTHROPLASTY Left 10/08/2010   TUBAL LIGATION       Social History   Socioeconomic History   Marital status: Divorced    Spouse name: Not on file   Number of children: Not on file   Years of education: Not on file   Highest education level: Not on file  Occupational History   Not on file  Tobacco Use   Smoking status: Former Smoker    Types: Cigarettes    Quit date: 07/02/2000    Years since quitting: 20.4   Smokeless tobacco: Never Used  Vaping Use   Vaping Use: Never used  Substance and Sexual Activity   Alcohol use: No    Alcohol/week: 0.0 standard drinks   Drug use: No   Sexual activity: Not Currently  Other Topics Concern   Not on file  Social History Narrative   Not on file   Social Determinants of Health   Financial Resource Strain: Not on file  Food Insecurity: Not on file  Transportation Needs: Not on file  Physical Activity: Not on file  Stress: Not on file  Social Connections: Not on file  Intimate Partner Violence: Not on file     Family History  Problem Relation Age of Onset   Heart disease Mother    Renal cancer Father    Breast cancer Neg Hx      Current  Outpatient Medications:    aspirin EC 81 MG tablet, Take by mouth., Disp: , Rfl:    carbidopa-levodopa (SINEMET CR) 50-200 MG tablet, Take 1 tablet by mouth at bedtime., Disp: , Rfl:    carbidopa-levodopa (SINEMET IR) 25-250 MG tablet, Take 1 tablet by mouth 3 (three) times daily., Disp: , Rfl:    celecoxib (CELEBREX) 200 MG capsule, TAKE 1 CAPSULE BY MOUTH EVERY DAY, Disp: , Rfl:    clotrimazole-betamethasone (LOTRISONE) cream, Apply topically., Disp: , Rfl:    doxepin (SINEQUAN) 25 MG capsule, Take 25 mg by mouth at bedtime., Disp: , Rfl:    gabapentin (NEURONTIN) 100 MG capsule, Take 100 mg by mouth daily., Disp: , Rfl:    HYDROcodone-acetaminophen (NORCO/VICODIN) 5-325 MG tablet, Take 1 tablet by mouth every 6 (six) hours as needed for pain., Disp: , Rfl:    hydrOXYzine (ATARAX/VISTARIL) 50 MG tablet, TAKE ONE  TABLET 3 TIMES DAILY AS NEEDED FOR ITCHING, Disp: , Rfl:    lansoprazole (PREVACID) 30 MG capsule, TAKE 1 CAPSULE BY MOUTH EVERY DAY, Disp: , Rfl:    meclizine (ANTIVERT) 25 MG tablet, Take 25 mg by mouth 3 (three) times daily as needed for dizziness., Disp: , Rfl:    montelukast (SINGULAIR) 10 MG tablet, TAKE ONE TABLET EVERY DAY, Disp: 30 tablet, Rfl: 0   Multiple Vitamin (MULTIVITAMIN) capsule, Take by mouth., Disp: , Rfl:    nystatin (MYCOSTATIN/NYSTOP) powder, Apply 1 application topically 3 (three) times daily., Disp: 30 g, Rfl: 1   Polyethylene Glycol 3350 (MIRALAX PO), Take 1 Dose by mouth daily as needed., Disp: , Rfl:    sertraline (ZOLOFT) 100 MG tablet, TAKE ONE TABLET BY MOUTH EVERY DAY, Disp: , Rfl:    traZODone (DESYREL) 150 MG tablet, TAKE ONE TABLET BY MOUTH AT BEDTIME, Disp: , Rfl:    folic acid (FOLVITE) 1 MG tablet, Take 1 mg by mouth daily., Disp: , Rfl:    lidocaine (XYLOCAINE) 2 % jelly, Apply topically as needed. (Patient not taking: Reported on 12/15/2020), Disp: , Rfl:    methotrexate (RHEUMATREX) 2.5 MG tablet, Take by mouth., Disp: , Rfl:    oxyCODONE (OXY IR/ROXICODONE) 5 MG immediate release tablet, 1-2 tablets every 4 hours as needed, Disp: 120 tablet, Rfl: 0   solifenacin (VESICARE) 10 MG tablet, Take 10 mg by mouth daily. (Patient not taking: Reported on 12/15/2020), Disp: , Rfl:    triamcinolone cream (KENALOG) 0.1 %, Apply topically 2 (two) times daily. Avoid face, groin, underarms. (Patient not taking: No sig reported), Disp: 454 g, Rfl: 1   Physical exam:  Vitals:   12/15/20 1045  BP: 133/65  Pulse: 93  Resp: 16  Temp: (!) 97.5 F (36.4 C)  TempSrc: Tympanic  SpO2: 100%  Weight: 231 lb 14.4 oz (105.2 kg)   Physical Exam Constitutional:      Comments: Patient is sitting in a wheelchair and appears in mild distress from ongoing pain  Eyes:     Extraocular Movements: EOM normal.  Cardiovascular:     Rate and Rhythm: Normal rate and  regular rhythm.     Heart sounds: Normal heart sounds.  Pulmonary:     Effort: Pulmonary effort is normal.     Breath sounds: Normal breath sounds.  Abdominal:     General: Bowel sounds are normal.     Palpations: Abdomen is soft.     Comments: Full pelvic exam not performed.  On examination of external genitalia there is a large fungating mass seen involving  the right labial fold  Skin:    General: Skin is warm and dry.  Neurological:     Mental Status: She is alert and oriented to person, place, and time.        CMP Latest Ref Rng & Units 12/15/2020  Glucose 70 - 99 mg/dL 122(H)  BUN 8 - 23 mg/dL 16  Creatinine 0.44 - 1.00 mg/dL 0.96  Sodium 135 - 145 mmol/L 135  Potassium 3.5 - 5.1 mmol/L 4.2  Chloride 98 - 111 mmol/L 100  CO2 22 - 32 mmol/L 27  Calcium 8.9 - 10.3 mg/dL 8.9  Total Protein 6.5 - 8.1 g/dL 7.1  Total Bilirubin 0.3 - 1.2 mg/dL 0.5  Alkaline Phos 38 - 126 U/L 88  AST 15 - 41 U/L 21  ALT 0 - 44 U/L 7   CBC Latest Ref Rng & Units 12/06/2020  WBC 4.0 - 10.5 K/uL 11.0(H)  Hemoglobin 12.0 - 15.0 g/dL 11.1(L)  Hematocrit 36.0 - 46.0 % 33.4(L)  Platelets 150 - 400 K/uL 473(H)    No images are attached to the encounter.  NM PET Image Initial (PI) Skull Base To Thigh  Result Date: 12/13/2020 CLINICAL DATA:  Initial treatment strategy for vulvar carcinoma. EXAM: NUCLEAR MEDICINE PET SKULL BASE TO THIGH TECHNIQUE: 11.8 mCi F-18 FDG was injected intravenously. Full-ring PET imaging was performed from the skull base to thigh after the radiotracer. CT data was obtained and used for attenuation correction and anatomic localization. Fasting blood glucose: 75 mg/dl COMPARISON:  None. FINDINGS: Mediastinal blood pool activity: SUV max 2.8 Liver activity: SUV max NA NECK: No hypermetabolic lymph nodes in the neck. Incidental CT findings: none CHEST: No hypermetabolic mediastinal or hilar nodes. No suspicious pulmonary nodules on the CT scan. Incidental CT findings: none  ABDOMEN/PELVIS: Intense metabolic activity associated with the external genitalia with SUV max equal 23.3. No hypermetabolic inguinal nodes. No hypermetabolic iliac nodes. No hypermetabolic retroperitoneal nodes. No lymphadenopathy by CT imaging. No abnormal activity liver. Incidental CT findings: Postcholecystectomy. Atherosclerotic calcification of the aorta. Uterus and adnexa normal. SKELETON: No focal hypermetabolic activity to suggest skeletal metastasis. Incidental CT findings: none IMPRESSION: 1. Intense hypermetabolic activity associated with the external genitalia consistent with primary vulvar carcinoma. 2. No evidence metastatic adenopathy in the pelvis. 3. No evidence visceral metastasis or skeletal metastasis Electronically Signed   By: Suzy Bouchard M.D.   On: 12/13/2020 13:53    Assessment and plan- Patient is a 80 y.o. 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  PET CT scan does not show any evidence of hypermetabolic inguinal or pelvic adenopathy suggestive of distant metastatic disease.  Patient has a fairly large right labial mass involving possibly the urethra and the vagina margin and would be considered T2.  Patient therefore has stage II disease.  Discussed with the patient and daughter that patient has a borderline performance status given her baseline parkinsonism and recent falls.  Chemoradiation would be a standard of care in stage II vulvar cancer which is associated with a better clinical response as well as decreased recurrence rate and better overall survival as compared to radiation alone.  Weekly cisplatin would be the standard of care in this situation however patient is elderly and frail and has inconsistent social support at home.  Discussed potential side effects of cisplatin including all but  not limited to nausea, vomiting, low blood counts, risk of worsening kidney dysfunction, hearing loss and worsening peripheral neuropathy.  Weekly CarboTaxol is also an option although it was not necessarily studied in a curative setting but has been used in metastatic disease and may be better tolerated than weekly cisplatin.  Patient and her daughter verbalized understanding and are agreeable to proceed as planned.  I have also encouraged the daughter to have a consistent 24-hour supervision for the patient while she is receiving chemotherapy and her daughter is agreed to have that set up.  Treatment will be given with a curative intent  Discussed with the patient and her daughter that if we decide not to do chemotherapy she would require higher dose of radiation which may bring in more local toxicity and given the fact that she has a fairly large tumor it would be reasonable to try chemoradiation at this time.  If she has problems tolerating chemotherapy I will have a low threshold to stop it and then continue radiation alone.  Plan for port placement and chemotherapy teach next week and I will see her back in 2 weeks to start first cycle of weekly CarboTaxol chemotherapy  Cancer Staging Vulvar cancer [facility]) Staging form: Vulva, AJCC 8th Edition - Clinical stage from 12/15/2020: FIGO Stage II (cT2, cN0, cM0) - Signed by Sindy Guadeloupe, MD on 12/15/2020   Thank you for this kind referral and the opportunity to participate in the care of this patient   Visit Diagnosis 1. Vulvar cancer ([place])   2. Goals of care, counseling/discussion   3. Neoplasm related pain     Dr. Randa Evens, MD, MPH [facility] [id number] 12/15/2020 2:10 PM

## 2020-12-18 ENCOUNTER — Telehealth (INDEPENDENT_AMBULATORY_CARE_PROVIDER_SITE_OTHER): Payer: Self-pay

## 2020-12-18 NOTE — Patient Instructions (Signed)
Paclitaxel injection What is this medicine? PACLITAXEL (PAK li TAX el) is a chemotherapy drug. It targets fast dividing cells, like cancer cells, and causes these cells to die. This medicine is used to treat ovarian cancer, breast cancer, lung cancer, Kaposi's sarcoma, and other cancers. This medicine may be used for other purposes; ask your health care provider or pharmacist if you have questions. COMMON BRAND NAME(S): Onxol, Taxol What should I tell my health care provider before I take this medicine? They need to know if you have any of these conditions:  history of irregular heartbeat  liver disease  low blood counts, like low white cell, platelet, or red cell counts  lung or breathing disease, like asthma  tingling of the fingers or toes, or other nerve disorder  an unusual or allergic reaction to paclitaxel, alcohol, polyoxyethylated castor oil, other chemotherapy, other medicines, foods, dyes, or preservatives  pregnant or trying to get pregnant  breast-feeding How should I use this medicine? This drug is given as an infusion into a vein. It is administered in a hospital or clinic by a specially trained health care professional. Talk to your pediatrician regarding the use of this medicine in children. Special care may be needed. Overdosage: If you think you have taken too much of this medicine contact a poison control center or emergency room at once. NOTE: This medicine is only for you. Do not share this medicine with others. What if I miss a dose? It is important not to miss your dose. Call your doctor or health care professional if you are unable to keep an appointment. What may interact with this medicine? Do not take this medicine with any of the following medications:  live virus vaccines This medicine may also interact with the following medications:  antiviral medicines for hepatitis, HIV or AIDS  certain antibiotics like erythromycin and clarithromycin  certain  medicines for fungal infections like ketoconazole and itraconazole  certain medicines for seizures like carbamazepine, phenobarbital, phenytoin  gemfibrozil  nefazodone  rifampin  St. John's wort This list may not describe all possible interactions. Give your health care provider a list of all the medicines, herbs, non-prescription drugs, or dietary supplements you use. Also tell them if you smoke, drink alcohol, or use illegal drugs. Some items may interact with your medicine. What should I watch for while using this medicine? Your condition will be monitored carefully while you are receiving this medicine. You will need important blood work done while you are taking this medicine. This medicine can cause serious allergic reactions. To reduce your risk you will need to take other medicine(s) before treatment with this medicine. If you experience allergic reactions like skin rash, itching or hives, swelling of the face, lips, or tongue, tell your doctor or health care professional right away. In some cases, you may be given additional medicines to help with side effects. Follow all directions for their use. This drug may make you feel generally unwell. This is not uncommon, as chemotherapy can affect healthy cells as well as cancer cells. Report any side effects. Continue your course of treatment even though you feel ill unless your doctor tells you to stop. Call your doctor or health care professional for advice if you get a fever, chills or sore throat, or other symptoms of a cold or flu. Do not treat yourself. This drug decreases your body's ability to fight infections. Try to avoid being around people who are sick. This medicine may increase your risk to bruise   or bleed. Call your doctor or health care professional if you notice any unusual bleeding. Be careful brushing and flossing your teeth or using a toothpick because you may get an infection or bleed more easily. If you have any dental  work done, tell your dentist you are receiving this medicine. Avoid taking products that contain aspirin, acetaminophen, ibuprofen, naproxen, or ketoprofen unless instructed by your doctor. These medicines may hide a fever. Do not become pregnant while taking this medicine. Women should inform their doctor if they wish to become pregnant or think they might be pregnant. There is a potential for serious side effects to an unborn child. Talk to your health care professional or pharmacist for more information. Do not breast-feed an infant while taking this medicine. Men are advised not to father a child while receiving this medicine. This product may contain alcohol. Ask your pharmacist or healthcare provider if this medicine contains alcohol. Be sure to tell all healthcare providers you are taking this medicine. Certain medicines, like metronidazole and disulfiram, can cause an unpleasant reaction when taken with alcohol. The reaction includes flushing, headache, nausea, vomiting, sweating, and increased thirst. The reaction can last from 30 minutes to several hours. What side effects may I notice from receiving this medicine? Side effects that you should report to your doctor or health care professional as soon as possible:  allergic reactions like skin rash, itching or hives, swelling of the face, lips, or tongue  breathing problems  changes in vision  fast, irregular heartbeat  high or low blood pressure  mouth sores  pain, tingling, numbness in the hands or feet  signs of decreased platelets or bleeding - bruising, pinpoint red spots on the skin, black, tarry stools, blood in the urine  signs of decreased red blood cells - unusually weak or tired, feeling faint or lightheaded, falls  signs of infection - fever or chills, cough, sore throat, pain or difficulty passing urine  signs and symptoms of liver injury like dark yellow or brown urine; general ill feeling or flu-like symptoms;  light-colored stools; loss of appetite; nausea; right upper belly pain; unusually weak or tired; yellowing of the eyes or skin  swelling of the ankles, feet, hands  unusually slow heartbeat Side effects that usually do not require medical attention (report to your doctor or health care professional if they continue or are bothersome):  diarrhea  hair loss  loss of appetite  muscle or joint pain  nausea, vomiting  pain, redness, or irritation at site where injected  tiredness This list may not describe all possible side effects. Call your doctor for medical advice about side effects. You may report side effects to FDA at 1-800-FDA-1088. Where should I keep my medicine? This drug is given in a hospital or clinic and will not be stored at home. NOTE: This sheet is a summary. It may not cover all possible information. If you have questions about this medicine, talk to your doctor, pharmacist, or health care provider.  2021 [employer] Standard (2019-10-13 13:37:23) Carboplatin injection What is this medicine? CARBOPLATIN (KAR boe pla tin) is a chemotherapy drug. It targets fast dividing cells, like cancer cells, and causes these cells to die. This medicine is used to treat ovarian cancer and many other cancers. This medicine may be used for other purposes; ask your health care provider or pharmacist if you have questions. COMMON BRAND NAME(S): Paraplatin What should I tell my health care provider before I take this medicine? They need to   know if you have any of these conditions:  blood disorders  hearing problems  kidney disease  recent or ongoing radiation therapy  an unusual or allergic reaction to carboplatin, cisplatin, other chemotherapy, other medicines, foods, dyes, or preservatives  pregnant or trying to get pregnant  breast-feeding How should I use this medicine? This drug is usually given as an infusion into a vein. It is administered in a hospital or clinic by  a specially trained health care professional. Talk to your pediatrician regarding the use of this medicine in children. Special care may be needed. Overdosage: If you think you have taken too much of this medicine contact a poison control center or emergency room at once. NOTE: This medicine is only for you. Do not share this medicine with others. What if I miss a dose? It is important not to miss a dose. Call your doctor or health care professional if you are unable to keep an appointment. What may interact with this medicine?  medicines for seizures  medicines to increase blood counts like filgrastim, pegfilgrastim, sargramostim  some antibiotics like amikacin, gentamicin, neomycin, streptomycin, tobramycin  vaccines Talk to your doctor or health care professional before taking any of these medicines:  acetaminophen  aspirin  ibuprofen  ketoprofen  naproxen This list may not describe all possible interactions. Give your health care provider a list of all the medicines, herbs, non-prescription drugs, or dietary supplements you use. Also tell them if you smoke, drink alcohol, or use illegal drugs. Some items may interact with your medicine. What should I watch for while using this medicine? Your condition will be monitored carefully while you are receiving this medicine. You will need important blood work done while you are taking this medicine. This drug may make you feel generally unwell. This is not uncommon, as chemotherapy can affect healthy cells as well as cancer cells. Report any side effects. Continue your course of treatment even though you feel ill unless your doctor tells you to stop. In some cases, you may be given additional medicines to help with side effects. Follow all directions for their use. Call your doctor or health care professional for advice if you get a fever, chills or sore throat, or other symptoms of a cold or flu. Do not treat yourself. This drug decreases  your body's ability to fight infections. Try to avoid being around people who are sick. This medicine may increase your risk to bruise or bleed. Call your doctor or health care professional if you notice any unusual bleeding. Be careful brushing and flossing your teeth or using a toothpick because you may get an infection or bleed more easily. If you have any dental work done, tell your dentist you are receiving this medicine. Avoid taking products that contain aspirin, acetaminophen, ibuprofen, naproxen, or ketoprofen unless instructed by your doctor. These medicines may hide a fever. Do not become pregnant while taking this medicine. Women should inform their doctor if they wish to become pregnant or think they might be pregnant. There is a potential for serious side effects to an unborn child. Talk to your health care professional or pharmacist for more information. Do not breast-feed an infant while taking this medicine. What side effects may I notice from receiving this medicine? Side effects that you should report to your doctor or health care professional as soon as possible:  allergic reactions like skin rash, itching or hives, swelling of the face, lips, or tongue  signs of infection - fever or   chills, cough, sore throat, pain or difficulty passing urine  signs of decreased platelets or bleeding - bruising, pinpoint red spots on the skin, black, tarry stools, nosebleeds  signs of decreased red blood cells - unusually weak or tired, fainting spells, lightheadedness  breathing problems  changes in hearing  changes in vision  chest pain  high blood pressure  low blood counts - This drug may decrease the number of white blood cells, red blood cells and platelets. You may be at increased risk for infections and bleeding.  nausea and vomiting  pain, swelling, redness or irritation at the injection site  pain, tingling, numbness in the hands or feet  problems with balance,  talking, walking  trouble passing urine or change in the amount of urine Side effects that usually do not require medical attention (report to your doctor or health care professional if they continue or are bothersome):  hair loss  loss of appetite  metallic taste in the mouth or changes in taste This list may not describe all possible side effects. Call your doctor for medical advice about side effects. You may report side effects to FDA at 1-800-FDA-1088. Where should I keep my medicine? This drug is given in a hospital or clinic and will not be stored at home. NOTE: This sheet is a summary. It may not cover all possible information. If you have questions about this medicine, talk to your doctor, pharmacist, or health care provider.  2021 [employer] Standard (2008-02-16 14:38:05)  

## 2020-12-18 NOTE — Patient Instructions (Incomplete)
Paclitaxel injection What is this medicine? PACLITAXEL (PAK li TAX el) is a chemotherapy drug. It targets fast dividing cells, like cancer cells, and causes these cells to die. This medicine is used to treat ovarian cancer, breast cancer, lung cancer, Kaposi's sarcoma, and other cancers. This medicine may be used for other purposes; ask your health care provider or pharmacist if you have questions. COMMON BRAND NAME(S): Onxol, Taxol What should I tell my health care provider before I take this medicine? They need to know if you have any of these conditions:  history of irregular heartbeat  liver disease  low blood counts, like low white cell, platelet, or red cell counts  lung or breathing disease, like asthma  tingling of the fingers or toes, or other nerve disorder  an unusual or allergic reaction to paclitaxel, alcohol, polyoxyethylated castor oil, other chemotherapy, other medicines, foods, dyes, or preservatives  pregnant or trying to get pregnant  breast-feeding How should I use this medicine? This drug is given as an infusion into a vein. It is administered in a hospital or clinic by a specially trained health care professional. Talk to your pediatrician regarding the use of this medicine in children. Special care may be needed. Overdosage: If you think you have taken too much of this medicine contact a poison control center or emergency room at once. NOTE: This medicine is only for you. Do not share this medicine with others. What if I miss a dose? It is important not to miss your dose. Call your doctor or health care professional if you are unable to keep an appointment. What may interact with this medicine? Do not take this medicine with any of the following medications:  live virus vaccines This medicine may also interact with the following medications:  antiviral medicines for hepatitis, HIV or AIDS  certain antibiotics like erythromycin and clarithromycin  certain  medicines for fungal infections like ketoconazole and itraconazole  certain medicines for seizures like carbamazepine, phenobarbital, phenytoin  gemfibrozil  nefazodone  rifampin  St. John's wort This list may not describe all possible interactions. Give your health care provider a list of all the medicines, herbs, non-prescription drugs, or dietary supplements you use. Also tell them if you smoke, drink alcohol, or use illegal drugs. Some items may interact with your medicine. What should I watch for while using this medicine? Your condition will be monitored carefully while you are receiving this medicine. You will need important blood work done while you are taking this medicine. This medicine can cause serious allergic reactions. To reduce your risk you will need to take other medicine(s) before treatment with this medicine. If you experience allergic reactions like skin rash, itching or hives, swelling of the face, lips, or tongue, tell your doctor or health care professional right away. In some cases, you may be given additional medicines to help with side effects. Follow all directions for their use. This drug may make you feel generally unwell. This is not uncommon, as chemotherapy can affect healthy cells as well as cancer cells. Report any side effects. Continue your course of treatment even though you feel ill unless your doctor tells you to stop. Call your doctor or health care professional for advice if you get a fever, chills or sore throat, or other symptoms of a cold or flu. Do not treat yourself. This drug decreases your body's ability to fight infections. Try to avoid being around people who are sick. This medicine may increase your risk to bruise   or bleed. Call your doctor or health care professional if you notice any unusual bleeding. Be careful brushing and flossing your teeth or using a toothpick because you may get an infection or bleed more easily. If you have any dental  work done, tell your dentist you are receiving this medicine. Avoid taking products that contain aspirin, acetaminophen, ibuprofen, naproxen, or ketoprofen unless instructed by your doctor. These medicines may hide a fever. Do not become pregnant while taking this medicine. Women should inform their doctor if they wish to become pregnant or think they might be pregnant. There is a potential for serious side effects to an unborn child. Talk to your health care professional or pharmacist for more information. Do not breast-feed an infant while taking this medicine. Men are advised not to father a child while receiving this medicine. This product may contain alcohol. Ask your pharmacist or healthcare provider if this medicine contains alcohol. Be sure to tell all healthcare providers you are taking this medicine. Certain medicines, like metronidazole and disulfiram, can cause an unpleasant reaction when taken with alcohol. The reaction includes flushing, headache, nausea, vomiting, sweating, and increased thirst. The reaction can last from 30 minutes to several hours. What side effects may I notice from receiving this medicine? Side effects that you should report to your doctor or health care professional as soon as possible:  allergic reactions like skin rash, itching or hives, swelling of the face, lips, or tongue  breathing problems  changes in vision  fast, irregular heartbeat  high or low blood pressure  mouth sores  pain, tingling, numbness in the hands or feet  signs of decreased platelets or bleeding - bruising, pinpoint red spots on the skin, black, tarry stools, blood in the urine  signs of decreased red blood cells - unusually weak or tired, feeling faint or lightheaded, falls  signs of infection - fever or chills, cough, sore throat, pain or difficulty passing urine  signs and symptoms of liver injury like dark yellow or brown urine; general ill feeling or flu-like symptoms;  light-colored stools; loss of appetite; nausea; right upper belly pain; unusually weak or tired; yellowing of the eyes or skin  swelling of the ankles, feet, hands  unusually slow heartbeat Side effects that usually do not require medical attention (report to your doctor or health care professional if they continue or are bothersome):  diarrhea  hair loss  loss of appetite  muscle or joint pain  nausea, vomiting  pain, redness, or irritation at site where injected  tiredness This list may not describe all possible side effects. Call your doctor for medical advice about side effects. You may report side effects to FDA at 1-800-FDA-1088. Where should I keep my medicine? This drug is given in a hospital or clinic and will not be stored at home. NOTE: This sheet is a summary. It may not cover all possible information. If you have questions about this medicine, talk to your doctor, pharmacist, or health care provider.  2021 Elsevier/Gold Standard (2019-10-13 13:37:23) Carboplatin injection What is this medicine? CARBOPLATIN (KAR boe pla tin) is a chemotherapy drug. It targets fast dividing cells, like cancer cells, and causes these cells to die. This medicine is used to treat ovarian cancer and many other cancers. This medicine may be used for other purposes; ask your health care provider or pharmacist if you have questions. COMMON BRAND NAME(S): Paraplatin What should I tell my health care provider before I take this medicine? They need to   know if you have any of these conditions:  blood disorders  hearing problems  kidney disease  recent or ongoing radiation therapy  an unusual or allergic reaction to carboplatin, cisplatin, other chemotherapy, other medicines, foods, dyes, or preservatives  pregnant or trying to get pregnant  breast-feeding How should I use this medicine? This drug is usually given as an infusion into a vein. It is administered in a hospital or clinic by  a specially trained health care professional. Talk to your pediatrician regarding the use of this medicine in children. Special care may be needed. Overdosage: If you think you have taken too much of this medicine contact a poison control center or emergency room at once. NOTE: This medicine is only for you. Do not share this medicine with others. What if I miss a dose? It is important not to miss a dose. Call your doctor or health care professional if you are unable to keep an appointment. What may interact with this medicine?  medicines for seizures  medicines to increase blood counts like filgrastim, pegfilgrastim, sargramostim  some antibiotics like amikacin, gentamicin, neomycin, streptomycin, tobramycin  vaccines Talk to your doctor or health care professional before taking any of these medicines:  acetaminophen  aspirin  ibuprofen  ketoprofen  naproxen This list may not describe all possible interactions. Give your health care provider a list of all the medicines, herbs, non-prescription drugs, or dietary supplements you use. Also tell them if you smoke, drink alcohol, or use illegal drugs. Some items may interact with your medicine. What should I watch for while using this medicine? Your condition will be monitored carefully while you are receiving this medicine. You will need important blood work done while you are taking this medicine. This drug may make you feel generally unwell. This is not uncommon, as chemotherapy can affect healthy cells as well as cancer cells. Report any side effects. Continue your course of treatment even though you feel ill unless your doctor tells you to stop. In some cases, you may be given additional medicines to help with side effects. Follow all directions for their use. Call your doctor or health care professional for advice if you get a fever, chills or sore throat, or other symptoms of a cold or flu. Do not treat yourself. This drug decreases  your body's ability to fight infections. Try to avoid being around people who are sick. This medicine may increase your risk to bruise or bleed. Call your doctor or health care professional if you notice any unusual bleeding. Be careful brushing and flossing your teeth or using a toothpick because you may get an infection or bleed more easily. If you have any dental work done, tell your dentist you are receiving this medicine. Avoid taking products that contain aspirin, acetaminophen, ibuprofen, naproxen, or ketoprofen unless instructed by your doctor. These medicines may hide a fever. Do not become pregnant while taking this medicine. Women should inform their doctor if they wish to become pregnant or think they might be pregnant. There is a potential for serious side effects to an unborn child. Talk to your health care professional or pharmacist for more information. Do not breast-feed an infant while taking this medicine. What side effects may I notice from receiving this medicine? Side effects that you should report to your doctor or health care professional as soon as possible:  allergic reactions like skin rash, itching or hives, swelling of the face, lips, or tongue  signs of infection - fever or   chills, cough, sore throat, pain or difficulty passing urine  signs of decreased platelets or bleeding - bruising, pinpoint red spots on the skin, black, tarry stools, nosebleeds  signs of decreased red blood cells - unusually weak or tired, fainting spells, lightheadedness  breathing problems  changes in hearing  changes in vision  chest pain  high blood pressure  low blood counts - This drug may decrease the number of white blood cells, red blood cells and platelets. You may be at increased risk for infections and bleeding.  nausea and vomiting  pain, swelling, redness or irritation at the injection site  pain, tingling, numbness in the hands or feet  problems with balance,  talking, walking  trouble passing urine or change in the amount of urine Side effects that usually do not require medical attention (report to your doctor or health care professional if they continue or are bothersome):  hair loss  loss of appetite  metallic taste in the mouth or changes in taste This list may not describe all possible side effects. Call your doctor for medical advice about side effects. You may report side effects to FDA at 1-800-FDA-1088. Where should I keep my medicine? This drug is given in a hospital or clinic and will not be stored at home. NOTE: This sheet is a summary. It may not cover all possible information. If you have questions about this medicine, talk to your doctor, pharmacist, or health care provider.  2021 Elsevier/Gold Standard (2008-02-16 14:38:05)  

## 2020-12-18 NOTE — Telephone Encounter (Signed)
Spoke with the patient and per the patient she wanted me to speak with her daughter that was with her. Patient scheduled with Dr. Lucky Cowboy for a port placement on 12/28/20 with a 8:00 am arrival to the [facility]. Covid testing on 12/26/20 between 8-1 pm at the [facility]. Pre-procedure instructions were discussed and will be mailed.

## 2020-12-19 ENCOUNTER — Inpatient Hospital Stay: Payer: Medicare Other

## 2020-12-20 ENCOUNTER — Encounter: Payer: Self-pay | Admitting: Oncology

## 2020-12-20 DIAGNOSIS — Z510 Encounter for antineoplastic radiation therapy: Secondary | ICD-10-CM | POA: Diagnosis not present

## 2020-12-24 ENCOUNTER — Encounter: Payer: Self-pay | Admitting: Oncology

## 2020-12-25 ENCOUNTER — Inpatient Hospital Stay: Payer: Medicare Other

## 2020-12-25 ENCOUNTER — Telehealth (INDEPENDENT_AMBULATORY_CARE_PROVIDER_SITE_OTHER): Payer: Self-pay

## 2020-12-25 ENCOUNTER — Inpatient Hospital Stay: Payer: Medicare Other | Admitting: Oncology

## 2020-12-25 ENCOUNTER — Ambulatory Visit: Admission: RE | Admit: 2020-12-25 | Payer: Medicare Other | Source: Ambulatory Visit

## 2020-12-25 ENCOUNTER — Inpatient Hospital Stay (HOSPITAL_BASED_OUTPATIENT_CLINIC_OR_DEPARTMENT_OTHER): Payer: Medicare Other | Admitting: Hospice and Palliative Medicine

## 2020-12-25 VITALS — BP 98/67 | HR 93 | Temp 98.0°F | Resp 16

## 2020-12-25 DIAGNOSIS — Z515 Encounter for palliative care: Secondary | ICD-10-CM

## 2020-12-25 DIAGNOSIS — G893 Neoplasm related pain (acute) (chronic): Secondary | ICD-10-CM | POA: Diagnosis not present

## 2020-12-25 DIAGNOSIS — Z7189 Other specified counseling: Secondary | ICD-10-CM | POA: Diagnosis not present

## 2020-12-25 DIAGNOSIS — C519 Malignant neoplasm of vulva, unspecified: Secondary | ICD-10-CM

## 2020-12-25 NOTE — Progress Notes (Signed)
[facility]  [phone] [phone]   Name: Hannah Howard Date: 9/45/0388 MRN: [id number]  DOB: 1941-10-13  Patient Care Team: Idelle Crouch, MD as PCP - General (Internal Medicine)    REASON FOR CONSULTATION: Hannah Howard is a 80 y.o. 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 PET scan showed no evidence of distant metastatic disease. Plan was for chemoradiation but patient decided to forego chemotherapy and just pursue XRT. She has had severe pain and was referred to palliative care to help address goals and manage ongoing symptoms.  SOCIAL HISTORY:     reports that she quit smoking about 20 years ago. Her smoking use included cigarettes. She has never used smokeless tobacco. She reports that she does not drink alcohol and does not use drugs.  Patient is widowed. She lives at home alone but recently moved to [facility]. Patient has a son and daughter who live nearby. Patient's granddaughter is also involved in her care. 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  ADVANCE DIRECTIVES:  Not on file  CODE STATUS: Full code  PAST MEDICAL HISTORY: Past Medical History:  Diagnosis Date  . Anxiety   . Arthritis   . Complication of anesthesia    woke during on of her knee operations  . COPD (chronic obstructive pulmonary disease) ([place])   . DDD (degenerative disc disease), lumbar   . Depression   . Diverticulosis   . GERD (gastroesophageal reflux disease)   . Headache    23-3x/month  . History of colonic polyps   . Hyperlipidemia   . Hypertension   . Irritable bowel syndrome   . Migraine headache   . Motion sickness    cars  . Parkinson's disease ([place])   . Parkinsonism ([place])   . Vertigo    1 episode, recently  . Vulvar cancer ([place])   . Wears dentures    partial upper    PAST  SURGICAL HISTORY:  Past Surgical History:  Procedure Laterality Date  . BREAST BIOPSY Left 1973   neg  . CATARACT EXTRACTION W/PHACO Right 09/19/2020   Procedure: CATARACT EXTRACTION PHACO AND INTRAOCULAR LENS PLACEMENT ([place]) RIGHT;  Surgeon: Birder Robson, MD;  Location: [facility];  Service: Ophthalmology;  Laterality: Right;  13.20 1:12.3  . CATARACT EXTRACTION W/PHACO Left 10/10/2020   Procedure: CATARACT EXTRACTION PHACO AND INTRAOCULAR LENS PLACEMENT ([facility]) LEFT 8.86 00:51.5;  Surgeon: Birder Robson, MD;  Location: [facility];  Service: Ophthalmology;  Laterality: Left;  . CHOLECYSTECTOMY    . COLONOSCOPY WITH PROPOFOL N/A 04/07/2020   Procedure: COLONOSCOPY WITH PROPOFOL;  Surgeon: Robert Bellow, MD;  Location: [facility];  Service: Endoscopy;  Laterality: N/A;  . KNEE ARTHROSCOPY Right 2010  . KNEE ARTHROSCOPY Left 2012  . TOTAL KNEE ARTHROPLASTY Right 10/18/2009  . TOTAL KNEE ARTHROPLASTY Left 10/08/2010  . TUBAL LIGATION      HEMATOLOGY/ONCOLOGY HISTORY:  Oncology History  Vulvar cancer ([place])  12/15/2020 Initial Diagnosis   Vulvar cancer ([place])   12/15/2020 Cancer Staging   Staging form: Vulva, AJCC 8th Edition - Clinical stage from 12/15/2020: FIGO Stage II (cT2, cN0, cM0) - Signed by Sindy Guadeloupe, MD on 12/15/2020   12/18/2020 -  Chemotherapy    Patient is on Treatment Plan: VULVAR CARCINOMA CARBOPLATIN/PACLITAXEL WEEKLY X 6 WEEKS WITH XRT  ALLERGIES:  is allergic to amoxicillin, amoxicillin-pot clavulanate, and bronopol.  MEDICATIONS:  Current Outpatient Medications  Medication Sig Dispense Refill  . aspirin EC 81 MG tablet Take 81 mg by mouth.    . carbidopa-levodopa (SINEMET CR) 50-200 MG tablet Take 1 tablet by mouth at bedtime.    . carbidopa-levodopa (SINEMET IR) 25-250 MG tablet Take 1 tablet by mouth 3 (three) times daily.    . celecoxib (CELEBREX) 200 MG capsule TAKE 1 CAPSULE BY MOUTH EVERY DAY    . doxepin  (SINEQUAN) 25 MG capsule Take 25 mg by mouth at bedtime.    . gabapentin (NEURONTIN) 100 MG capsule Take 100 mg by mouth daily.    . hydrOXYzine (ATARAX/VISTARIL) 50 MG tablet TAKE ONE TABLET 3 TIMES DAILY AS NEEDED FOR ITCHING    . lansoprazole (PREVACID) 30 MG capsule TAKE 1 CAPSULE BY MOUTH EVERY DAY    . meclizine (ANTIVERT) 25 MG tablet Take 25 mg by mouth 3 (three) times daily as needed for dizziness.    . montelukast (SINGULAIR) 10 MG tablet TAKE ONE TABLET EVERY DAY 30 tablet 0  . Multiple Vitamin (MULTIVITAMIN) capsule daily.    Marland Kitchen nystatin (MYCOSTATIN/NYSTOP) powder Apply 1 application topically 3 (three) times daily. 30 g 1  . oxyCODONE (OXY IR/ROXICODONE) 5 MG immediate release tablet 1-2 tablets every 4 hours as needed 120 tablet 0  . Polyethylene Glycol 3350 (MIRALAX PO) Take 1 Dose by mouth daily as needed.    . sertraline (ZOLOFT) 100 MG tablet TAKE ONE TABLET BY MOUTH EVERY DAY    . traZODone (DESYREL) 150 MG tablet TAKE ONE TABLET BY MOUTH AT BEDTIME     No current facility-administered medications for this visit.    VITAL SIGNS: BP 98/67   Pulse 93   Temp 98 F (36.7 C) (Oral)   Resp 16   LMP  (LMP Unknown)  There were no vitals filed for this visit.  Estimated body mass index is 37.43 kg/m as calculated from the following:   Height as of 09/19/20: _0  (1.676 m).   Weight as of 12/15/20: 231 lb 14.4 oz (105.2 kg).  LABS: CBC:    Component Value Date/Time   WBC 11.0 (H) 12/06/2020 1552   HGB 11.1 (L) 12/06/2020 1552   HGB 13.1 01/03/2012 1252   HCT 33.4 (L) 12/06/2020 1552   HCT 39.2 01/03/2012 1252   PLT 473 (H) 12/06/2020 1552   PLT 343 01/03/2012 1252   MCV 92.8 12/06/2020 1552   MCV 94 01/03/2012 1252   NEUTROABS 9.1 (H) 12/06/2020 1552   LYMPHSABS 0.9 12/06/2020 1552   MONOABS 0.5 12/06/2020 1552   EOSABS 0.4 12/06/2020 1552   BASOSABS 0.0 12/06/2020 1552   Comprehensive Metabolic Panel:    Component Value Date/Time   NA 135 12/15/2020 1203    NA 140 01/03/2012 1252   K 4.2 12/15/2020 1203   K 4.3 01/03/2012 1252   CL 100 12/15/2020 1203   CL 103 01/03/2012 1252   CO2 27 12/15/2020 1203   CO2 25 01/03/2012 1252   BUN 16 12/15/2020 1203   BUN 14 01/03/2012 1252   CREATININE 0.96 12/15/2020 1203   CREATININE 1.03 01/03/2012 1252   GLUCOSE 122 (H) 12/15/2020 1203   GLUCOSE 94 01/03/2012 1252   CALCIUM 8.9 12/15/2020 1203   CALCIUM 9.2 01/03/2012 1252   AST 21 12/15/2020 1203   ALT 7 12/15/2020 1203   ALKPHOS 88 12/15/2020 1203   BILITOT 0.5 12/15/2020 1203  PROT 7.1 12/15/2020 1203   ALBUMIN 3.8 12/15/2020 1203    RADIOGRAPHIC STUDIES: NM PET Image Initial (PI) Skull Base To Thigh  Result Date: 12/13/2020 CLINICAL DATA:  Initial treatment strategy for vulvar carcinoma. EXAM: NUCLEAR MEDICINE PET SKULL BASE TO THIGH TECHNIQUE: 11.8 mCi F-18 FDG was injected intravenously. Full-ring PET imaging was performed from the skull base to thigh after the radiotracer. CT data was obtained and used for attenuation correction and anatomic localization. Fasting blood glucose: 75 mg/dl COMPARISON:  None. FINDINGS: Mediastinal blood pool activity: SUV max 2.8 Liver activity: SUV max NA NECK: No hypermetabolic lymph nodes in the neck. Incidental CT findings: none CHEST: No hypermetabolic mediastinal or hilar nodes. No suspicious pulmonary nodules on the CT scan. Incidental CT findings: none ABDOMEN/PELVIS: Intense metabolic activity associated with the external genitalia with SUV max equal 23.3. No hypermetabolic inguinal nodes. No hypermetabolic iliac nodes. No hypermetabolic retroperitoneal nodes. No lymphadenopathy by CT imaging. No abnormal activity liver. Incidental CT findings: Postcholecystectomy. Atherosclerotic calcification of the aorta. Uterus and adnexa normal. SKELETON: No focal hypermetabolic activity to suggest skeletal metastasis. Incidental CT findings: none IMPRESSION: 1. Intense hypermetabolic activity associated with the  external genitalia consistent with primary vulvar carcinoma. 2. No evidence metastatic adenopathy in the pelvis. 3. No evidence visceral metastasis or skeletal metastasis Electronically Signed   By: Suzy Bouchard M.D.   On: 12/13/2020 13:53    PERFORMANCE STATUS (ECOG) : 2-3  Review of Systems Unless otherwise noted, a complete review of systems is negative.  Physical Exam General: NAD, frail appearing Pulmonary: Unlabored Extremities: no edema, no joint deformities Skin: no rashes Neurological: Weakness but otherwise nonfocal  IMPRESSION: I met with patient and daughter today in the clinic following their start of XRT.  Patient says that after talking with family, she has decided to forego chemotherapy. Apparently, her husband had cancer and family thought that chemotherapy ultimately caused him to decline and lose quality of life.  Symptomatically, patient endorses severe and persistent vulvar pain at the site of the cancer. She was squirming and uncomfortable appearing in the clinic. Patient has oxycodone ordered but says she is only taking it at bedtime. She does feel that it helps but was scared to take it more frequently. We talked about her pain regimen in detail. I encouraged her to liberalize frequency of use. We could consider starting her on a long-acting in the future but she is really not receiving enough oxycodone to justify it at this point.  We discussed constipation management in light of liberalizing her oxycodone. She currently has MiraLAX ordered at [facility]. We will also add senna daily as needed.  Patient was previously living at home alone but had frequent support from daughter and granddaughter. Patient was moved last week to SNF at [facility] due to declining performance status secondary to her Parkinson's. Apparently, patient was following daily and frequently had to call the fire department to get her off the floor.  Patient says that she has ACP documents  including healthcare power of attorney (daughter) and living will. Daughter will bring those in to the clinic to be scanned into her chart.  We discussed CODE STATUS. Daughter states that patient should remain a full code. I sent them home with a MOST form to review.  I would recommend the patient be followed at SNF by palliative care.  PLAN: -Continue current scope of treatment -Full code -Liberalize administration of oxycodone for pain -Daily MiraLAX -Add senna daily if needed -Daughter to bring  in ACP documents -Palliative care to follow at SNF  Case and plan discussed with Dr. Janese Banks   Patient expressed understanding and was in agreement with this plan. She also understands that She can call the clinic at any time with any questions, concerns, or complaints.     Time Total: 30 minutes  Visit consisted of counseling and education dealing with the complex and emotionally intense issues of symptom management and palliative care in the setting of serious and potentially life-threatening illness.Greater than 50%  of this time was spent counseling and coordinating care related to the above assessment and plan.  Signed by: Altha Harm, PhD, NP-C

## 2020-12-25 NOTE — Progress Notes (Signed)
Pt here to talk to palliative care, pt has been [facility] due to dx and her falling at home so many times

## 2020-12-25 NOTE — Telephone Encounter (Signed)
Message Received: Today Hannah Cook, RN  Devona Konig, [place] She has decided that she does not want chemo so we need to cancel the port and the covid test.  Sorry about short notice. She sent my chart message for this mroning   Thanks  sherry    Patient has been canceled.

## 2020-12-26 ENCOUNTER — Other Ambulatory Visit: Admission: RE | Admit: 2020-12-26 | Payer: Medicare Other | Source: Ambulatory Visit

## 2020-12-26 ENCOUNTER — Ambulatory Visit
Admission: RE | Admit: 2020-12-26 | Discharge: 2020-12-26 | Disposition: A | Discharge status: Home / Self Care | Payer: Medicare Other | Source: Ambulatory Visit | Attending: Radiation Oncology | Admitting: Radiation Oncology

## 2020-12-26 ENCOUNTER — Other Ambulatory Visit: Payer: Self-pay | Admitting: *Deleted

## 2020-12-26 DIAGNOSIS — Z510 Encounter for antineoplastic radiation therapy: Secondary | ICD-10-CM | POA: Diagnosis not present

## 2020-12-26 DIAGNOSIS — C519 Malignant neoplasm of vulva, unspecified: Secondary | ICD-10-CM | POA: Diagnosis present

## 2020-12-27 ENCOUNTER — Ambulatory Visit
Admission: RE | Admit: 2020-12-27 | Discharge: 2020-12-27 | Disposition: A | Discharge status: Home / Self Care | Payer: Medicare Other | Source: Ambulatory Visit | Attending: Radiation Oncology | Admitting: Radiation Oncology

## 2020-12-27 DIAGNOSIS — Z510 Encounter for antineoplastic radiation therapy: Secondary | ICD-10-CM | POA: Diagnosis not present

## 2020-12-28 ENCOUNTER — Other Ambulatory Visit: Payer: Self-pay

## 2020-12-28 ENCOUNTER — Encounter: Admission: RE | Payer: Self-pay | Source: Ambulatory Visit

## 2020-12-28 ENCOUNTER — Ambulatory Visit
Admission: RE | Admit: 2020-12-28 | Discharge: 2020-12-28 | Disposition: A | Discharge status: Home / Self Care | Payer: Medicare Other | Source: Ambulatory Visit | Attending: Radiation Oncology | Admitting: Radiation Oncology

## 2020-12-28 ENCOUNTER — Ambulatory Visit: Admission: RE | Admit: 2020-12-28 | Payer: Medicare Other | Source: Ambulatory Visit | Admitting: Vascular Surgery

## 2020-12-28 DIAGNOSIS — Z510 Encounter for antineoplastic radiation therapy: Secondary | ICD-10-CM | POA: Diagnosis not present

## 2020-12-28 DIAGNOSIS — C519 Malignant neoplasm of vulva, unspecified: Secondary | ICD-10-CM

## 2020-12-28 SURGERY — PORTA CATH INSERTION
Anesthesia: Moderate Sedation

## 2020-12-29 ENCOUNTER — Ambulatory Visit
Admission: RE | Admit: 2020-12-29 | Discharge: 2020-12-29 | Disposition: A | Discharge status: Home / Self Care | Payer: Medicare Other | Source: Ambulatory Visit | Attending: Radiation Oncology | Admitting: Radiation Oncology

## 2020-12-29 DIAGNOSIS — Z510 Encounter for antineoplastic radiation therapy: Secondary | ICD-10-CM | POA: Diagnosis not present

## 2021-01-01 ENCOUNTER — Ambulatory Visit
Admission: RE | Admit: 2021-01-01 | Discharge: 2021-01-01 | Disposition: A | Discharge status: Home / Self Care | Payer: Medicare Other | Source: Ambulatory Visit | Attending: Radiation Oncology | Admitting: Radiation Oncology

## 2021-01-01 DIAGNOSIS — Z510 Encounter for antineoplastic radiation therapy: Secondary | ICD-10-CM | POA: Diagnosis not present

## 2021-01-02 ENCOUNTER — Inpatient Hospital Stay (HOSPITAL_BASED_OUTPATIENT_CLINIC_OR_DEPARTMENT_OTHER): Payer: Medicare Other | Admitting: Oncology

## 2021-01-02 ENCOUNTER — Inpatient Hospital Stay: Payer: Medicare Other | Attending: Oncology

## 2021-01-02 ENCOUNTER — Ambulatory Visit
Admission: RE | Admit: 2021-01-02 | Discharge: 2021-01-02 | Disposition: A | Discharge status: Home / Self Care | Payer: Medicare Other | Source: Ambulatory Visit | Attending: Radiation Oncology | Admitting: Radiation Oncology

## 2021-01-02 ENCOUNTER — Telehealth: Payer: Self-pay | Admitting: Oncology

## 2021-01-02 ENCOUNTER — Encounter: Payer: Self-pay | Admitting: Oncology

## 2021-01-02 ENCOUNTER — Other Ambulatory Visit: Payer: Self-pay | Admitting: Hospice and Palliative Medicine

## 2021-01-02 VITALS — BP 132/62 | HR 92 | Temp 97.3°F | Resp 16 | Wt 230.0 lb

## 2021-01-02 DIAGNOSIS — C519 Malignant neoplasm of vulva, unspecified: Secondary | ICD-10-CM

## 2021-01-02 DIAGNOSIS — G893 Neoplasm related pain (acute) (chronic): Secondary | ICD-10-CM | POA: Insufficient documentation

## 2021-01-02 DIAGNOSIS — J449 Chronic obstructive pulmonary disease, unspecified: Secondary | ICD-10-CM | POA: Insufficient documentation

## 2021-01-02 DIAGNOSIS — F419 Anxiety disorder, unspecified: Secondary | ICD-10-CM | POA: Insufficient documentation

## 2021-01-02 DIAGNOSIS — Z7982 Long term (current) use of aspirin: Secondary | ICD-10-CM | POA: Insufficient documentation

## 2021-01-02 DIAGNOSIS — K589 Irritable bowel syndrome without diarrhea: Secondary | ICD-10-CM | POA: Insufficient documentation

## 2021-01-02 DIAGNOSIS — G20 Parkinson's disease: Secondary | ICD-10-CM | POA: Insufficient documentation

## 2021-01-02 DIAGNOSIS — Z87891 Personal history of nicotine dependence: Secondary | ICD-10-CM | POA: Insufficient documentation

## 2021-01-02 DIAGNOSIS — Z8249 Family history of ischemic heart disease and other diseases of the circulatory system: Secondary | ICD-10-CM | POA: Insufficient documentation

## 2021-01-02 DIAGNOSIS — F32A Depression, unspecified: Secondary | ICD-10-CM | POA: Insufficient documentation

## 2021-01-02 DIAGNOSIS — L598 Other specified disorders of the skin and subcutaneous tissue related to radiation: Secondary | ICD-10-CM | POA: Insufficient documentation

## 2021-01-02 DIAGNOSIS — K219 Gastro-esophageal reflux disease without esophagitis: Secondary | ICD-10-CM | POA: Insufficient documentation

## 2021-01-02 DIAGNOSIS — Z8051 Family history of malignant neoplasm of kidney: Secondary | ICD-10-CM | POA: Insufficient documentation

## 2021-01-02 DIAGNOSIS — Z79899 Other long term (current) drug therapy: Secondary | ICD-10-CM | POA: Insufficient documentation

## 2021-01-02 DIAGNOSIS — E785 Hyperlipidemia, unspecified: Secondary | ICD-10-CM | POA: Insufficient documentation

## 2021-01-02 DIAGNOSIS — I10 Essential (primary) hypertension: Secondary | ICD-10-CM | POA: Insufficient documentation

## 2021-01-02 DIAGNOSIS — Z510 Encounter for antineoplastic radiation therapy: Secondary | ICD-10-CM | POA: Diagnosis not present

## 2021-01-02 LAB — CBC WITH DIFFERENTIAL/PLATELET
Abs Immature Granulocytes: 0.08 10*3/uL — ABNORMAL HIGH (ref 0.00–0.07)
Basophils Absolute: 0.1 10*3/uL (ref 0.0–0.1)
Basophils Relative: 1 %
Eosinophils Absolute: 0.5 10*3/uL (ref 0.0–0.5)
Eosinophils Relative: 5 %
HCT: 31.5 % — ABNORMAL LOW (ref 36.0–46.0)
Hemoglobin: 10.4 g/dL — ABNORMAL LOW (ref 12.0–15.0)
Immature Granulocytes: 1 %
Lymphocytes Relative: 7 %
Lymphs Abs: 0.6 10*3/uL — ABNORMAL LOW (ref 0.7–4.0)
MCH: 30.9 pg (ref 26.0–34.0)
MCHC: 33 g/dL (ref 30.0–36.0)
MCV: 93.5 fL (ref 80.0–100.0)
Monocytes Absolute: 0.4 10*3/uL (ref 0.1–1.0)
Monocytes Relative: 5 %
Neutro Abs: 7 10*3/uL (ref 1.7–7.7)
Neutrophils Relative %: 81 %
Platelets: 465 10*3/uL — ABNORMAL HIGH (ref 150–400)
RBC: 3.37 MIL/uL — ABNORMAL LOW (ref 3.87–5.11)
RDW: 13.2 % (ref 11.5–15.5)
WBC: 8.6 10*3/uL (ref 4.0–10.5)
nRBC: 0 % (ref 0.0–0.2)

## 2021-01-02 LAB — COMPREHENSIVE METABOLIC PANEL
ALT: <5 U/L (ref 0–44)
AST: 13 U/L — ABNORMAL LOW (ref 15–41)
Albumin: 3.6 g/dL (ref 3.5–5.0)
Alkaline Phosphatase: 87 U/L (ref 38–126)
Anion gap: 10 (ref 5–15)
BUN: 23 mg/dL (ref 8–23)
CO2: 25 mmol/L (ref 22–32)
Calcium: 8.7 mg/dL — ABNORMAL LOW (ref 8.9–10.3)
Chloride: 100 mmol/L (ref 98–111)
Creatinine, Ser: 1.12 mg/dL — ABNORMAL HIGH (ref 0.44–1.00)
GFR, Estimated: 50 mL/min — ABNORMAL LOW (ref >60)
Glucose, Bld: 99 mg/dL (ref 70–99)
Potassium: 4.3 mmol/L (ref 3.5–5.1)
Sodium: 135 mmol/L (ref 135–145)
Total Bilirubin: 0.6 mg/dL (ref 0.3–1.2)
Total Protein: 6.8 g/dL (ref 6.5–8.1)

## 2021-01-02 MED ORDER — FENTANYL 12 MCG/HR TD PT72: 1.0000 | MEDICATED_PATCH | TRANSDERMAL | 0 refills | Status: DC

## 2021-01-02 NOTE — Telephone Encounter (Signed)
Left VM with pt to confirm appts on 2/22.  

## 2021-01-02 NOTE — Progress Notes (Signed)
Pt having pain at vulvar and bleeding due to radation

## 2021-01-02 NOTE — Progress Notes (Signed)
Will start patient on TD fentanyl per Dr. Elroy Channel request. Rx printed and will be sent to [facility].

## 2021-01-03 ENCOUNTER — Ambulatory Visit
Admission: RE | Admit: 2021-01-03 | Discharge: 2021-01-03 | Disposition: A | Discharge status: Home / Self Care | Payer: Medicare Other | Source: Ambulatory Visit | Attending: Radiation Oncology | Admitting: Radiation Oncology

## 2021-01-03 ENCOUNTER — Other Ambulatory Visit: Payer: Medicare Other

## 2021-01-03 ENCOUNTER — Other Ambulatory Visit: Payer: Self-pay | Admitting: *Deleted

## 2021-01-03 DIAGNOSIS — C519 Malignant neoplasm of vulva, unspecified: Secondary | ICD-10-CM

## 2021-01-03 DIAGNOSIS — Z510 Encounter for antineoplastic radiation therapy: Secondary | ICD-10-CM | POA: Diagnosis not present

## 2021-01-03 MED ORDER — SILVER SULFADIAZINE 1 % EX CREA: 1.0000 "application " | TOPICAL_CREAM | Freq: Two times a day (BID) | CUTANEOUS | 2 refills | Status: DC

## 2021-01-03 NOTE — Progress Notes (Signed)
Tumor Board Documentation  Hannah Howard was presented by Mariea Clonts, RN at our Tumor Board on 01/03/2021, which included representatives from medical oncology,radiation oncology,surgical oncology,radiology,pathology,navigation,genetics.  Hannah Howard currently presents as a new patient,for new tumor(s),for MDC with history of the following treatments: none. All specimens were positive for tumor.   Additionally, we reviewed previous medical and familial history, history of present illness, and recent lab results along with all available histopathologic and imaging studies. The tumor board considered available treatment options and made the following recommendations: Felt not to be a surgical candidate. Discussed recommendation for concurrent chemotherapy and radiation. Patient was felt not to be a candidate for cisplatin. Discussed carbo-taxol and radiation. Patient declined chemotherapy. Discussed plan for WPRT targeting lymph nodes, and primary tumor. She has started treatment and appears to be responding well.  Concurrent chemo-radiation therapy   The following procedures/referrals were also placed: No orders of the defined types were placed in this encounter.  Clinical Trial Status: not discussed   Staging used:    National site-specific guidelines   were discussed with respect to the case.  Tumor board is a meeting of clinicians from various specialty areas who evaluate and discuss patients for whom a multidisciplinary approach is being considered. Final determinations in the plan of care are those of the provider(s). The responsibility for follow up of recommendations given during tumor board is that of the provider.   Today's extended care, comprehensive team conference, Hannah Howard was not present for the discussion and was not examined.

## 2021-01-04 ENCOUNTER — Ambulatory Visit
Admission: RE | Admit: 2021-01-04 | Discharge: 2021-01-04 | Disposition: A | Discharge status: Home / Self Care | Payer: Medicare Other | Source: Ambulatory Visit | Attending: Radiation Oncology | Admitting: Radiation Oncology

## 2021-01-04 DIAGNOSIS — Z510 Encounter for antineoplastic radiation therapy: Secondary | ICD-10-CM | POA: Diagnosis not present

## 2021-01-05 ENCOUNTER — Ambulatory Visit
Admission: RE | Admit: 2021-01-05 | Discharge: 2021-01-05 | Disposition: A | Discharge status: Home / Self Care | Payer: Medicare Other | Source: Ambulatory Visit | Attending: Radiation Oncology | Admitting: Radiation Oncology

## 2021-01-05 DIAGNOSIS — Z510 Encounter for antineoplastic radiation therapy: Secondary | ICD-10-CM | POA: Diagnosis not present

## 2021-01-07 NOTE — Progress Notes (Signed)
Hematology/Oncology Consult note 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  She does not use any home oxygen.  She lives alone and her daughter and grandchildren watch over her care.  Patient has been complaining of labial pain and swelling and was referred to Dr. Ouida Sills by Dr. Doy Hutching.  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  She was also evaluated by GYN oncology and the tumor was large fungating and ulcerated measuring at least 7 cm involving the labia mid bulbar urethra and encroaching the vagina margin.  There was a concern for possible right inguinal lymph node.  PET CT scan showed no evidence of adenopathy or distant metastatic disease.  Intense hypermetabolic activity in the external genitalia with an SUV of 23.3.  Patient was not deemed to be a surgical candidate and referred for concurrent chemoradiation  Risks and benefits of chemoradiation discussed with patient.  Patient chose to proceed with radiation alone.  She currently lives at an assisted living facility.  Interval history-patient continues to have vulvar pain and intermittent bleeding.  She has been using oxycodone as needed for her pain but states that it does not help her at all times.  She has now moved to an assisted living facility.  She reports regular bowel movements with bowel medications.  ECOG PS- 2-3 Pain scale- 5 Opioid associated constipation- no  Review of  systems- Review of Systems  Constitutional: Positive for malaise/fatigue. Negative for chills, fever and weight loss.  HENT: Negative for congestion, ear discharge and nosebleeds.   Eyes: Negative for blurred vision.  Respiratory: Negative for cough, hemoptysis, sputum production, shortness of breath and wheezing.   Cardiovascular: Negative for chest pain, palpitations, orthopnea and claudication.  Gastrointestinal: Negative for abdominal pain, blood in stool, constipation, diarrhea, heartburn, melena, nausea and vomiting.  Genitourinary: Negative for dysuria, flank pain, frequency, hematuria and urgency.       Vulvar pain and bleeding  Musculoskeletal: Negative for back pain, joint pain and myalgias.  Skin: Negative for rash.  Neurological: Negative for dizziness, tingling, focal weakness, seizures, weakness and headaches.  Endo/Heme/Allergies: Does not bruise/bleed easily.  Psychiatric/Behavioral: Negative for depression and suicidal ideas. The patient does not have insomnia.      Allergies  Allergen Reactions  . Amoxicillin Rash  . Amoxicillin-Pot Clavulanate Rash  . Bronopol Rash    Patch test proven Allergic Contact dermatitis to Bronopol in patients soap products Patch test positive allergic contact dermatitis to Bronopol in patients soap products     Past Medical History:  Diagnosis Date  . Anxiety   . Arthritis   . Cancer ([place])    vulvar cancer  . Complication of anesthesia    woke during on of her knee operations  . COPD (chronic obstructive pulmonary disease) ([place])   . DDD (degenerative disc disease), lumbar   . Depression   . Diverticulosis   . GERD (gastroesophageal reflux disease)   . Headache    23-3x/month  . History  of colonic polyps   . Hyperlipidemia   . Hypertension   . Irritable bowel syndrome   . Migraine headache   . Motion sickness    cars  . Parkinson's disease ([place])   . Parkinsonism ([place])   . Vertigo    1 episode, recently  . Vulvar cancer  ([place])   . Wears dentures    partial upper     Past Surgical History:  Procedure Laterality Date  . BREAST BIOPSY Left 1973   neg  . CATARACT EXTRACTION W/PHACO Right 09/19/2020   Procedure: CATARACT EXTRACTION PHACO AND INTRAOCULAR LENS PLACEMENT ([place]) RIGHT;  Surgeon: Birder Robson, MD;  Location: [place];  Service: Ophthalmology;  Laterality: Right;  13.20 1:12.3  . CATARACT EXTRACTION W/PHACO Left 10/10/2020   Procedure: CATARACT EXTRACTION PHACO AND INTRAOCULAR LENS PLACEMENT ([facility]) LEFT 8.86 00:51.5;  Surgeon: Birder Robson, MD;  Location: [place];  Service: Ophthalmology;  Laterality: Left;  . CHOLECYSTECTOMY    . COLONOSCOPY WITH PROPOFOL N/A 04/07/2020   Procedure: COLONOSCOPY WITH PROPOFOL;  Surgeon: Robert Bellow, MD;  Location: [facility];  Service: Endoscopy;  Laterality: N/A;  . KNEE ARTHROSCOPY Right 2010  . KNEE ARTHROSCOPY Left 2012  . TOTAL KNEE ARTHROPLASTY Right 10/18/2009  . TOTAL KNEE ARTHROPLASTY Left 10/08/2010  . TUBAL LIGATION      Social History   Socioeconomic History  . Marital status: Divorced    Spouse name: Not on file  . Number of children: Not on file  . Years of education: Not on file  . Highest education level: Not on file  Occupational History  . Not on file  Tobacco Use  . Smoking status: Former Smoker    Types: Cigarettes    Quit date: 07/02/2000    Years since quitting: 20.5  . Smokeless tobacco: Never Used  Vaping Use  . Vaping Use: Never used  Substance and Sexual Activity  . Alcohol use: No    Alcohol/week: 0.0 standard drinks  . Drug use: No  . Sexual activity: Not Currently  Other Topics Concern  . Not on file  Social History Narrative  . Not on file   Social Determinants of Health   Financial Resource Strain: Not on file  Food Insecurity: Not on file  Transportation Needs: Not on file  Physical Activity: Not on file  Stress: Not on file  Social Connections: Not on file   Intimate Partner Violence: Not on file    Family History  Problem Relation Age of Onset  . Heart disease Mother   . Renal cancer Father   . Breast cancer Neg Hx      Current Outpatient Medications:  .  carbidopa-levodopa (SINEMET CR) 50-200 MG tablet, Take 1 tablet by mouth at bedtime., Disp: , Rfl:  .  carbidopa-levodopa (SINEMET IR) 25-250 MG tablet, Take 1 tablet by mouth 3 (three) times daily., Disp: , Rfl:  .  celecoxib (CELEBREX) 200 MG capsule, TAKE 1 CAPSULE BY MOUTH EVERY DAY, Disp: , Rfl:  .  doxepin (SINEQUAN) 25 MG capsule, Take 25 mg by mouth at bedtime., Disp: , Rfl:  .  gabapentin (NEURONTIN) 100 MG capsule, Take 100 mg by mouth daily., Disp: , Rfl:  .  lansoprazole (PREVACID) 30 MG capsule, TAKE 1 CAPSULE BY MOUTH EVERY DAY, Disp: , Rfl:  .  meclizine (ANTIVERT) 25 MG tablet, Take 25 mg by mouth 3 (three) times daily as needed for dizziness., Disp: , Rfl:  .  Multiple Vitamin (MULTIVITAMIN) capsule,  daily., Disp: , Rfl:  .  nystatin (MYCOSTATIN/NYSTOP) powder, Apply 1 application topically 3 (three) times daily., Disp: 30 g, Rfl: 1 .  oxyCODONE (OXY IR/ROXICODONE) 5 MG immediate release tablet, 1-2 tablets every 4 hours as needed, Disp: 120 tablet, Rfl: 0 .  Polyethylene Glycol 3350 (MIRALAX PO), Take 1 Dose by mouth daily as needed., Disp: , Rfl:  .  sertraline (ZOLOFT) 100 MG tablet, TAKE ONE TABLET BY MOUTH EVERY DAY, Disp: , Rfl:  .  traZODone (DESYREL) 150 MG tablet, TAKE ONE TABLET BY MOUTH AT BEDTIME, Disp: , Rfl:  .  aspirin EC 81 MG tablet, Take 81 mg by mouth., Disp: , Rfl:  .  fentaNYL (DURAGESIC) 12 MCG/HR, Place 1 patch onto the skin every 3 (three) days., Disp: 10 patch, Rfl: 0 .  hydrOXYzine (ATARAX/VISTARIL) 50 MG tablet, TAKE ONE TABLET 3 TIMES DAILY AS NEEDED FOR ITCHING (Patient not taking: Reported on 01/02/2021), Disp: , Rfl:  .  silver sulfADIAZINE (SILVADENE) 1 % cream, Apply 1 application topically 2 (two) times daily., Disp: 85 g, Rfl:  2  Physical exam:  Vitals:   01/02/21 1211  BP: 132/62  Pulse: 92  Resp: 16  Temp: (!) 97.3 F (36.3 C)  TempSrc: Oral  Weight: 230 lb (104.3 kg)   Physical Exam Constitutional:      Comments: Elderly frail woman sitting in a wheelchair.  Eyes:     Extraocular Movements: EOM normal.     Pupils: Pupils are equal, round, and reactive to light.  Cardiovascular:     Rate and Rhythm: Normal rate and regular rhythm.     Heart sounds: Normal heart sounds.  Pulmonary:     Effort: Pulmonary effort is normal.     Breath sounds: Normal breath sounds.  Skin:    General: Skin is warm and dry.  Neurological:     Mental Status: She is alert and oriented to person, place, and time.      CMP Latest Ref Rng & Units 01/02/2021  Glucose 70 - 99 mg/dL 99  BUN 8 - 23 mg/dL 23  Creatinine 0.44 - 1.00 mg/dL 1.12(H)  Sodium 135 - 145 mmol/L 135  Potassium 3.5 - 5.1 mmol/L 4.3  Chloride 98 - 111 mmol/L 100  CO2 22 - 32 mmol/L 25  Calcium 8.9 - 10.3 mg/dL 8.7(L)  Total Protein 6.5 - 8.1 g/dL 6.8  Total Bilirubin 0.3 - 1.2 mg/dL 0.6  Alkaline Phos 38 - 126 U/L 87  AST 15 - 41 U/L 13(L)  ALT 0 - 44 U/L <5   CBC Latest Ref Rng & Units 01/02/2021  WBC 4.0 - 10.5 K/uL 8.6  Hemoglobin 12.0 - 15.0 g/dL 10.4(L)  Hematocrit 36.0 - 46.0 % 31.5(L)  Platelets 150 - 400 K/uL 465(H)    No images are attached to the encounter.  NM PET Image Initial (PI) Skull Base To Thigh  Result Date: 12/13/2020 CLINICAL DATA:  Initial treatment strategy for vulvar carcinoma. EXAM: NUCLEAR MEDICINE PET SKULL BASE TO THIGH TECHNIQUE: 11.8 mCi F-18 FDG was injected intravenously. Full-ring PET imaging was performed from the skull base to thigh after the radiotracer. CT data was obtained and used for attenuation correction and anatomic localization. Fasting blood glucose: 75 mg/dl COMPARISON:  None. FINDINGS: Mediastinal blood pool activity: SUV max 2.8 Liver activity: SUV max NA NECK: No hypermetabolic lymph nodes in the  neck. Incidental CT findings: none CHEST: No hypermetabolic mediastinal or hilar nodes. No suspicious pulmonary nodules on the CT scan.  Incidental CT findings: none ABDOMEN/PELVIS: Intense metabolic activity associated with the external genitalia with SUV max equal 23.3. No hypermetabolic inguinal nodes. No hypermetabolic iliac nodes. No hypermetabolic retroperitoneal nodes. No lymphadenopathy by CT imaging. No abnormal activity liver. Incidental CT findings: Postcholecystectomy. Atherosclerotic calcification of the aorta. Uterus and adnexa normal. SKELETON: No focal hypermetabolic activity to suggest skeletal metastasis. Incidental CT findings: none IMPRESSION: 1. Intense hypermetabolic activity associated with the external genitalia consistent with primary vulvar carcinoma. 2. No evidence metastatic adenopathy in the pelvis. 3. No evidence visceral metastasis or skeletal metastasis Electronically Signed   By: Suzy Bouchard M.D.   On: 12/13/2020 13:53     Assessment and plan- Patient is a 80 y.o. 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  She continues to have significant vulvar pain and intermittent bleeding from the mass.  I will therefore add a fentanyl patch 12 mcg to her pain regimen in addition to continuing her as needed oxycodone.  I will tentatively see her back in 2 weeks with labs CBC with differential and CMP   Visit Diagnosis 1. Vulvar cancer ([place])   2. Neoplasm related pain      Dr. Randa Evens, MD, MPH [facility] at [facility] [phone] 01/07/2021 6:50 PM

## 2021-01-08 ENCOUNTER — Ambulatory Visit
Admission: RE | Admit: 2021-01-08 | Discharge: 2021-01-08 | Disposition: A | Discharge status: Home / Self Care | Payer: Medicare Other | Source: Ambulatory Visit | Attending: Radiation Oncology | Admitting: Radiation Oncology

## 2021-01-08 DIAGNOSIS — Z510 Encounter for antineoplastic radiation therapy: Secondary | ICD-10-CM | POA: Diagnosis not present

## 2021-01-09 ENCOUNTER — Other Ambulatory Visit: Payer: Self-pay | Admitting: Licensed Clinical Social Worker

## 2021-01-09 ENCOUNTER — Ambulatory Visit
Admission: RE | Admit: 2021-01-09 | Discharge: 2021-01-09 | Disposition: A | Discharge status: Home / Self Care | Payer: Medicare Other | Source: Ambulatory Visit | Attending: Radiation Oncology | Admitting: Radiation Oncology

## 2021-01-09 DIAGNOSIS — Z510 Encounter for antineoplastic radiation therapy: Secondary | ICD-10-CM | POA: Diagnosis not present

## 2021-01-09 NOTE — Telephone Encounter (Signed)
Ok to increase fentanyl patch to 25 mcg

## 2021-01-10 ENCOUNTER — Ambulatory Visit
Admission: RE | Admit: 2021-01-10 | Discharge: 2021-01-10 | Disposition: A | Discharge status: Home / Self Care | Payer: Medicare Other | Source: Ambulatory Visit | Attending: Radiation Oncology | Admitting: Radiation Oncology

## 2021-01-10 DIAGNOSIS — Z510 Encounter for antineoplastic radiation therapy: Secondary | ICD-10-CM | POA: Diagnosis not present

## 2021-01-11 ENCOUNTER — Ambulatory Visit
Admission: RE | Admit: 2021-01-11 | Discharge: 2021-01-11 | Disposition: A | Discharge status: Home / Self Care | Payer: Medicare Other | Source: Ambulatory Visit | Attending: Radiation Oncology | Admitting: Radiation Oncology

## 2021-01-11 DIAGNOSIS — Z510 Encounter for antineoplastic radiation therapy: Secondary | ICD-10-CM | POA: Diagnosis not present

## 2021-01-12 ENCOUNTER — Ambulatory Visit
Admission: RE | Admit: 2021-01-12 | Discharge: 2021-01-12 | Disposition: A | Discharge status: Home / Self Care | Payer: Medicare Other | Source: Ambulatory Visit | Attending: Radiation Oncology | Admitting: Radiation Oncology

## 2021-01-12 DIAGNOSIS — Z510 Encounter for antineoplastic radiation therapy: Secondary | ICD-10-CM | POA: Diagnosis not present

## 2021-01-15 ENCOUNTER — Ambulatory Visit: Payer: Medicare Other

## 2021-01-16 ENCOUNTER — Inpatient Hospital Stay (HOSPITAL_BASED_OUTPATIENT_CLINIC_OR_DEPARTMENT_OTHER): Payer: Medicare Other | Admitting: Oncology

## 2021-01-16 ENCOUNTER — Other Ambulatory Visit: Payer: Self-pay

## 2021-01-16 ENCOUNTER — Ambulatory Visit: Payer: Medicare Other

## 2021-01-16 ENCOUNTER — Ambulatory Visit: Admission: RE | Admit: 2021-01-16 | Payer: Medicare Other | Source: Ambulatory Visit

## 2021-01-16 ENCOUNTER — Inpatient Hospital Stay: Payer: Medicare Other

## 2021-01-16 ENCOUNTER — Encounter: Payer: Self-pay | Admitting: Oncology

## 2021-01-16 VITALS — BP 103/35 | HR 92 | Temp 98.1°F | Resp 18 | Wt 230.0 lb

## 2021-01-16 DIAGNOSIS — K589 Irritable bowel syndrome without diarrhea: Secondary | ICD-10-CM | POA: Diagnosis not present

## 2021-01-16 DIAGNOSIS — Z79899 Other long term (current) drug therapy: Secondary | ICD-10-CM | POA: Diagnosis not present

## 2021-01-16 DIAGNOSIS — F419 Anxiety disorder, unspecified: Secondary | ICD-10-CM | POA: Diagnosis not present

## 2021-01-16 DIAGNOSIS — Z8249 Family history of ischemic heart disease and other diseases of the circulatory system: Secondary | ICD-10-CM | POA: Diagnosis not present

## 2021-01-16 DIAGNOSIS — I10 Essential (primary) hypertension: Secondary | ICD-10-CM | POA: Diagnosis not present

## 2021-01-16 DIAGNOSIS — Z87891 Personal history of nicotine dependence: Secondary | ICD-10-CM | POA: Diagnosis not present

## 2021-01-16 DIAGNOSIS — F32A Depression, unspecified: Secondary | ICD-10-CM | POA: Diagnosis not present

## 2021-01-16 DIAGNOSIS — G893 Neoplasm related pain (acute) (chronic): Secondary | ICD-10-CM

## 2021-01-16 DIAGNOSIS — L589 Radiodermatitis, unspecified: Secondary | ICD-10-CM | POA: Diagnosis not present

## 2021-01-16 DIAGNOSIS — K219 Gastro-esophageal reflux disease without esophagitis: Secondary | ICD-10-CM | POA: Diagnosis not present

## 2021-01-16 DIAGNOSIS — L598 Other specified disorders of the skin and subcutaneous tissue related to radiation: Secondary | ICD-10-CM | POA: Diagnosis not present

## 2021-01-16 DIAGNOSIS — E785 Hyperlipidemia, unspecified: Secondary | ICD-10-CM | POA: Diagnosis not present

## 2021-01-16 DIAGNOSIS — C519 Malignant neoplasm of vulva, unspecified: Secondary | ICD-10-CM | POA: Diagnosis present

## 2021-01-16 DIAGNOSIS — Z7982 Long term (current) use of aspirin: Secondary | ICD-10-CM | POA: Diagnosis not present

## 2021-01-16 DIAGNOSIS — G20 Parkinson's disease: Secondary | ICD-10-CM | POA: Diagnosis not present

## 2021-01-16 DIAGNOSIS — J449 Chronic obstructive pulmonary disease, unspecified: Secondary | ICD-10-CM | POA: Diagnosis not present

## 2021-01-16 DIAGNOSIS — Z8051 Family history of malignant neoplasm of kidney: Secondary | ICD-10-CM | POA: Diagnosis not present

## 2021-01-16 LAB — COMPREHENSIVE METABOLIC PANEL
ALT: <5 U/L (ref 0–44)
AST: 15 U/L (ref 15–41)
Albumin: 3.7 g/dL (ref 3.5–5.0)
Alkaline Phosphatase: 73 U/L (ref 38–126)
Anion gap: 13 (ref 5–15)
BUN: 17 mg/dL (ref 8–23)
CO2: 25 mmol/L (ref 22–32)
Calcium: 9.1 mg/dL (ref 8.9–10.3)
Chloride: 100 mmol/L (ref 98–111)
Creatinine, Ser: 1.22 mg/dL — ABNORMAL HIGH (ref 0.44–1.00)
GFR, Estimated: 45 mL/min — ABNORMAL LOW (ref >60)
Glucose, Bld: 116 mg/dL — ABNORMAL HIGH (ref 70–99)
Potassium: 3.7 mmol/L (ref 3.5–5.1)
Sodium: 138 mmol/L (ref 135–145)
Total Bilirubin: 0.6 mg/dL (ref 0.3–1.2)
Total Protein: 6.6 g/dL (ref 6.5–8.1)

## 2021-01-16 LAB — CBC WITH DIFFERENTIAL/PLATELET
Abs Immature Granulocytes: 0.05 10*3/uL (ref 0.00–0.07)
Basophils Absolute: 0 10*3/uL (ref 0.0–0.1)
Basophils Relative: 0 %
Eosinophils Absolute: 0.6 10*3/uL — ABNORMAL HIGH (ref 0.0–0.5)
Eosinophils Relative: 6 %
HCT: 31.3 % — ABNORMAL LOW (ref 36.0–46.0)
Hemoglobin: 10.3 g/dL — ABNORMAL LOW (ref 12.0–15.0)
Immature Granulocytes: 1 %
Lymphocytes Relative: 4 %
Lymphs Abs: 0.4 10*3/uL — ABNORMAL LOW (ref 0.7–4.0)
MCH: 31.5 pg (ref 26.0–34.0)
MCHC: 32.9 g/dL (ref 30.0–36.0)
MCV: 95.7 fL (ref 80.0–100.0)
Monocytes Absolute: 0.6 10*3/uL (ref 0.1–1.0)
Monocytes Relative: 6 %
Neutro Abs: 7.9 10*3/uL — ABNORMAL HIGH (ref 1.7–7.7)
Neutrophils Relative %: 83 %
Platelets: 336 10*3/uL (ref 150–400)
RBC: 3.27 MIL/uL — ABNORMAL LOW (ref 3.87–5.11)
RDW: 13.6 % (ref 11.5–15.5)
WBC: 9.6 10*3/uL (ref 4.0–10.5)
nRBC: 0 % (ref 0.0–0.2)

## 2021-01-16 NOTE — Progress Notes (Signed)
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 She does not use any home oxygen. She lives alone and her daughter and grandchildren watch over her care. Patient has been complaining of labial pain and swelling and was referred to Dr. Ouida Sills by Dr. Doy Hutching. 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 She was also evaluated by GYN oncology and the tumor was large fungating and ulcerated measuring at least 7 cm involving the labia mid bulbar urethra and encroaching the vagina margin. There was a concern for possible right inguinal lymph node. PET CT scan showed no evidence of adenopathy or distant metastatic disease. Intense hypermetabolic activity in the external genitalia with an SUV of 23.3. Patient was not deemed to be a surgical candidate and referred for concurrent chemoradiation  Risks and benefits of chemoradiation discussed with patient.  Patient chose to proceed with radiation alone.  She currently lives at an assisted living facility.  Interval history-patient reports significant burning in her groin and vulvar area since the start of radiation.  She is presently on 25 mcg fentanyl patch and as needed oxycodone 5 mg every 6 hours as needed.  She feels that the pain is not well controlled with this regimen.  ECOG PS- 3 Pain scale- 5 Opioid associated constipation-  no  Review of systems- Review of Systems  Constitutional: Negative for chills, fever, malaise/fatigue and weight loss.  HENT: Negative for congestion, ear discharge and nosebleeds.   Eyes: Negative for blurred vision.  Respiratory: Negative for cough, hemoptysis, sputum production, shortness of breath and wheezing.   Cardiovascular: Negative for chest pain, palpitations, orthopnea and claudication.  Gastrointestinal: Negative for abdominal pain, blood in stool, constipation, diarrhea, heartburn, melena, nausea and vomiting.  Genitourinary: Negative for dysuria, flank pain, frequency, hematuria and urgency.       Vulvar pain  Musculoskeletal: Negative for back pain, joint pain and myalgias.  Skin: Negative for rash.  Neurological: Negative for dizziness, tingling, focal weakness, seizures, weakness and headaches.  Endo/Heme/Allergies: Does not bruise/bleed easily.  Psychiatric/Behavioral: Negative for depression and suicidal ideas. The patient does not have insomnia.       Allergies  Allergen Reactions  . Amoxicillin Rash  . Amoxicillin-Pot Clavulanate Rash  . Bronopol Rash    Patch test proven Allergic Contact dermatitis to Bronopol in patients soap products Patch test positive allergic contact dermatitis to Bronopol in patients soap products     Past Medical History:  Diagnosis Date  . Anxiety   . Arthritis   . Cancer ([place])    vulvar cancer  . Complication of anesthesia    woke during on of her knee operations  . COPD (chronic obstructive pulmonary disease) ([place])   . DDD (degenerative disc disease), lumbar   . Depression   . Diverticulosis   . GERD (gastroesophageal reflux disease)   . Headache    23-3x/month  . History of colonic polyps   . Hyperlipidemia   . Hypertension   .  Irritable bowel syndrome   . Migraine headache   . Motion sickness    cars  . Parkinson's disease ([place])   . Parkinsonism ([place])   . Vertigo    1 episode, recently  . Vulvar cancer ([place])    . Wears dentures    partial upper     Past Surgical History:  Procedure Laterality Date  . BREAST BIOPSY Left 1973   neg  . CATARACT EXTRACTION W/PHACO Right 09/19/2020   Procedure: CATARACT EXTRACTION PHACO AND INTRAOCULAR LENS PLACEMENT ([place]) RIGHT;  Surgeon: Birder Robson, MD;  Location: [place];  Service: Ophthalmology;  Laterality: Right;  13.20 1:12.3  . CATARACT EXTRACTION W/PHACO Left 10/10/2020   Procedure: CATARACT EXTRACTION PHACO AND INTRAOCULAR LENS PLACEMENT ([facility]) LEFT 8.86 00:51.5;  Surgeon: Birder Robson, MD;  Location: [place];  Service: Ophthalmology;  Laterality: Left;  . CHOLECYSTECTOMY    . COLONOSCOPY WITH PROPOFOL N/A 04/07/2020   Procedure: COLONOSCOPY WITH PROPOFOL;  Surgeon: Robert Bellow, MD;  Location: [facility];  Service: Endoscopy;  Laterality: N/A;  . KNEE ARTHROSCOPY Right 2010  . KNEE ARTHROSCOPY Left 2012  . TOTAL KNEE ARTHROPLASTY Right 10/18/2009  . TOTAL KNEE ARTHROPLASTY Left 10/08/2010  . TUBAL LIGATION      Social History   Socioeconomic History  . Marital status: Divorced    Spouse name: Not on file  . Number of children: Not on file  . Years of education: Not on file  . Highest education level: Not on file  Occupational History  . Not on file  Tobacco Use  . Smoking status: Former Smoker    Types: Cigarettes    Quit date: 07/02/2000    Years since quitting: 20.5  . Smokeless tobacco: Never Used  Vaping Use  . Vaping Use: Never used  Substance and Sexual Activity  . Alcohol use: No    Alcohol/week: 0.0 standard drinks  . Drug use: No  . Sexual activity: Not Currently  Other Topics Concern  . Not on file  Social History Narrative  . Not on file   Social Determinants of Health   Financial Resource Strain: Not on file  Food Insecurity: Not on file  Transportation Needs: Not on file  Physical Activity: Not on file  Stress: Not on file  Social Connections: Not on file  Intimate  Partner Violence: Not on file    Family History  Problem Relation Age of Onset  . Heart disease Mother   . Renal cancer Father   . Breast cancer Neg Hx      Current Outpatient Medications:  .  carbidopa-levodopa (SINEMET CR) 50-200 MG tablet, Take 1 tablet by mouth at bedtime., Disp: , Rfl:  .  carbidopa-levodopa (SINEMET IR) 25-250 MG tablet, Take 1 tablet by mouth 3 (three) times daily., Disp: , Rfl:  .  celecoxib (CELEBREX) 200 MG capsule, TAKE 1 CAPSULE BY MOUTH EVERY DAY, Disp: , Rfl:  .  doxepin (SINEQUAN) 25 MG capsule, Take 25 mg by mouth at bedtime., Disp: , Rfl:  .  fentaNYL (DURAGESIC) 12 MCG/HR, Place 1 patch onto the skin every 3 (three) days., Disp: 10 patch, Rfl: 0 .  gabapentin (NEURONTIN) 100 MG capsule, Take 100 mg by mouth daily., Disp: , Rfl:  .  hydrOXYzine (ATARAX/VISTARIL) 50 MG tablet, , Disp: , Rfl:  .  lansoprazole (PREVACID) 30 MG capsule, TAKE 1 CAPSULE BY MOUTH EVERY DAY, Disp: , Rfl:  .  Multiple Vitamin (MULTIVITAMIN) capsule, daily., Disp: , Rfl:  .  nystatin (MYCOSTATIN/NYSTOP) powder, Apply 1 application topically 3 (three) times daily., Disp: 30 g, Rfl: 1 .  oxyCODONE (OXY IR/ROXICODONE) 5 MG immediate release tablet, 1-2 tablets every 4 hours as needed, Disp: 120 tablet, Rfl: 0 .  Polyethylene Glycol 3350 (MIRALAX PO), Take 1 Dose by mouth daily as needed., Disp: , Rfl:  .  sertraline (ZOLOFT) 100 MG tablet, TAKE ONE TABLET BY MOUTH EVERY DAY, Disp: , Rfl:  .  silver sulfADIAZINE (SILVADENE) 1 % cream, Apply 1 application topically 2 (two) times daily., Disp: 85 g, Rfl: 2 .  traZODone (DESYREL) 150 MG tablet, TAKE ONE TABLET BY MOUTH AT BEDTIME, Disp: , Rfl:  .  aspirin EC 81 MG tablet, Take 81 mg by mouth., Disp: , Rfl:  .  meclizine (ANTIVERT) 25 MG tablet, Take 25 mg by mouth 3 (three) times daily as needed for dizziness., Disp: , Rfl:   Physical exam:  Vitals:   01/16/21 0926  BP: (!) 103/35  Pulse: 92  Resp: 18  Temp: 98.1 F (36.7 C)   TempSrc: Tympanic  SpO2: 97%  Weight: 230 lb (104.3 kg)   Physical Exam Constitutional:      General: She is not in acute distress.    Comments: Sitting in a wheelchair.  Appears in no acute distress  Eyes:     Extraocular Movements: EOM normal.  Pulmonary:     Effort: Pulmonary effort is normal.  Genitourinary:    Comments: There is significant erythema noted in the inner aspects of bilateral thighs.  Erythema noted around the labia majora and minora.  Further exam could not be done as patient was uncomfortable and could not stand Skin:    General: Skin is warm and dry.  Neurological:     Mental Status: She is alert and oriented to person, place, and time.      CMP Latest Ref Rng & Units 01/16/2021  Glucose 70 - 99 mg/dL 116(H)  BUN 8 - 23 mg/dL 17  Creatinine 0.44 - 1.00 mg/dL 1.22(H)  Sodium 135 - 145 mmol/L 138  Potassium 3.5 - 5.1 mmol/L 3.7  Chloride 98 - 111 mmol/L 100  CO2 22 - 32 mmol/L 25  Calcium 8.9 - 10.3 mg/dL 9.1  Total Protein 6.5 - 8.1 g/dL 6.6  Total Bilirubin 0.3 - 1.2 mg/dL 0.6  Alkaline Phos 38 - 126 U/L 73  AST 15 - 41 U/L 15  ALT 0 - 44 U/L <5   CBC Latest Ref Rng & Units 01/16/2021  WBC 4.0 - 10.5 K/uL 9.6  Hemoglobin 12.0 - 15.0 g/dL 10.3(L)  Hematocrit 36.0 - 46.0 % 31.3(L)  Platelets 150 - 400 K/uL 336     Assessment and plan- Patient is a 80 y.o. 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  She is currently undergoing radiation therapy and is here for routine follow-up  Neoplasm related pain: Suspect patient's pain is secondary to radiation dermatitis as well as pain from the vulvar mass itself.  I have asked her to increase her oxycodone dose from 5 to 10 mg every 4 hours as needed and continue fentanyl patch at 25 mcg.  A significant component of the pain is also from radiation dermatitis and likely inflammatory in etiology.  I have asked her to continue as needed Tylenol and okay to take Motrin 400 mg 1-2 doses a day as needed.  She has  3 more weeks of radiation left and likely pain will be an issue for the next 6  to 7 weeks.  She will be seen by NP Beckey Rutter in 1 week and I will tentatively see her back in 3 weeks from now   Visit Diagnosis 1. Neoplasm related pain   2. Radiation dermatitis      Dr. Randa Evens, MD, MPH [facility] at [facility] [phone] 01/16/2021 11:58 AM

## 2021-01-17 ENCOUNTER — Ambulatory Visit: Payer: Medicare Other

## 2021-01-18 ENCOUNTER — Ambulatory Visit: Payer: Medicare Other

## 2021-01-19 ENCOUNTER — Ambulatory Visit: Payer: Medicare Other

## 2021-01-22 ENCOUNTER — Ambulatory Visit
Admission: RE | Admit: 2021-01-22 | Discharge: 2021-01-22 | Disposition: A | Discharge status: Home / Self Care | Payer: Medicare Other | Source: Ambulatory Visit | Attending: Radiation Oncology | Admitting: Radiation Oncology

## 2021-01-22 DIAGNOSIS — Z510 Encounter for antineoplastic radiation therapy: Secondary | ICD-10-CM | POA: Diagnosis not present

## 2021-01-23 ENCOUNTER — Inpatient Hospital Stay (HOSPITAL_BASED_OUTPATIENT_CLINIC_OR_DEPARTMENT_OTHER): Payer: Medicare Other | Admitting: Nurse Practitioner

## 2021-01-23 ENCOUNTER — Ambulatory Visit: Payer: Medicare Other

## 2021-01-23 VITALS — BP 109/48 | HR 95 | Temp 98.7°F | Resp 17

## 2021-01-23 DIAGNOSIS — L589 Radiodermatitis, unspecified: Secondary | ICD-10-CM | POA: Diagnosis not present

## 2021-01-23 DIAGNOSIS — G893 Neoplasm related pain (acute) (chronic): Secondary | ICD-10-CM | POA: Insufficient documentation

## 2021-01-23 DIAGNOSIS — Z87891 Personal history of nicotine dependence: Secondary | ICD-10-CM | POA: Insufficient documentation

## 2021-01-23 DIAGNOSIS — C519 Malignant neoplasm of vulva, unspecified: Secondary | ICD-10-CM | POA: Insufficient documentation

## 2021-01-23 DIAGNOSIS — Z7982 Long term (current) use of aspirin: Secondary | ICD-10-CM | POA: Insufficient documentation

## 2021-01-23 DIAGNOSIS — I10 Essential (primary) hypertension: Secondary | ICD-10-CM | POA: Insufficient documentation

## 2021-01-23 DIAGNOSIS — Z510 Encounter for antineoplastic radiation therapy: Secondary | ICD-10-CM | POA: Diagnosis not present

## 2021-01-23 DIAGNOSIS — Z79899 Other long term (current) drug therapy: Secondary | ICD-10-CM | POA: Insufficient documentation

## 2021-01-23 DIAGNOSIS — L598 Other specified disorders of the skin and subcutaneous tissue related to radiation: Secondary | ICD-10-CM | POA: Insufficient documentation

## 2021-01-23 DIAGNOSIS — Z923 Personal history of irradiation: Secondary | ICD-10-CM | POA: Insufficient documentation

## 2021-01-23 DIAGNOSIS — Z9221 Personal history of antineoplastic chemotherapy: Secondary | ICD-10-CM | POA: Insufficient documentation

## 2021-01-23 NOTE — Progress Notes (Signed)
Symptom Management [place]  [phone] [phone]  Patient Care Team: Idelle Crouch, MD as PCP - General (Internal Medicine)   Name of the patient: Hannah Howard  [id number]  Apr 19, 1941   Date of visit: 01/23/21  Diagnosis-vulvar cancer  Chief complaint/ Reason for visit- vulvar pain & dermatitis  Heme/Onc history:  Oncology History  Vulvar cancer ([place])  12/15/2020 Initial Diagnosis   Vulvar cancer ([place])   12/15/2020 Cancer Staging   Staging form: Vulva, AJCC 8th Edition - Clinical stage from 12/15/2020: FIGO Stage II (cT2, cN0, cM0) - Signed by Sindy Guadeloupe, MD on 12/15/2020   12/18/2020 -  Chemotherapy    Patient is on Treatment Plan: VULVAR CARCINOMA CARBOPLATIN/PACLITAXEL WEEKLY X 6 WEEKS WITH XRT          Interval history-patient is 80 year old female 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  She was seen by medical oncology last week and complained of vulvar pain which was thought to be secondary to radiation as well as pain from the vulvar mass itself.  Oxycodone was increased.  She continues fentanyl patch.  She is alternating Tylenol and Motrin.  She presents to symptom management clinic today for reevaluation of her pain. Today pain has improved. Using SSD cream as recommended by Dr. Baruch Gouty. She rates her pain as 8/10 (previously 10/10).   Review of systems- Review of Systems  Constitutional: Positive for malaise/fatigue. Negative for chills, diaphoresis, fever and weight loss.  HENT: Negative for congestion, ear discharge, ear pain, hearing loss, nosebleeds, sinus pain, sore throat and tinnitus.   Eyes: Negative for blurred vision, double vision, pain and redness.  Respiratory: Negative for cough, hemoptysis, sputum production, shortness of breath, wheezing and stridor.   Cardiovascular: Negative for chest pain, palpitations and leg swelling.  Gastrointestinal: Negative for abdominal pain,  blood in stool, constipation, diarrhea, melena, nausea and vomiting.  Genitourinary: Positive for frequency (chronic). Negative for dysuria and urgency.       Incontinence  Musculoskeletal: Positive for joint pain. Negative for back pain, falls and myalgias.       Wheelchair  Skin: Negative for itching and rash.  Neurological: Negative for dizziness, tingling, sensory change, loss of consciousness, weakness and headaches.  Endo/Heme/Allergies: Negative for environmental allergies. Does not bruise/bleed easily.  Psychiatric/Behavioral: Negative for depression. The patient is not nervous/anxious and does not have insomnia.   All other systems reviewed and are negative.     Allergies  Allergen Reactions  . Amoxicillin Rash  . Amoxicillin-Pot Clavulanate Rash  . Bronopol Rash    Patch test proven Allergic Contact dermatitis to Bronopol in patients soap products Patch test positive allergic contact dermatitis to Bronopol in patients soap products    Past Medical History:  Diagnosis Date  . Anxiety   . Arthritis   . Cancer ([place])    vulvar cancer  . Complication of anesthesia    woke during on of her knee operations  . COPD (chronic obstructive pulmonary disease) ([place])   . DDD (degenerative disc disease), lumbar   . Depression   . Diverticulosis   . GERD (gastroesophageal reflux disease)   . Headache    23-3x/month  . History of colonic polyps   . Hyperlipidemia   . Hypertension   . Irritable bowel syndrome   . Migraine headache   . Motion sickness    cars  . Parkinson's disease ([place])   . Parkinsonism ([place])   . Vertigo    1  episode, recently  . Vulvar cancer ([place])   . Wears dentures    partial upper    Past Surgical History:  Procedure Laterality Date  . BREAST BIOPSY Left 1973   neg  . CATARACT EXTRACTION W/PHACO Right 09/19/2020   Procedure: CATARACT EXTRACTION PHACO AND INTRAOCULAR LENS PLACEMENT ([place]) RIGHT;  Surgeon: Birder Robson, MD;  Location: [place];  Service: Ophthalmology;  Laterality: Right;  13.20 1:12.3  . CATARACT EXTRACTION W/PHACO Left 10/10/2020   Procedure: CATARACT EXTRACTION PHACO AND INTRAOCULAR LENS PLACEMENT ([facility]) LEFT 8.86 00:51.5;  Surgeon: Birder Robson, MD;  Location: [place];  Service: Ophthalmology;  Laterality: Left;  . CHOLECYSTECTOMY    . COLONOSCOPY WITH PROPOFOL N/A 04/07/2020   Procedure: COLONOSCOPY WITH PROPOFOL;  Surgeon: Robert Bellow, MD;  Location: [facility];  Service: Endoscopy;  Laterality: N/A;  . KNEE ARTHROSCOPY Right 2010  . KNEE ARTHROSCOPY Left 2012  . TOTAL KNEE ARTHROPLASTY Right 10/18/2009  . TOTAL KNEE ARTHROPLASTY Left 10/08/2010  . TUBAL LIGATION      Social History   Socioeconomic History  . Marital status: Divorced    Spouse name: Not on file  . Number of children: Not on file  . Years of education: Not on file  . Highest education level: Not on file  Occupational History  . Not on file  Tobacco Use  . Smoking status: Former Smoker    Types: Cigarettes    Quit date: 07/02/2000    Years since quitting: 20.5  . Smokeless tobacco: Never Used  Vaping Use  . Vaping Use: Never used  Substance and Sexual Activity  . Alcohol use: No    Alcohol/week: 0.0 standard drinks  . Drug use: No  . Sexual activity: Not Currently  Other Topics Concern  . Not on file  Social History Narrative  . Not on file   Social Determinants of Health   Financial Resource Strain: Not on file  Food Insecurity: Not on file  Transportation Needs: Not on file  Physical Activity: Not on file  Stress: Not on file  Social Connections: Not on file  Intimate Partner Violence: Not on file    Family History  Problem Relation Age of Onset  . Heart disease Mother   . Renal cancer Father   . Breast cancer Neg Hx      Current Outpatient Medications:  .  aspirin EC 81 MG tablet, Take 81 mg by mouth., Disp: , Rfl:  .  carbidopa-levodopa (SINEMET CR) 50-200 MG  tablet, Take 1 tablet by mouth at bedtime., Disp: , Rfl:  .  carbidopa-levodopa (SINEMET IR) 25-250 MG tablet, Take 1 tablet by mouth 3 (three) times daily., Disp: , Rfl:  .  celecoxib (CELEBREX) 200 MG capsule, TAKE 1 CAPSULE BY MOUTH EVERY DAY, Disp: , Rfl:  .  doxepin (SINEQUAN) 25 MG capsule, Take 25 mg by mouth at bedtime., Disp: , Rfl:  .  fentaNYL (DURAGESIC) 12 MCG/HR, Place 1 patch onto the skin every 3 (three) days., Disp: 10 patch, Rfl: 0 .  gabapentin (NEURONTIN) 100 MG capsule, Take 100 mg by mouth daily., Disp: , Rfl:  .  hydrOXYzine (ATARAX/VISTARIL) 50 MG tablet, , Disp: , Rfl:  .  lansoprazole (PREVACID) 30 MG capsule, TAKE 1 CAPSULE BY MOUTH EVERY DAY, Disp: , Rfl:  .  meclizine (ANTIVERT) 25 MG tablet, Take 25 mg by mouth 3 (three) times daily as needed for dizziness., Disp: , Rfl:  .  Multiple Vitamin (MULTIVITAMIN) capsule, daily.,  Disp: , Rfl:  .  nystatin (MYCOSTATIN/NYSTOP) powder, Apply 1 application topically 3 (three) times daily., Disp: 30 g, Rfl: 1 .  oxyCODONE (OXY IR/ROXICODONE) 5 MG immediate release tablet, 1-2 tablets every 4 hours as needed, Disp: 120 tablet, Rfl: 0 .  Polyethylene Glycol 3350 (MIRALAX PO), Take 1 Dose by mouth daily as needed., Disp: , Rfl:  .  sertraline (ZOLOFT) 100 MG tablet, TAKE ONE TABLET BY MOUTH EVERY DAY, Disp: , Rfl:  .  silver sulfADIAZINE (SILVADENE) 1 % cream, Apply 1 application topically 2 (two) times daily., Disp: 85 g, Rfl: 2 .  traZODone (DESYREL) 150 MG tablet, TAKE ONE TABLET BY MOUTH AT BEDTIME, Disp: , Rfl:   Physical exam:  Vitals:   01/23/21 0956  BP: (!) 109/48  Pulse: 95  Resp: 17  Temp: 98.7 F (37.1 C)  TempSrc: Tympanic  SpO2: 98%   Physical Exam Exam conducted with a chaperone present.  Constitutional:      Appearance: She is well-developed and well-nourished. She is obese.     Comments: Frail appearing; in wheelchair  HENT:     Head: Atraumatic.     Nose: Nose normal.     Mouth/Throat:     Mouth:  Oropharynx is clear and moist.     Pharynx: No oropharyngeal exudate.  Eyes:     General: No scleral icterus. Cardiovascular:     Rate and Rhythm: Normal rate and regular rhythm.  Pulmonary:     Effort: Pulmonary effort is normal. No respiratory distress.  Abdominal:     General: There is no distension.     Palpations: Abdomen is soft.  Genitourinary:    Comments: Patient unable to get on table for exam. Examined while standing and sitting which limited exam. Labia diffusely reddened bilaterally. Reddening of skin folds of upper thigh. Urinary leakage.  Musculoskeletal:        General: No edema.  Skin:    General: Skin is warm and dry.  Neurological:     Mental Status: She is alert and oriented to person, place, and time.  Psychiatric:        Mood and Affect: Mood and affect and mood normal.        Behavior: Behavior normal.      CMP Latest Ref Rng & Units 01/16/2021  Glucose 70 - 99 mg/dL 116(H)  BUN 8 - 23 mg/dL 17  Creatinine 0.44 - 1.00 mg/dL 1.22(H)  Sodium 135 - 145 mmol/L 138  Potassium 3.5 - 5.1 mmol/L 3.7  Chloride 98 - 111 mmol/L 100  CO2 22 - 32 mmol/L 25  Calcium 8.9 - 10.3 mg/dL 9.1  Total Protein 6.5 - 8.1 g/dL 6.6  Total Bilirubin 0.3 - 1.2 mg/dL 0.6  Alkaline Phos 38 - 126 U/L 73  AST 15 - 41 U/L 15  ALT 0 - 44 U/L <5   CBC Latest Ref Rng & Units 01/16/2021  WBC 4.0 - 10.5 K/uL 9.6  Hemoglobin 12.0 - 15.0 g/dL 10.3(L)  Hematocrit 36.0 - 46.0 % 31.3(L)  Platelets 150 - 400 K/uL 336    No images are attached to the encounter.  No results found.  Assessment and plan- Patient is a 80 y.o. female diagnosed with vulvar cancer currently undergoing radiation who presents to [facility] for radiation dermatitis and vulvar pain secondary to malignancy.   1. Pain due to malignancy- continue fentanyl patch as prescribed. Encouraged use of oxycodone q4h prn. Ok to alternate with tylenol. Can use dermaplast  topical numbing spray for short term  relief.   2. Radiation dermatitis- encouraged good hygiene & air circulation. Provided patient with peri bottle. SSD to vulva as directed. Aquaphor for protection. Symptoms likely to persist through radiation.   Return to Symptom Management Clinic in 1 week for re-evaluation and to monitor pain.    Visit Diagnosis 1. Cancer associated pain   2. Radiation dermatitis     Patient expressed understanding and was in agreement with this plan. She also understands that She can call clinic at any time with any questions, concerns, or complaints.   Thank you for allowing me to participate in the care of this very pleasant patient.   Beckey Rutter, DNP, AGNP-C [facility] [place]

## 2021-01-24 ENCOUNTER — Ambulatory Visit
Admission: RE | Admit: 2021-01-24 | Discharge: 2021-01-24 | Disposition: A | Discharge status: Home / Self Care | Payer: Medicare Other | Source: Ambulatory Visit | Attending: Radiation Oncology | Admitting: Radiation Oncology

## 2021-01-24 DIAGNOSIS — Z510 Encounter for antineoplastic radiation therapy: Secondary | ICD-10-CM | POA: Diagnosis not present

## 2021-01-24 DIAGNOSIS — C519 Malignant neoplasm of vulva, unspecified: Secondary | ICD-10-CM | POA: Insufficient documentation

## 2021-01-25 ENCOUNTER — Ambulatory Visit
Admission: RE | Admit: 2021-01-25 | Discharge: 2021-01-25 | Disposition: A | Discharge status: Home / Self Care | Payer: Medicare Other | Source: Ambulatory Visit | Attending: Radiation Oncology | Admitting: Radiation Oncology

## 2021-01-25 DIAGNOSIS — Z510 Encounter for antineoplastic radiation therapy: Secondary | ICD-10-CM | POA: Diagnosis not present

## 2021-01-26 ENCOUNTER — Ambulatory Visit
Admission: RE | Admit: 2021-01-26 | Discharge: 2021-01-26 | Disposition: A | Discharge status: Home / Self Care | Payer: Medicare Other | Source: Ambulatory Visit | Attending: Radiation Oncology | Admitting: Radiation Oncology

## 2021-01-26 DIAGNOSIS — Z510 Encounter for antineoplastic radiation therapy: Secondary | ICD-10-CM | POA: Diagnosis not present

## 2021-01-29 ENCOUNTER — Ambulatory Visit
Admission: RE | Admit: 2021-01-29 | Discharge: 2021-01-29 | Disposition: A | Discharge status: Home / Self Care | Payer: Medicare Other | Source: Ambulatory Visit | Attending: Radiation Oncology | Admitting: Radiation Oncology

## 2021-01-29 DIAGNOSIS — Z510 Encounter for antineoplastic radiation therapy: Secondary | ICD-10-CM | POA: Diagnosis not present

## 2021-01-30 ENCOUNTER — Ambulatory Visit
Admission: RE | Admit: 2021-01-30 | Discharge: 2021-01-30 | Disposition: A | Discharge status: Home / Self Care | Payer: Medicare Other | Source: Ambulatory Visit | Attending: Radiation Oncology | Admitting: Radiation Oncology

## 2021-01-30 ENCOUNTER — Inpatient Hospital Stay (HOSPITAL_BASED_OUTPATIENT_CLINIC_OR_DEPARTMENT_OTHER): Payer: Medicare Other | Admitting: Nurse Practitioner

## 2021-01-30 VITALS — BP 108/36 | HR 80 | Temp 99.2°F | Resp 18

## 2021-01-30 DIAGNOSIS — G893 Neoplasm related pain (acute) (chronic): Secondary | ICD-10-CM

## 2021-01-30 DIAGNOSIS — Z510 Encounter for antineoplastic radiation therapy: Secondary | ICD-10-CM | POA: Diagnosis not present

## 2021-01-31 ENCOUNTER — Ambulatory Visit
Admission: RE | Admit: 2021-01-31 | Discharge: 2021-01-31 | Disposition: A | Discharge status: Home / Self Care | Payer: Medicare Other | Source: Ambulatory Visit | Attending: Radiation Oncology | Admitting: Radiation Oncology

## 2021-01-31 DIAGNOSIS — Z510 Encounter for antineoplastic radiation therapy: Secondary | ICD-10-CM | POA: Diagnosis not present

## 2021-02-01 ENCOUNTER — Ambulatory Visit
Admission: RE | Admit: 2021-02-01 | Discharge: 2021-02-01 | Disposition: A | Discharge status: Home / Self Care | Payer: Medicare Other | Source: Ambulatory Visit | Attending: Radiation Oncology | Admitting: Radiation Oncology

## 2021-02-01 DIAGNOSIS — Z510 Encounter for antineoplastic radiation therapy: Secondary | ICD-10-CM | POA: Diagnosis not present

## 2021-02-02 ENCOUNTER — Ambulatory Visit
Admission: RE | Admit: 2021-02-02 | Discharge: 2021-02-02 | Disposition: A | Discharge status: Home / Self Care | Payer: Medicare Other | Source: Ambulatory Visit | Attending: Radiation Oncology | Admitting: Radiation Oncology

## 2021-02-02 DIAGNOSIS — Z510 Encounter for antineoplastic radiation therapy: Secondary | ICD-10-CM | POA: Diagnosis not present

## 2021-02-05 ENCOUNTER — Ambulatory Visit: Payer: Medicare Other

## 2021-02-05 ENCOUNTER — Ambulatory Visit
Admission: RE | Admit: 2021-02-05 | Discharge: 2021-02-05 | Disposition: A | Discharge status: Home / Self Care | Payer: Medicare Other | Source: Ambulatory Visit | Attending: Radiation Oncology | Admitting: Radiation Oncology

## 2021-02-05 DIAGNOSIS — Z510 Encounter for antineoplastic radiation therapy: Secondary | ICD-10-CM | POA: Diagnosis not present

## 2021-02-06 ENCOUNTER — Ambulatory Visit: Payer: Medicare Other

## 2021-02-06 ENCOUNTER — Ambulatory Visit
Admission: RE | Admit: 2021-02-06 | Discharge: 2021-02-06 | Disposition: A | Discharge status: Home / Self Care | Payer: Medicare Other | Source: Ambulatory Visit | Attending: Radiation Oncology | Admitting: Radiation Oncology

## 2021-02-06 DIAGNOSIS — Z510 Encounter for antineoplastic radiation therapy: Secondary | ICD-10-CM | POA: Diagnosis not present

## 2021-02-07 ENCOUNTER — Ambulatory Visit: Payer: Medicare Other

## 2021-02-07 ENCOUNTER — Ambulatory Visit
Admission: RE | Admit: 2021-02-07 | Discharge: 2021-02-07 | Disposition: A | Discharge status: Home / Self Care | Payer: Medicare Other | Source: Ambulatory Visit | Attending: Radiation Oncology | Admitting: Radiation Oncology

## 2021-02-07 DIAGNOSIS — Z510 Encounter for antineoplastic radiation therapy: Secondary | ICD-10-CM | POA: Diagnosis not present

## 2021-02-08 ENCOUNTER — Ambulatory Visit
Admission: RE | Admit: 2021-02-08 | Discharge: 2021-02-08 | Disposition: A | Discharge status: Home / Self Care | Payer: Medicare Other | Source: Ambulatory Visit | Attending: Radiation Oncology | Admitting: Radiation Oncology

## 2021-02-08 ENCOUNTER — Ambulatory Visit: Payer: Medicare Other

## 2021-02-08 DIAGNOSIS — Z510 Encounter for antineoplastic radiation therapy: Secondary | ICD-10-CM | POA: Diagnosis not present

## 2021-02-09 ENCOUNTER — Ambulatory Visit: Payer: Medicare Other

## 2021-02-09 ENCOUNTER — Ambulatory Visit
Admission: RE | Admit: 2021-02-09 | Discharge: 2021-02-09 | Disposition: A | Discharge status: Home / Self Care | Payer: Medicare Other | Source: Ambulatory Visit | Attending: Radiation Oncology | Admitting: Radiation Oncology

## 2021-02-09 DIAGNOSIS — Z510 Encounter for antineoplastic radiation therapy: Secondary | ICD-10-CM | POA: Diagnosis not present

## 2021-02-12 ENCOUNTER — Ambulatory Visit
Admission: RE | Admit: 2021-02-12 | Discharge: 2021-02-12 | Disposition: A | Discharge status: Home / Self Care | Payer: Medicare Other | Source: Ambulatory Visit | Attending: Radiation Oncology | Admitting: Radiation Oncology

## 2021-02-12 DIAGNOSIS — Z510 Encounter for antineoplastic radiation therapy: Secondary | ICD-10-CM | POA: Diagnosis not present

## 2021-02-13 ENCOUNTER — Ambulatory Visit
Admission: RE | Admit: 2021-02-13 | Discharge: 2021-02-13 | Disposition: A | Discharge status: Home / Self Care | Payer: Medicare Other | Source: Ambulatory Visit | Attending: Radiation Oncology | Admitting: Radiation Oncology

## 2021-02-13 DIAGNOSIS — Z510 Encounter for antineoplastic radiation therapy: Secondary | ICD-10-CM | POA: Diagnosis not present

## 2021-02-14 ENCOUNTER — Ambulatory Visit
Admission: RE | Admit: 2021-02-14 | Discharge: 2021-02-14 | Disposition: A | Discharge status: Home / Self Care | Payer: Medicare Other | Source: Ambulatory Visit | Attending: Radiation Oncology | Admitting: Radiation Oncology

## 2021-02-14 DIAGNOSIS — Z510 Encounter for antineoplastic radiation therapy: Secondary | ICD-10-CM | POA: Diagnosis not present

## 2021-02-15 ENCOUNTER — Ambulatory Visit: Payer: Medicare Other

## 2021-02-15 ENCOUNTER — Ambulatory Visit
Admission: RE | Admit: 2021-02-15 | Discharge: 2021-02-15 | Disposition: A | Discharge status: Home / Self Care | Payer: Medicare Other | Source: Ambulatory Visit | Attending: Radiation Oncology | Admitting: Radiation Oncology

## 2021-02-15 DIAGNOSIS — Z510 Encounter for antineoplastic radiation therapy: Secondary | ICD-10-CM | POA: Diagnosis not present

## 2021-02-16 ENCOUNTER — Ambulatory Visit: Payer: Medicare Other

## 2021-02-16 ENCOUNTER — Ambulatory Visit
Admission: RE | Admit: 2021-02-16 | Discharge: 2021-02-16 | Disposition: A | Discharge status: Home / Self Care | Payer: Medicare Other | Source: Ambulatory Visit | Attending: Radiation Oncology | Admitting: Radiation Oncology

## 2021-02-16 DIAGNOSIS — Z510 Encounter for antineoplastic radiation therapy: Secondary | ICD-10-CM | POA: Diagnosis not present

## 2021-03-04 ENCOUNTER — Other Ambulatory Visit: Payer: Self-pay | Admitting: Dermatology

## 2021-03-14 ENCOUNTER — Inpatient Hospital Stay: Payer: Medicare Other | Attending: Obstetrics and Gynecology | Admitting: Obstetrics and Gynecology

## 2021-03-14 ENCOUNTER — Telehealth: Payer: Self-pay | Admitting: Nurse Practitioner

## 2021-03-14 ENCOUNTER — Other Ambulatory Visit: Payer: Self-pay | Admitting: *Deleted

## 2021-03-14 ENCOUNTER — Inpatient Hospital Stay: Payer: Medicare Other

## 2021-03-14 VITALS — BP 105/44 | HR 98 | Temp 98.8°F | Resp 18 | Wt 210.3 lb

## 2021-03-14 DIAGNOSIS — I959 Hypotension, unspecified: Secondary | ICD-10-CM | POA: Insufficient documentation

## 2021-03-14 DIAGNOSIS — L598 Other specified disorders of the skin and subcutaneous tissue related to radiation: Secondary | ICD-10-CM | POA: Insufficient documentation

## 2021-03-14 DIAGNOSIS — Z9181 History of falling: Secondary | ICD-10-CM | POA: Diagnosis not present

## 2021-03-14 DIAGNOSIS — F32A Depression, unspecified: Secondary | ICD-10-CM | POA: Insufficient documentation

## 2021-03-14 DIAGNOSIS — C519 Malignant neoplasm of vulva, unspecified: Secondary | ICD-10-CM | POA: Insufficient documentation

## 2021-03-14 DIAGNOSIS — G893 Neoplasm related pain (acute) (chronic): Secondary | ICD-10-CM | POA: Diagnosis not present

## 2021-03-14 DIAGNOSIS — M7918 Myalgia, other site: Secondary | ICD-10-CM | POA: Diagnosis not present

## 2021-03-14 DIAGNOSIS — F419 Anxiety disorder, unspecified: Secondary | ICD-10-CM | POA: Diagnosis not present

## 2021-03-14 DIAGNOSIS — R7989 Other specified abnormal findings of blood chemistry: Secondary | ICD-10-CM | POA: Diagnosis not present

## 2021-03-14 DIAGNOSIS — Z87891 Personal history of nicotine dependence: Secondary | ICD-10-CM | POA: Insufficient documentation

## 2021-03-14 DIAGNOSIS — Z79899 Other long term (current) drug therapy: Secondary | ICD-10-CM | POA: Insufficient documentation

## 2021-03-14 DIAGNOSIS — G20 Parkinson's disease: Secondary | ICD-10-CM | POA: Diagnosis not present

## 2021-03-14 DIAGNOSIS — Z791 Long term (current) use of non-steroidal anti-inflammatories (NSAID): Secondary | ICD-10-CM | POA: Insufficient documentation

## 2021-03-14 DIAGNOSIS — D649 Anemia, unspecified: Secondary | ICD-10-CM | POA: Diagnosis not present

## 2021-03-14 DIAGNOSIS — M1611 Unilateral primary osteoarthritis, right hip: Secondary | ICD-10-CM | POA: Insufficient documentation

## 2021-03-14 DIAGNOSIS — I10 Essential (primary) hypertension: Secondary | ICD-10-CM | POA: Diagnosis not present

## 2021-03-14 DIAGNOSIS — R531 Weakness: Secondary | ICD-10-CM | POA: Insufficient documentation

## 2021-03-14 DIAGNOSIS — D72829 Elevated white blood cell count, unspecified: Secondary | ICD-10-CM | POA: Diagnosis not present

## 2021-03-14 DIAGNOSIS — D75839 Thrombocytosis, unspecified: Secondary | ICD-10-CM | POA: Insufficient documentation

## 2021-03-14 DIAGNOSIS — E785 Hyperlipidemia, unspecified: Secondary | ICD-10-CM | POA: Insufficient documentation

## 2021-03-14 DIAGNOSIS — R54 Age-related physical debility: Secondary | ICD-10-CM | POA: Insufficient documentation

## 2021-03-14 DIAGNOSIS — J449 Chronic obstructive pulmonary disease, unspecified: Secondary | ICD-10-CM | POA: Diagnosis not present

## 2021-03-14 LAB — COMPREHENSIVE METABOLIC PANEL
ALT: 8 U/L (ref 0–44)
AST: 17 U/L (ref 15–41)
Albumin: 3.2 g/dL — ABNORMAL LOW (ref 3.5–5.0)
Alkaline Phosphatase: 91 U/L (ref 38–126)
Anion gap: 11 (ref 5–15)
BUN: 19 mg/dL (ref 8–23)
CO2: 25 mmol/L (ref 22–32)
Calcium: 8.6 mg/dL — ABNORMAL LOW (ref 8.9–10.3)
Chloride: 98 mmol/L (ref 98–111)
Creatinine, Ser: 1.38 mg/dL — ABNORMAL HIGH (ref 0.44–1.00)
GFR, Estimated: 39 mL/min — ABNORMAL LOW (ref >60)
Glucose, Bld: 130 mg/dL — ABNORMAL HIGH (ref 70–99)
Potassium: 4.4 mmol/L (ref 3.5–5.1)
Sodium: 134 mmol/L — ABNORMAL LOW (ref 135–145)
Total Bilirubin: 0.5 mg/dL (ref 0.3–1.2)
Total Protein: 6.9 g/dL (ref 6.5–8.1)

## 2021-03-14 LAB — CBC
HCT: 29.5 % — ABNORMAL LOW (ref 36.0–46.0)
Hemoglobin: 9.4 g/dL — ABNORMAL LOW (ref 12.0–15.0)
MCH: 30.7 pg (ref 26.0–34.0)
MCHC: 31.9 g/dL (ref 30.0–36.0)
MCV: 96.4 fL (ref 80.0–100.0)
Platelets: 523 10*3/uL — ABNORMAL HIGH (ref 150–400)
RBC: 3.06 MIL/uL — ABNORMAL LOW (ref 3.87–5.11)
RDW: 15.9 % — ABNORMAL HIGH (ref 11.5–15.5)
WBC: 12.3 10*3/uL — ABNORMAL HIGH (ref 4.0–10.5)
nRBC: 0 % (ref 0.0–0.2)

## 2021-03-14 MED ORDER — SILVER SULFADIAZINE 1 % EX CREA: 1.0000 "application " | TOPICAL_CREAM | Freq: Three times a day (TID) | CUTANEOUS | 2 refills | Status: DC

## 2021-03-14 NOTE — Telephone Encounter (Signed)
Left vm for patient's daughter. Bloodwork reviewed. Dr. Theora Gianotti recommends re-evaluation with pelvic exam and cbc next week to recheck vulvar wound. If worse or unchanged, add flagyl. They can call back to schedule.

## 2021-03-14 NOTE — Progress Notes (Signed)
Gynecologic Oncology Interval Visit   Referring Provider: Boykin Nearing, MD [address] [facility] [place],  [place] [address] [phone]  Chief Concern: Vulvar cancer  Subjective:  Hannah Howard is a 80 y.o. G32P2 female who is seen in consultation from Dr. Ouida Sills for vulvar cancer.   She completed WPRT and vulvar radiation to 66 Gy with IMRT 12/26/2020-02/16/2021. She had radiation dermatitis and cancer-related pain symptoms.   Dr. Janese Banks recommended weekly paclitaxel/carboplatin but Ms. Gales declined chemotherapy and opted for radiation only.   She has significant vulvar pain, weakness. She has had falls recently. She continues to use peribottle for hygiene and has been applying aquaphor cream. She resides at a facility. Denies injury.   Gynecologic Oncology History Hannah Howard is a pleasant G12P2 female who is seen in consultation from Dr. Ouida Sills for vulvar cancer.  Please see prior notes for complete details.   She was seen by Dr. Ouida Sills on 11/27/2020 and exam revealed external genitalia: right labia minora lesion that was very edematous with necrotic edges. 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   12/06/2020 On exam she was found to have a large ulcerative and fungating 7 x 6 cm vulvar tumor involving the right labium, mid vulva, and urethra and encroaching the vaginal margin. There appears to be another focus of vulvar tumor involving the left labium majora. Cervix: unable to perform a speculum exam due to patient discomfort. Unable to palpate the cervix.  Vagina: unable to perform a speculum exam due to patient discomfort. On palpation no  lesions appreciated.  Uterus: Unable to palpate due to habitus.  Recommendation was for chemoradiation and she was seen by Dr. Janese Banks and Dr. Baruch Gouty  12/23/2020 PET scan IMPRESSION: 1. Intense hypermetabolic activity associated with the external genitalia consistent with primary vulvar carcinoma. 2. No evidence metastatic adenopathy in the pelvis. 3. No evidence visceral metastasis or skeletal metastasis  Other medical issues: The patient reports that she quit smoking about 22 years ago. She has a 20.00 pack-year smoking history. She has multiple medical problems including Parkinson's disease and COPD. She uses a walker at home and arrived to clinic in a wheel chair. She is very sedentary and does not think she can walk a block. She does not go up stairs.    Problem List: Patient Active Problem List   Diagnosis Date Noted  . Goals of care, counseling/discussion 12/15/2020  . 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 Primary osteoarthritis of right hip 04/02/2020  . Pain due to onychomycosis of toenails of both feet 12/09/2019  . Chronic generalized pain 02/05/2018  . Atypical chest pain 09/05/2017  . Dizzy spells 09/05/2017  . SOB (shortness of breath) 09/05/2017  . 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/23/2016  . Stiffness of shoulder joint, left 04/09/2016  . Obesity (BMI 30-39.9) 01/24/2016  . Atrophic vaginitis 07/03/2015  . Urinary frequency 07/03/2015  . Urge incontinence 07/03/2015  . Anxiety and depression 07/08/2014  . BP (high blood pressure) 07/08/2014  . Hyperlipidemia 07/08/2014  . Idiopathic Parkinson's disease ([place]) 07/08/2014  . Parkinson disease ([place]) 07/08/2014  . HTN (hypertension) 07/08/2014  . Disc disease, degenerative, lumbar or lumbosacral 04/04/2014  . Neuritis or radiculitis due to rupture  of lumbar intervertebral disc 04/04/2014  . Lumbar radiculitis 04/04/2014    Past Medical History: Past Medical History:  Diagnosis Date  . Anxiety    . Arthritis   . Cancer ([place])    vulvar cancer  . Complication of anesthesia    woke during on of her knee operations  . COPD (chronic obstructive pulmonary disease) ([place])   . DDD (degenerative disc disease), lumbar   . Depression   . Diverticulosis   . GERD (gastroesophageal reflux disease)   . Headache    23-3x/month  . History of colonic polyps   . Hyperlipidemia   . Hypertension   . Irritable bowel syndrome   . Migraine headache   . Motion sickness    cars  . Parkinson's disease ([place])   . Parkinsonism ([place])   . Vertigo    1 episode, recently  . Vulvar cancer ([place])   . Wears dentures    partial upper    Past Surgical History: Past Surgical History:  Procedure Laterality Date  . BREAST BIOPSY Left 1973   neg  . CATARACT EXTRACTION W/PHACO Right 09/19/2020   Procedure: CATARACT EXTRACTION PHACO AND INTRAOCULAR LENS PLACEMENT ([place]) RIGHT;  Surgeon: Birder Robson, MD;  Location: [facility];  Service: Ophthalmology;  Laterality: Right;  13.20 1:12.3  . CATARACT EXTRACTION W/PHACO Left 10/10/2020   Procedure: CATARACT EXTRACTION PHACO AND INTRAOCULAR LENS PLACEMENT ([facility]) LEFT 8.86 00:51.5;  Surgeon: Birder Robson, MD;  Location: [facility];  Service: Ophthalmology;  Laterality: Left;  . CHOLECYSTECTOMY    . COLONOSCOPY WITH PROPOFOL N/A 04/07/2020   Procedure: COLONOSCOPY WITH PROPOFOL;  Surgeon: Robert Bellow, MD;  Location: [facility];  Service: Endoscopy;  Laterality: N/A;  . KNEE ARTHROSCOPY Right 2010  . KNEE ARTHROSCOPY Left 2012  . TOTAL KNEE ARTHROPLASTY Right 10/18/2009  . TOTAL KNEE ARTHROPLASTY Left 10/08/2010  . TUBAL LIGATION       Past Gynecologic History:  Menarche: 17 Last Menstrual Period: unknown; postmenopausal History of Abnormal pap: unknown Last pap: unknown   OB History  Gravida Para Term Preterm AB Living  2 2          SAB IAB Ectopic Multiple Live Births               # Outcome Date GA Lbr Len/2nd  Weight Sex Delivery Anes PTL Lv  2 Para           1 Para             Family History: Family History  Problem Relation Age of Onset  . Heart disease Mother   . Renal cancer Father   . Breast cancer Neg Hx     Social History: Social History   Socioeconomic History  . Marital status: Divorced    Spouse name: Not on file  . Number of children: Not on file  . Years of education: Not on file  . Highest education level: Not on file  Occupational History  . Not on file  Tobacco Use  . Smoking status: Former Smoker    Types: Cigarettes    Quit date: 07/02/2000    Years since quitting: 20.7  . Smokeless tobacco: Never Used  Vaping Use  . Vaping Use: Never used  Substance and Sexual Activity  . Alcohol use: No    Alcohol/week: 0.0 standard drinks  . Drug use: No  . Sexual activity: Not Currently  Other Topics Concern  . Not on file  Social History Narrative  .  Not on file   Social Determinants of Health   Financial Resource Strain: Not on file  Food Insecurity: Not on file  Transportation Needs: Not on file  Physical Activity: Not on file  Stress: Not on file  Social Connections: Not on file  Intimate Partner Violence: Not on file    Allergies: Allergies  Allergen Reactions  . Amoxicillin Rash  . Amoxicillin-Pot Clavulanate Rash  . Bronopol Rash    Patch test proven Allergic Contact dermatitis to Bronopol in patients soap products Patch test positive allergic contact dermatitis to Bronopol in patients soap products    Current Medications: Current Outpatient Medications  Medication Sig Dispense Refill  . carbidopa-levodopa (SINEMET CR) 50-200 MG tablet Take 1 tablet by mouth at bedtime.    . carbidopa-levodopa (SINEMET IR) 25-250 MG tablet Take 1 tablet by mouth 3 (three) times daily.    . celecoxib (CELEBREX) 200 MG capsule TAKE 1 CAPSULE BY MOUTH EVERY DAY    . doxepin (SINEQUAN) 25 MG capsule Take 25 mg by mouth at bedtime.    . fentaNYL (DURAGESIC) 25  MCG/HR 1 patch every 3 (three) days.    Marland Kitchen gabapentin (NEURONTIN) 100 MG capsule Take 100 mg by mouth daily.    Marland Kitchen ibuprofen (ADVIL) 400 MG tablet Take 400 mg by mouth daily. PRN    . lansoprazole (PREVACID) 30 MG capsule TAKE 1 CAPSULE BY MOUTH EVERY DAY    . montelukast (SINGULAIR) 10 MG tablet TAKE ONE TABLET EVERY DAY 30 tablet 3  . oxyCODONE (OXY IR/ROXICODONE) 5 MG immediate release tablet 1-2 tablets every 4 hours as needed 120 tablet 0  . Polyethylene Glycol 3350 (MIRALAX PO) Take 1 Dose by mouth daily as needed.    . sertraline (ZOLOFT) 100 MG tablet TAKE ONE TABLET BY MOUTH EVERY DAY    . solifenacin (VESICARE) 10 MG tablet Take 10 mg by mouth daily.    . traZODone (DESYREL) 150 MG tablet TAKE ONE TABLET BY MOUTH AT BEDTIME    . ANORO ELLIPTA 62.5-25 MCG/INH AEPB Inhale 1 puff into the lungs daily.    Marland Kitchen doxycycline (VIBRAMYCIN) 100 MG capsule Take 100 mg by mouth 2 (two) times daily.    . fluticasone (FLONASE) 50 MCG/ACT nasal spray Place into both nostrils.    . folic acid (FOLVITE) 1 MG tablet Take 1 mg by mouth daily.    . hydrOXYzine (ATARAX/VISTARIL) 50 MG tablet Take 50 mg by mouth 3 (three) times daily as needed.    . silver sulfADIAZINE (SILVADENE) 1 % cream Apply 1 application topically 3 (three) times daily. Keep medication refrigerated and apply to skin cool 85 g 2   No current facility-administered medications for this visit.    Review of Systems  General: fatigue, weakness  HEENT: no complaints  Lungs: shortness of breath, cough  Cardiac: leg swelling  GI: abdominal pain, decreased appetite  GU: bladder leakage  Musculoskeletal: back pain  Extremities: no complaints  Skin: persistent moisture of skin folds  Neuro: no complaints  Endocrine: no complaints  Psych: depression     Objective:  Physical Examination:  BP (!) 105/44   Pulse 98   Temp 98.8 F (37.1 C) (Tympanic)   Resp 18   Wt 210 lb 4.8 oz (95.4 kg)   LMP  (LMP Unknown)   SpO2 99%   BMI 33.94  kg/m  Pain: 6/10  ECOG Performance Status: 3  GENERAL: alert, appears stated age.  HEENT:  Neck supple.   LUNGS:  Clear  to auscultation bilaterally.  No wheezes or rhonchi. HEART:  Regular rate and rhythm. No murmur appreciated. ABDOMEN:  Soft, nontender.  Positive, normoactive bowel sounds.  MSK:  No focal spinal tenderness to palpation. Wheelchair for ambulation. Stands and pivots only.  EXTREMITIES:  No peripheral edema.   SKIN:  Clear with no obvious rashes or skin changes. NEURO:  Nonfocal. Well oriented.  Appropriate affect. LYMPHATIC SURVEY: negative for axillary, supraclavicular and inguinal nodes. At her initial appt she had a palpable nontender 2 cm right inguinal node which is not evident today. She has moist desquamation of the groins and under the pannus  Pelvic: chaperoned by NP EGBUS: large vulvar wound involving the left and right labia L worse than right. Yellowish gray exudative tissue. Tender to palpation The remainder of the exam was negative.    Lab Review Labs on site today: pending  Radiologic Imaging: PET/CT pending will obtain 3 months after radiation completed.     Assessment:  Hannah Howard is a 80 y.o. female diagnosed withstage II SQUAMOUS CELL, WELL DIFFERENTIATED vulvar cancer s/p WPRT and vulvar radiation.  No gross malignancy remaining but very abnormal vulvar findings concerning for infection versus radiation necrosis or suboptimal wound care versus persistent disease versus moist desquamation.   Hypotension.   Vulvar pain due to malignancy and   Medical co-morbidities complicating care: COPD, Parkinson's disease, Frailty. Body mass index is 33.94 kg/m.   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 On PET no evidence of inguinal FDG+ nodal disease. I reviewed films with radiology today.There may be a slight amount of FDG avidity, but unable to measure acurately and findings are not definitive. Exam today concerning for abnormal vulvar findings concerning for infection versus radiation necrosis or suboptimal wound care versus persistent disease versus moist desquamation. She just finished radiation on 02/16/2021 so too early to intervene for persistent disease.  Discussed with Dr. Christel Mormon. We will start baking soda irrigation to the wound TID followed by silvadene to the wound TID. May be applied more liberally to provide longer coverage. Repeat exam in one week and if no improved start Flagyl.   Hypotension. We repeated BPs (sitting116/59 and standing 111/71)and there was no evidence of orthostatic hypotension. Check CBC to assess for anemia. Recommended that the NP and physician at the facility assess her medical needs.   Weakness - as above check CBC as well as LFTs and renal.   If her follow next week is reassuring then continue to follow up with Dr. Baruch Gouty as scheduled 03/23/21. We will see her back in 3-4 months for repeat PET/CT and then alternate with Dr. Baruch Gouty and Dr. Ouida Sills thereafter. Plan for PET/CT in end of 04/2021.  The patient's diagnosis, an outline of the further diagnostic and laboratory studies which will be required, the recommendation, and alternatives were discussed.  All questions were answered to the patient's satisfaction.  A total of at least 40 minutes were spent with the patient/family today; >50% was spent in education, counseling and coordination of care for  vulvar cancer.    Artice Holohan Gaetana Michaelis, MD   ADDENDUM:  Leukocytosis - repeat labs in one week. Suspect vulvar source and hopefully with better wound care  this will improve. If clinically worses then start Flagyl.  Anemia - check iron, folate, and B12 studies.  Thrombocytosis - suspect secondarily elevated as platelets are acute phase reactants and is most due to infection or inflammation Elevated creatinine - overall stable - elevated at this level 3 monts ago Borderline low calcium and sodium - continue to follow and repeat labs in one week.   Beckey Rutter, NP will share results with the facility where she resides and her medical team.  Lab Results  Component Value Date   WBC 12.3 (H) 03/14/2021   HGB 9.4 (L) 03/14/2021   HCT 29.5 (L) 03/14/2021   MCV 96.4 03/14/2021   PLT 523 (H) 03/14/2021      Chemistry      Component Value Date/Time   NA 134 (L) 03/14/2021 1501   NA 140 01/03/2012 1252   K 4.4 03/14/2021 1501   K 4.3 01/03/2012 1252   CL 98 03/14/2021 1501   CL 103 01/03/2012 1252   CO2 25 03/14/2021 1501   CO2 25 01/03/2012 1252   BUN 19 03/14/2021 1501   BUN 14 01/03/2012 1252   CREATININE 1.38 (H) 03/14/2021 1501   CREATININE 1.03 01/03/2012 1252      Component Value Date/Time   CALCIUM 8.6 (L) 03/14/2021 1501   CALCIUM 9.2 01/03/2012 1252   ALKPHOS 91 03/14/2021 1501   AST 17 03/14/2021 1501   ALT 8 03/14/2021 1501   BILITOT 0.5 03/14/2021 1501     Lab Results  Component Value Date   ALT 8 03/14/2021   AST 17 03/14/2021   ALKPHOS 91 03/14/2021   BILITOT 0.5 03/14/2021     Aoi Kouns Gaetana Michaelis, MD

## 2021-03-14 NOTE — Patient Instructions (Addendum)
Recommendations for wound care 1. Baking soda wash TID  2. Silvadene cream - may cool in fridge before application. Apply cream to wound TID after baking soda wash  Recommend evaluation by NP or staff physician for medical concerns, weakness and recent falls.   Follow up in 2 months with Gynecologic Oncology at [facility]

## 2021-03-19 NOTE — Progress Notes (Signed)
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 She says pain in her vulva is more intense since starting radiation. She has been taking tylenol without relief from her symptoms. She didn't notice much improvement with tylenol 3.   Review of systems- Review of Systems  Constitutional: Positive for malaise/fatigue. Negative for chills, fever and weight loss.  HENT: Negative for hearing loss, nosebleeds, sore throat and tinnitus.   Eyes: Negative for blurred vision and double vision.  Respiratory: Negative for cough, hemoptysis, shortness of breath and wheezing.   Cardiovascular: Negative for chest pain, palpitations and leg swelling.  Gastrointestinal: Negative for abdominal pain, blood in stool, constipation, diarrhea, melena, nausea and vomiting.  Genitourinary: Positive for frequency. Negative for dysuria and urgency.       Per hpi  Musculoskeletal: Negative for back pain, falls, joint pain and myalgias.  Skin: Negative for itching and rash.  Neurological: Positive for weakness. Negative  for dizziness, tingling, sensory change, loss of consciousness and headaches.  Endo/Heme/Allergies: Negative for environmental allergies. Does not bruise/bleed easily.  Psychiatric/Behavioral: Negative for depression. The patient is not nervous/anxious and does not have insomnia.      Allergies  Allergen Reactions  . Amoxicillin Rash  . Amoxicillin-Pot Clavulanate Rash  . Bronopol Rash    Patch test proven Allergic Contact dermatitis to Bronopol in patients soap products Patch test positive allergic contact dermatitis to Bronopol in patients soap products    Past Medical History:  Diagnosis Date  . Anxiety   . Arthritis   . Cancer ([place])    vulvar cancer  . Complication of anesthesia    woke during on of her knee operations  . COPD (chronic obstructive pulmonary disease) ([place])   . DDD (degenerative disc disease), lumbar   . Depression   . Diverticulosis   . GERD (gastroesophageal reflux disease)   . Headache    23-3x/month  . History of colonic polyps   . Hyperlipidemia   . Hypertension   . Irritable bowel syndrome   . Migraine headache   . Motion sickness    cars  . Parkinson's disease ([place])   . Parkinsonism ([place])   . Vertigo    1 episode, recently  . Vulvar cancer ([place])   . Wears dentures    partial upper    Past Surgical History:  Procedure Laterality Date  . BREAST BIOPSY Left 1973   neg  . CATARACT EXTRACTION W/PHACO Right 09/19/2020   Procedure: CATARACT EXTRACTION PHACO AND INTRAOCULAR LENS PLACEMENT ([place]) RIGHT;  Surgeon: Birder Robson, MD;  Location: [facility];  Service: Ophthalmology;  Laterality: Right;  13.20 1:12.3  . CATARACT EXTRACTION W/PHACO Left 10/10/2020   Procedure: CATARACT EXTRACTION PHACO AND INTRAOCULAR LENS PLACEMENT ([facility]) LEFT 8.86 00:51.5;  Surgeon: Birder Robson, MD;  Location: [facility];  Service: Ophthalmology;  Laterality: Left;  . CHOLECYSTECTOMY    . COLONOSCOPY WITH PROPOFOL N/A 04/07/2020    Procedure: COLONOSCOPY WITH PROPOFOL;  Surgeon: Robert Bellow, MD;  Location: [facility] END[facility]COPY;  Service: Endoscopy;  Laterality: N/A;  . KNEE ARTHROSCOPY Right 2010  . KNEE ARTHROSCOPY Left 2012  . TOTAL KNEE ARTHROPLASTY Right 10/18/2009  . TOTAL KNEE ARTHROPLASTY Left 10/08/2010  . TUBAL LIGATION      Social History   Socioeconomic History  . Marital status: Divorced    Spouse name: Not on file  . Number of children: Not on file  . Years of education: Not on file  . Highest education level: Not on file  Occupational History  . Not on file  Tobacco Use  . Smoking status: Former Smoker    Types: Cigarettes    Quit date: 07/02/2000    Years since quitting: 20.7  . Smokeless tobacco: Never Used  Vaping Use  . Vaping Use: Never used  Substance and Sexual Activity  . Alcohol use: No    Alcohol/week: 0.0 standard drinks  . Drug use: No  . Sexual activity: Not Currently  Other Topics Concern  . Not on file  Social History Narrative  . Not on file   Social Determinants of Health   Financial Resource Strain: Not on file  Food Insecurity: Not on file  Transportation Needs: Not on file  Physical Activity: Not on file  Stress: Not on file  Social Connections: Not on file  Intimate Partner Violence: Not on file    Family History  Problem Relation Age of Onset  . Heart disease Mother   . Renal cancer Father   . Breast cancer Neg Hx      Current Outpatient Medications:  .  ANORO ELLIPTA 62.5-25 MCG/INH AEPB, Inhale 1 puff into the lungs daily., Disp: , Rfl:  .  carbidopa-levodopa (SINEMET CR) 50-200 MG tablet, Take 1 tablet by mouth at bedtime., Disp: , Rfl:  .  carbidopa-levodopa (SINEMET IR) 25-250 MG tablet, Take 1 tablet by mouth 3 (three) times daily., Disp: , Rfl:  .  celecoxib (CELEBREX) 200 MG capsule, TAKE 1 CAPSULE BY MOUTH EVERY DAY, Disp: , Rfl:  .  doxepin (SINEQUAN) 25 MG capsule, Take 25 mg by mouth at bedtime., Disp: , Rfl:  .  doxycycline (VIBRAMYCIN)  100 MG capsule, Take 100 mg by mouth 2 (two) times daily., Disp: , Rfl:  .  fentaNYL (DURAGESIC) 25 MCG/HR, 1 patch every 3 (three) days., Disp: , Rfl:  .  fluticasone (FLONASE) 50 MCG/ACT nasal spray, Place into both nostrils., Disp: , Rfl:  .  folic acid (FOLVITE) 1 MG tablet, Take 1 mg by mouth daily., Disp: , Rfl:  .  gabapentin (NEURONTIN) 100 MG capsule, Take 100 mg by mouth daily., Disp: , Rfl:  .  hydrOXYzine (ATARAX/VISTARIL) 50 MG tablet, Take 50 mg by mouth 3 (three) times daily as needed., Disp: , Rfl:  .  ibuprofen (ADVIL) 400 MG tablet, Take 400 mg by mouth daily. PRN, Disp: , Rfl:  .  lansoprazole (PREVACID) 30 MG capsule, TAKE 1 CAPSULE BY MOUTH EVERY DAY, Disp: , Rfl:  .  montelukast (SINGULAIR) 10 MG tablet, TAKE ONE TABLET EVERY DAY, Disp: 30 tablet, Rfl: 3 .  oxyCODONE (OXY IR/ROXICODONE) 5 MG immediate release tablet, 1-2 tablets every 4 hours as needed, Disp: 120 tablet, Rfl: 0 .  Polyethylene Glycol 3350 (MIRALAX PO), Take 1 Dose by mouth daily as needed., Disp: , Rfl:  .  sertraline (ZOLOFT) 100 MG tablet, TAKE ONE TABLET BY MOUTH EVERY DAY, Disp: , Rfl:  .  silver sulfADIAZINE (SILVADENE) 1 % cream, Apply 1 application topically 3 (three) times daily. Keep medication refrigerated and apply to skin cool, Disp: 85 g, Rfl: 2 .  solifenacin (VESICARE) 10 MG tablet, Take 10 mg by mouth daily., Disp: , Rfl:  .  traZODone (DESYREL) 150 MG tablet, TAKE ONE TABLET BY MOUTH AT BEDTIME, Disp: , Rfl:   Physical exam:  Vitals:   01/30/21 1057  BP: (!) 108/36  Pulse: 80  Resp: 18  Temp: 99.2 F (37.3 C)  TempSrc: Tympanic  SpO2: 97%   Physical Exam Constitutional:      Appearance: She is not ill-appearing.  Genitourinary:    Comments: Vulva reddened. No evidence of ulceration. Skin intact. Known malignancy appears slightly smaller compared to previous visit.  Urinary incontinence, surrounding tissue moist.  Musculoskeletal:     Comments: Laying on her side due to  discomfort  Neurological:     Mental Status: She is alert and oriented to person, place, and time.  Psychiatric:        Mood and Affect: Mood normal.        Behavior: Behavior normal.     No images are attached to the encounter.  No results found.  Assessment and plan- Patient is a 80 y.o. female diagnosed with vulvar cancer currently undergoing radiation. Pain likely secondary to radiation and known malignancy. Encouraged use of prescription pain medication. Rotate to oxycodone. Can continue tylenol or rotate tylenol. Hygiene reviewed due to incontinence. Use peri bottle regularly, change pads frequently. Use SSD cream as prescribed by radiation to affected areas. Follow up with gyn onc as scheduled on 4/20 (approximately 4 weeks post completion of radiation). May consider holding radiation if pain worsens but patient wishes to continue if possible. If symptoms do not improve or worsen in the interim, notify clinic for re-evaluation.    Visit Diagnosis 1. Cancer associated pain     Patient expressed understanding and was in agreement with this plan. She also understands that She can call clinic at any time with any questions, concerns, or complaints.   Thank you for allowing me to participate in the care of this very pleasant patient.   Beckey Rutter, DNP, AGNP-C [facility]

## 2021-03-21 ENCOUNTER — Encounter: Payer: Self-pay | Admitting: Obstetrics and Gynecology

## 2021-03-21 ENCOUNTER — Inpatient Hospital Stay (HOSPITAL_BASED_OUTPATIENT_CLINIC_OR_DEPARTMENT_OTHER): Payer: Medicare Other | Admitting: Obstetrics and Gynecology

## 2021-03-21 VITALS — BP 125/57 | HR 97 | Temp 97.8°F | Resp 20 | Wt 202.1 lb

## 2021-03-21 DIAGNOSIS — C519 Malignant neoplasm of vulva, unspecified: Secondary | ICD-10-CM

## 2021-03-21 NOTE — Progress Notes (Signed)
Gynecologic Oncology Interval Visit   Referring Provider: Suzy Bouchardhomas J Schermerhorn, MD [address] [facility] [place] [id number] [phone]  Chief Concern: Vulvar cancer Subjective:  Hannah Howard is a 80 y.o. G2P2 female who is seen in consultation from Dr. Feliberto GottronSchermerhorn for vulvar cancer.   She completed WPRT and vulvar radiation to 66 Gy with IMRT 12/26/2020-02/16/2021. She had radiation dermatitis and cancer-related pain symptoms. She uses fentanyl patch and oxycodone for pain.   Dr. Smith Robertao recommended weekly paclitaxel/carboplatin but [name] declined chemotherapy and opted for radiation only.   She has significant vulvar pain, weakness. She has had falls recently. She resides at a nursing facility. Denies injury.   Saw Dr Sonia SideSecord last week and concern for persistent cancer versus radiation necrosis. She discussed with Dr Marinell Blighthino who recommended baking soda irrigation and silvadene cream.  She is using the silvadene but the nursing home not doing the irritation.  She cleans with wipes.  Still having a lot of pain. No fever or chills.   Gynecologic Oncology History Hannah Howard is a pleasant G2P2 female who is seen in consultation from Dr. Feliberto GottronSchermerhorn for vulvar cancer.  Please see prior notes for complete details.   She was seen by Dr. Feliberto GottronSchermerhorn on 11/27/2020 and exam revealed external genitalia: right labia minora lesion that was very edematous with necrotic edges. 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   12/06/2020 On exam she was found to have a large ulcerative and fungating 7 x 6 cm vulvar tumor involving the right labium, mid vulva, and urethra and  encroaching the vaginal margin. There appears to be another focus of vulvar tumor involving the left labium majora. Cervix: unable to perform a speculum exam due to patient discomfort. Unable to palpate the cervix.  Vagina: unable to perform a speculum exam due to patient discomfort. On palpation no lesions appreciated.  Uterus: Unable to palpate due to habitus.  Recommendation was for chemoradiation and she was seen by Dr. Smith Robertao and Dr. Rushie Chestnuthrystal  12/23/2020 PET scan IMPRESSION: 1. Intense hypermetabolic activity associated with the external genitalia consistent with primary vulvar carcinoma. 2. No evidence metastatic adenopathy in the pelvis. 3. No evidence visceral metastasis or skeletal metastasis  Other medical issues: The patient reports that she quit smoking about 22 years ago. She has a 20.00 pack-year smoking history. She has multiple medical problems including Parkinson's disease and COPD. She uses a walker at home and arrived to clinic in a wheel chair. She is very sedentary and does not think she can walk a block. She does not go up stairs.    Problem List: Patient Active Problem List   Diagnosis Date Noted  . Goals of care, counseling/discussion 12/15/2020  . Squamous cell carcinoma of vulva (HCC) 12/15/2020  . Primary osteoarthritis of right hip 04/02/2020  . Pain due to onychomycosis of toenails of both feet 12/09/2019  . Chronic generalized pain 02/05/2018  . Atypical chest pain 09/05/2017  . Dizzy spells 09/05/2017  . SOB (shortness of breath) 09/05/2017  . 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/23/2016  . Stiffness of shoulder joint, left 04/09/2016  . Obesity (BMI 30-39.9) 01/24/2016  . Atrophic vaginitis 07/03/2015  . Urinary frequency 07/03/2015  . Urge incontinence  07/03/2015  . Anxiety and depression 07/08/2014  . BP (high blood pressure) 07/08/2014  . Hyperlipidemia 07/08/2014  . Idiopathic Parkinson's disease ([place]) 07/08/2014  . Parkinson disease ([address])  07/08/2014  . HTN (hypertension) 07/08/2014  . Disc disease, degenerative, lumbar or lumbosacral 04/04/2014  . Neuritis or radiculitis due to rupture of lumbar intervertebral disc 04/04/2014  . Lumbar radiculitis 04/04/2014    Past Medical History: Past Medical History:  Diagnosis Date  . Anxiety   . Arthritis   . Cancer ([place])    vulvar cancer  . Complication of anesthesia    woke during on of her knee operations  . COPD (chronic obstructive pulmonary disease) ([place])   . DDD (degenerative disc disease), lumbar   . Depression   . Diverticulosis   . GERD (gastroesophageal reflux disease)   . Headache    23-3x/month  . History of colonic polyps   . Hyperlipidemia   . Hypertension   . Irritable bowel syndrome   . Migraine headache   . Motion sickness    cars  . Parkinson's disease ([place])   . Parkinsonism ([place])   . Vertigo    1 episode, recently  . Vulvar cancer ([place])   . Wears dentures    partial upper    Past Surgical History: Past Surgical History:  Procedure Laterality Date  . BREAST BIOPSY Left 1973   neg  . CATARACT EXTRACTION W/PHACO Right 09/19/2020   Procedure: CATARACT EXTRACTION PHACO AND INTRAOCULAR LENS PLACEMENT ([place]) RIGHT;  Surgeon: Birder Robson, MD;  Location: [facility];  Service: Ophthalmology;  Laterality: Right;  13.20 1:12.3  . CATARACT EXTRACTION W/PHACO Left 10/10/2020   Procedure: CATARACT EXTRACTION PHACO AND INTRAOCULAR LENS PLACEMENT ([facility]) LEFT 8.86 00:51.5;  Surgeon: Birder Robson, MD;  Location: [facility];  Service: Ophthalmology;  Laterality: Left;  . CHOLECYSTECTOMY    . COLONOSCOPY WITH PROPOFOL N/A 04/07/2020   Procedure: COLONOSCOPY WITH PROPOFOL;  Surgeon: Robert Bellow, MD;  Location: [facility];  Service: Endoscopy;  Laterality: N/A;  . KNEE ARTHROSCOPY Right 2010  . KNEE ARTHROSCOPY Left 2012  . TOTAL KNEE ARTHROPLASTY Right 10/18/2009  . TOTAL KNEE ARTHROPLASTY Left 10/08/2010  . TUBAL  LIGATION       Past Gynecologic History:  Menarche: 17 Last Menstrual Period: unknown; postmenopausal History of Abnormal pap: unknown Last pap: unknown   OB History  Gravida Para Term Preterm AB Living  2 2          SAB IAB Ectopic Multiple Live Births               # Outcome Date GA Lbr Len/2nd Weight Sex Delivery Anes PTL Lv  2 Para           1 Para             Family History: Family History  Problem Relation Age of Onset  . Heart disease Mother   . Renal cancer Father   . Breast cancer Neg Hx     Social History: Social History   Socioeconomic History  . Marital status: Divorced    Spouse name: Not on file  . Number of children: Not on file  . Years of education: Not on file  . Highest education level: Not on file  Occupational History  . Not on file  Tobacco Use  . Smoking status: Former Smoker    Types: Cigarettes    Quit date: 07/02/2000    Years since quitting: 20.7  . Smokeless  tobacco: Never Used  Vaping Use  . Vaping Use: Never used  Substance and Sexual Activity  . Alcohol use: No    Alcohol/week: 0.0 standard drinks  . Drug use: No  . Sexual activity: Not Currently  Other Topics Concern  . Not on file  Social History Narrative  . Not on file   Social Determinants of Health   Financial Resource Strain: Not on file  Food Insecurity: Not on file  Transportation Needs: Not on file  Physical Activity: Not on file  Stress: Not on file  Social Connections: Not on file  Intimate Partner Violence: Not on file    Allergies: Allergies  Allergen Reactions  . Amoxicillin Rash  . Amoxicillin-Pot Clavulanate Rash  . Bronopol Rash    Patch test proven Allergic Contact dermatitis to Bronopol in patients soap products Patch test positive allergic contact dermatitis to Bronopol in patients soap products    Current Medications: Current Outpatient Medications  Medication Sig Dispense Refill  . ANORO ELLIPTA 62.5-25 MCG/INH AEPB Inhale 1 puff into  the lungs daily.    . carbidopa-levodopa (SINEMET CR) 50-200 MG tablet Take 1 tablet by mouth at bedtime.    . carbidopa-levodopa (SINEMET IR) 25-250 MG tablet Take 1 tablet by mouth 3 (three) times daily.    . celecoxib (CELEBREX) 200 MG capsule TAKE 1 CAPSULE BY MOUTH EVERY DAY    . doxepin (SINEQUAN) 25 MG capsule Take 25 mg by mouth at bedtime.    Marland Kitchen doxycycline (VIBRAMYCIN) 100 MG capsule Take 100 mg by mouth 2 (two) times daily.    . fentaNYL (DURAGESIC) 25 MCG/HR 1 patch every 3 (three) days.    . fluticasone (FLONASE) 50 MCG/ACT nasal spray Place into both nostrils.    . folic acid (FOLVITE) 1 MG tablet Take 1 mg by mouth daily.    Marland Kitchen gabapentin (NEURONTIN) 100 MG capsule Take 100 mg by mouth daily.    . hydrOXYzine (ATARAX/VISTARIL) 50 MG tablet Take 50 mg by mouth 3 (three) times daily as needed.    Marland Kitchen ibuprofen (ADVIL) 400 MG tablet Take 400 mg by mouth daily. PRN    . lansoprazole (PREVACID) 30 MG capsule TAKE 1 CAPSULE BY MOUTH EVERY DAY    . montelukast (SINGULAIR) 10 MG tablet TAKE ONE TABLET EVERY DAY 30 tablet 3  . oxyCODONE (OXY IR/ROXICODONE) 5 MG immediate release tablet 1-2 tablets every 4 hours as needed 120 tablet 0  . Polyethylene Glycol 3350 (MIRALAX PO) Take 1 Dose by mouth daily as needed.    . sertraline (ZOLOFT) 100 MG tablet TAKE ONE TABLET BY MOUTH EVERY DAY    . silver sulfADIAZINE (SILVADENE) 1 % cream Apply 1 application topically 3 (three) times daily. Keep medication refrigerated and apply to skin cool 85 g 2  . solifenacin (VESICARE) 10 MG tablet Take 10 mg by mouth daily.    . traZODone (DESYREL) 150 MG tablet TAKE ONE TABLET BY MOUTH AT BEDTIME     No current facility-administered medications for this visit.    Review of Systems  General: fatigue, weakness  HEENT: no complaints  Lungs: shortness of breath, cough  Cardiac: leg swelling  GI: abdominal pain, decreased appetite  GU: bladder leakage  Musculoskeletal: back pain  Extremities: no complaints   Skin: persistent moisture of skin folds  Neuro: no complaints  Endocrine: no complaints  Psych: depression     Objective:  Physical Examination:  LMP  (LMP Unknown)  Vitals:   03/21/21 1427  BP: Marland Kitchen)  125/57  Pulse: 97  Resp: 20  Temp: 97.8 F (36.6 C)  SpO2: 100%    ECOG Performance Status: 3  GENERAL: alert, appears stated age.  HEENT:  Neck supple.   LUNGS:  Clear to auscultation bilaterally.  No wheezes or rhonchi. HEART:  Regular rate and rhythm. No murmur appreciated. ABDOMEN:  Soft, nontender.  Positive, normoactive bowel sounds.  MSK:  No focal spinal tenderness to palpation. Wheelchair for ambulation. Stands and pivots only.  EXTREMITIES:  No peripheral edema.   SKIN:  Clear with no obvious rashes or skin changes. NEURO:  Nonfocal. Well oriented.  Appropriate affect. LYMPHATIC SURVEY: negative for axillary, supraclavicular and inguinal nodes. At her initial appt she had a palpable nontender 2 cm right inguinal node which is not evident today. She has moist desquamation of the groins and under the pannus  Pelvic: chaperoned by NP EGBUS: large vulvar wound involving the left and right labia L worse than right. Yellowish gray exudative tissue. Tender to palpation. No odor.  The remainder of the exam was negative.    Lab Review CBC    Component Value Date/Time   WBC 12.3 (H) 03/14/2021 1501   RBC 3.06 (L) 03/14/2021 1501   HGB 9.4 (L) 03/14/2021 1501   HGB 13.1 01/03/2012 1252   HCT 29.5 (L) 03/14/2021 1501   HCT 39.2 01/03/2012 1252   PLT 523 (H) 03/14/2021 1501   PLT 343 01/03/2012 1252   MCV 96.4 03/14/2021 1501   MCV 94 01/03/2012 1252   MCH 30.7 03/14/2021 1501   MCHC 31.9 03/14/2021 1501   RDW 15.9 (H) 03/14/2021 1501   RDW 14.5 01/03/2012 1252   LYMPHSABS 0.4 (L) 01/16/2021 0843   MONOABS 0.6 01/16/2021 0843   EOSABS 0.6 (H) 01/16/2021 0843   BASOSABS 0.0 01/16/2021 0843     CMP Latest Ref Rng & Units 03/14/2021 01/16/2021 01/02/2021  Glucose 70 -  99 mg/dL 130(H) 116(H) 99  BUN 8 - 23 mg/dL 19 17 23   Creatinine 0.44 - 1.00 mg/dL 1.38(H) 1.22(H) 1.12(H)  Sodium 135 - 145 mmol/L 134(L) 138 135  Potassium 3.5 - 5.1 mmol/L 4.4 3.7 4.3  Chloride 98 - 111 mmol/L 98 100 100  CO2 22 - 32 mmol/L 25 25 25   Calcium 8.9 - 10.3 mg/dL 8.6(L) 9.1 8.7(L)  Total Protein 6.5 - 8.1 g/dL 6.9 6.6 6.8  Total Bilirubin 0.3 - 1.2 mg/dL 0.5 0.6 0.6  Alkaline Phos 38 - 126 U/L 91 73 87  AST 15 - 41 U/L 17 15 13(L)  ALT 0 - 44 U/L 8 <5 <5     Radiologic Imaging: PET/CT pending will obtain 3 months after radiation completed.     Assessment:  Hannah Howard is a 80 y.o. 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  On PET scan prior to radiation no evidence of inguinal FDG+ nodal disease, but there may be a slight amount of FDG avidity and findings are not definitive. Plan to repeat PET 3 months after treatment.    She has a painful large ulceration of the vulva where the cancer was initially growing.  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 Mildly elevated WBC last week, but no fever, cellulitis, odor or evidence of infection.    She is debilitated and living in nursing facility.  Using fentanyl patch and oxycodone for vulvar pain.  Medical co-morbidities complicating care: COPD, Parkinson's disease, Frailty. There is no height or weight on file to calculate BMI.   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  I asked the patient/daughter to have him call me.  He has seen the evolution of the vulva during radiation and will have a better perspective on whether this represents radiation necrosis or persistent cancer.  I suspect the former and that she may  benefit from Hyperbaric oxygen therapy.  She just finished radiation on 02/16/2021 so may be too early for biopsy to assess for persistent disease.  We can do a biopsy to rule our recurrence when Dr Baruch Gouty thinks this is appropriate.   Told the patient and daughter to ask the nursing staff to irrigate her vulva, but it does not look infected and no odor.    Plan for PET/CT in end of 04/2021.  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   The patient's diagnosis, an outline of the further diagnostic and laboratory studies which will be required, the recommendation, and alternatives were discussed.  All questions were answered to the patient's satisfaction.  A total of at least 40 minutes were spent with the patient/family today; >50% was spent in education, counseling and coordination of care for vulvar cancer.    Mellody Drown, MD

## 2021-03-23 ENCOUNTER — Ambulatory Visit
Admission: RE | Admit: 2021-03-23 | Discharge: 2021-03-23 | Disposition: A | Discharge status: Home / Self Care | Payer: Medicare Other | Source: Ambulatory Visit | Attending: Radiation Oncology | Admitting: Radiation Oncology

## 2021-03-23 ENCOUNTER — Encounter: Payer: Self-pay | Admitting: Radiation Oncology

## 2021-03-23 VITALS — BP 115/56 | HR 103 | Temp 98.1°F | Resp 20

## 2021-03-23 DIAGNOSIS — Z923 Personal history of irradiation: Secondary | ICD-10-CM | POA: Insufficient documentation

## 2021-03-23 DIAGNOSIS — C519 Malignant neoplasm of vulva, unspecified: Secondary | ICD-10-CM | POA: Diagnosis present

## 2021-03-23 DIAGNOSIS — R5381 Other malaise: Secondary | ICD-10-CM | POA: Diagnosis not present

## 2021-03-23 NOTE — Progress Notes (Signed)
Radiation Oncology Follow up Note  Name: Hannah Howard   Date:   03/23/2021 MRN:  [id number] DOB: [birth year]    This 80 y.o. 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  REFERRING PROVIDER: Idelle Crouch, MD  HPI: Patient is a 80 year old female now at 1 month having completed radiation therapy to her vulva.  She is seen today in follow-up and is having significant pain currently is on a fentanyl patch and oxycodone.Marland Kitchen  She was seen by Dr. Theora Gianotti and urged Dr. Fransisca Connors concerning for large ulceration of the vulva with a cancer was initially although the mass is completely gone.  No evidence of infection White counts are normal.  She is quite debilitated living in a nursing facility.  They are currently irrigating her vulvar region at the nursing facility where she is staying.  She has a PET/CT planned for June.  COMPLICATIONS OF TREATMENT: none  FOLLOW UP COMPLIANCE: keeps appointments   PHYSICAL EXAM:  BP (!) 115/56 (BP Location: Left Arm, Patient Position: Sitting)   Pulse (!) 103   Temp 98.1 F (36.7 C) (Tympanic)   Resp 20   LMP  (LMP Unknown)  No evidence of vulvar carcinoma is noted.  She does have some swelling of the introitus.  Exam was difficult because of patient's obesity in my office.  Well-developed well-nourished patient in NAD. HEENT reveals PERLA, EOMI, discs not visualized.  Oral cavity is clear. No oral mucosal lesions are identified. Neck is clear without evidence of cervical or supraclavicular adenopathy. Lungs are clear to A&P. Cardiac examination is essentially unremarkable with regular rate and rhythm without murmur rub or thrill. Abdomen is benign with no organomegaly or masses noted. Motor sensory and DTR levels are equal and symmetric in the upper and lower extremities. Cranial nerves II through XII are grossly intact. Proprioception is intact. No  peripheral adenopathy or edema is identified. No motor or sensory levels are noted. Crude visual fields are within normal range.  RADIOLOGY RESULTS: No current films for review  PLAN: I will talk with Dr. Fransisca Connors today but I recommend just observation at this time he continued supportive measures which is being done at the nursing home as well as her pain medication.  Patient is always been in pain since beginning treatment and is quite debilitated.  I will see her in follow-up in June when she sees Dr. Theora Gianotti and participate in examination.  At this time I would not change any other significant parameters of her care.  I would like to take this opportunity to thank you for allowing me to participate in the care of your patient.Noreene Filbert, MD

## 2021-05-16 ENCOUNTER — Other Ambulatory Visit: Payer: Self-pay

## 2021-05-16 ENCOUNTER — Inpatient Hospital Stay (HOSPITAL_BASED_OUTPATIENT_CLINIC_OR_DEPARTMENT_OTHER): Payer: Medicare Other | Admitting: Obstetrics and Gynecology

## 2021-05-16 ENCOUNTER — Inpatient Hospital Stay: Payer: Medicare Other

## 2021-05-16 ENCOUNTER — Inpatient Hospital Stay: Payer: Medicare Other | Attending: Radiation Oncology | Admitting: Hospice and Palliative Medicine

## 2021-05-16 ENCOUNTER — Ambulatory Visit
Admission: RE | Admit: 2021-05-16 | Discharge: 2021-05-16 | Disposition: A | Discharge status: Home / Self Care | Payer: Medicare Other | Source: Ambulatory Visit | Attending: Radiation Oncology | Admitting: Radiation Oncology

## 2021-05-16 ENCOUNTER — Encounter: Payer: Self-pay | Admitting: Nurse Practitioner

## 2021-05-16 VITALS — BP 105/49 | HR 91 | Temp 99.5°F | Resp 20

## 2021-05-16 DIAGNOSIS — E871 Hypo-osmolality and hyponatremia: Secondary | ICD-10-CM | POA: Insufficient documentation

## 2021-05-16 DIAGNOSIS — Z87891 Personal history of nicotine dependence: Secondary | ICD-10-CM | POA: Insufficient documentation

## 2021-05-16 DIAGNOSIS — Z515 Encounter for palliative care: Secondary | ICD-10-CM | POA: Diagnosis not present

## 2021-05-16 DIAGNOSIS — R634 Abnormal weight loss: Secondary | ICD-10-CM | POA: Insufficient documentation

## 2021-05-16 DIAGNOSIS — R600 Localized edema: Secondary | ICD-10-CM | POA: Insufficient documentation

## 2021-05-16 DIAGNOSIS — R54 Age-related physical debility: Secondary | ICD-10-CM | POA: Insufficient documentation

## 2021-05-16 DIAGNOSIS — L589 Radiodermatitis, unspecified: Secondary | ICD-10-CM | POA: Insufficient documentation

## 2021-05-16 DIAGNOSIS — Z79899 Other long term (current) drug therapy: Secondary | ICD-10-CM | POA: Insufficient documentation

## 2021-05-16 DIAGNOSIS — C519 Malignant neoplasm of vulva, unspecified: Secondary | ICD-10-CM

## 2021-05-16 DIAGNOSIS — G893 Neoplasm related pain (acute) (chronic): Secondary | ICD-10-CM

## 2021-05-16 DIAGNOSIS — I10 Essential (primary) hypertension: Secondary | ICD-10-CM | POA: Insufficient documentation

## 2021-05-16 DIAGNOSIS — G20 Parkinson's disease: Secondary | ICD-10-CM | POA: Insufficient documentation

## 2021-05-16 DIAGNOSIS — R32 Unspecified urinary incontinence: Secondary | ICD-10-CM | POA: Insufficient documentation

## 2021-05-16 DIAGNOSIS — M549 Dorsalgia, unspecified: Secondary | ICD-10-CM | POA: Insufficient documentation

## 2021-05-16 DIAGNOSIS — R944 Abnormal results of kidney function studies: Secondary | ICD-10-CM | POA: Insufficient documentation

## 2021-05-16 DIAGNOSIS — J449 Chronic obstructive pulmonary disease, unspecified: Secondary | ICD-10-CM | POA: Insufficient documentation

## 2021-05-16 DIAGNOSIS — R0602 Shortness of breath: Secondary | ICD-10-CM | POA: Insufficient documentation

## 2021-05-16 DIAGNOSIS — Z923 Personal history of irradiation: Secondary | ICD-10-CM | POA: Insufficient documentation

## 2021-05-16 DIAGNOSIS — D649 Anemia, unspecified: Secondary | ICD-10-CM | POA: Insufficient documentation

## 2021-05-16 DIAGNOSIS — Z01818 Encounter for other preprocedural examination: Secondary | ICD-10-CM

## 2021-05-16 LAB — CBC WITH DIFFERENTIAL/PLATELET
Abs Immature Granulocytes: 0.13 10*3/uL — ABNORMAL HIGH (ref 0.00–0.07)
Basophils Absolute: 0 10*3/uL (ref 0.0–0.1)
Basophils Relative: 0 %
Eosinophils Absolute: 0.8 10*3/uL — ABNORMAL HIGH (ref 0.0–0.5)
Eosinophils Relative: 4 %
HCT: 28.1 % — ABNORMAL LOW (ref 36.0–46.0)
Hemoglobin: 8.8 g/dL — ABNORMAL LOW (ref 12.0–15.0)
Immature Granulocytes: 1 %
Lymphocytes Relative: 3 %
Lymphs Abs: 0.6 10*3/uL — ABNORMAL LOW (ref 0.7–4.0)
MCH: 28.8 pg (ref 26.0–34.0)
MCHC: 31.3 g/dL (ref 30.0–36.0)
MCV: 91.8 fL (ref 80.0–100.0)
Monocytes Absolute: 0.7 10*3/uL (ref 0.1–1.0)
Monocytes Relative: 4 %
Neutro Abs: 15.3 10*3/uL — ABNORMAL HIGH (ref 1.7–7.7)
Neutrophils Relative %: 88 %
Platelets: 742 10*3/uL — ABNORMAL HIGH (ref 150–400)
RBC: 3.06 MIL/uL — ABNORMAL LOW (ref 3.87–5.11)
RDW: 14.3 % (ref 11.5–15.5)
WBC: 17.5 10*3/uL — ABNORMAL HIGH (ref 4.0–10.5)
nRBC: 0 % (ref 0.0–0.2)

## 2021-05-16 LAB — COMPREHENSIVE METABOLIC PANEL
ALT: <5 U/L (ref 0–44)
AST: 13 U/L — ABNORMAL LOW (ref 15–41)
Albumin: 3 g/dL — ABNORMAL LOW (ref 3.5–5.0)
Alkaline Phosphatase: 102 U/L (ref 38–126)
Anion gap: 8 (ref 5–15)
BUN: 15 mg/dL (ref 8–23)
CO2: 26 mmol/L (ref 22–32)
Calcium: 9 mg/dL (ref 8.9–10.3)
Chloride: 97 mmol/L — ABNORMAL LOW (ref 98–111)
Creatinine, Ser: 1 mg/dL (ref 0.44–1.00)
GFR, Estimated: 57 mL/min — ABNORMAL LOW (ref >60)
Glucose, Bld: 125 mg/dL — ABNORMAL HIGH (ref 70–99)
Potassium: 4.3 mmol/L (ref 3.5–5.1)
Sodium: 131 mmol/L — ABNORMAL LOW (ref 135–145)
Total Bilirubin: 0.6 mg/dL (ref 0.3–1.2)
Total Protein: 7.1 g/dL (ref 6.5–8.1)

## 2021-05-16 MED ORDER — FENTANYL 37.5 MCG/HR TD PT72: 1.0000 | MEDICATED_PATCH | TRANSDERMAL | 0 refills | Status: DC

## 2021-05-16 NOTE — Progress Notes (Signed)
Gynecologic Oncology Interval Visit   Referring Provider: Boykin Nearing, MD [address] [facility] [place],  [place] [address] [phone]  Chief Concern: Vulvar cancer Subjective:  Hannah Howard is a 79 y.o. G12P2 female who is seen in consultation from Dr. Ouida Sills for vulvar cancer.   She presents today for surveillance. She is supposed to have a PET/CT end of 04/2021.   She was seen in clinic on 03/14/2021 and noted to have a very concerning vulvar wound and concern for persistent cancer versus radiation necrosis. Dr Christel Mormon recommended baking soda irrigation and silvadene cream.  She was using the silvadene but the nursing home not doing the irrigation. She then saw Dr. Fransisca Connors on 03/21/2021 and he recommended she be seen by Dr. Baruch Gouty and possible hyperbaric. She has not had hyperbaric therapy.   Still having a lot of vulvar pain. Increased urinary incontinence. Increased weakness and debility. Continues to reside at nursing facility. Daughter and patient note weight loss. Increased use of pain medication without significant relief. No fevers or chills.    Gynecologic Oncology History Hannah Howard is a pleasant G43P2 female who is seen in consultation from Dr. Ouida Sills for vulvar cancer.  Please see prior notes for complete details.   She was seen by Dr. Ouida Sills on 11/27/2020 and exam revealed external genitalia: right labia minora lesion that was very edematous with necrotic edges. 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   12/06/2020 On exam she was found to have a large ulcerative and fungating 7 x 6 cm vulvar tumor involving the right labium,  mid vulva, and urethra and encroaching the vaginal margin. There appears to be another focus of vulvar tumor involving the left labium majora. Cervix: unable to perform a speculum exam due to patient discomfort. Unable to palpate the cervix.  Vagina: unable to perform a speculum exam due to patient discomfort. On palpation no lesions appreciated.  Uterus: Unable to palpate due to habitus.  Recommendation was for chemoradiation and she was seen by Dr. Janese Banks and Dr. Baruch Gouty  12/23/2020 PET scan IMPRESSION: 1. Intense hypermetabolic activity associated with the external genitalia consistent with primary vulvar carcinoma. 2. No evidence metastatic adenopathy in the pelvis. 3. No evidence visceral metastasis or skeletal metastasis  Other medical issues: The patient reports that she quit smoking about 22 years ago. She has a 20.00 pack-year smoking history. She has multiple medical problems including Parkinson's disease and COPD. She uses a walker at home and arrived to clinic in a wheel chair. She is very sedentary and does not think she can walk a block. She does not go up stairs.    IMRT 12/26/2020-02/16/2021. She completed WPRT and vulvar radiation to 66 Gy. She had radiation dermatitis and cancer-related pain symptoms. Dr. Janese Banks recommended weekly paclitaxel/carboplatin but Hannah Howard declined chemotherapy and opted for radiation only.   Problem List: Patient Active Problem List   Diagnosis Date Noted   Goals of care, counseling/discussion 12/15/2020   Squamous cell carcinoma of vulva ([place]) 12/15/2020   Primary osteoarthritis of right hip 04/02/2020   Pain due to onychomycosis of toenails of both feet 12/09/2019   Chronic generalized pain 02/05/2018   Atypical chest pain 09/05/2017   Dizzy spells 09/05/2017  SOB (shortness of breath) 09/05/2017   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/23/2016   Stiffness of shoulder joint, left 04/09/2016   Obesity (BMI 30-39.9) 01/24/2016   Atrophic vaginitis  07/03/2015   Urinary frequency 07/03/2015   Urge incontinence 07/03/2015   Anxiety and depression 07/08/2014   BP (high blood pressure) 07/08/2014   Hyperlipidemia 07/08/2014   Idiopathic Parkinson's disease ([place]) 07/08/2014   Parkinson disease ([place]) 07/08/2014   HTN (hypertension) 07/08/2014   Disc disease, degenerative, lumbar or lumbosacral 04/04/2014   Neuritis or radiculitis due to rupture of lumbar intervertebral disc 04/04/2014   Lumbar radiculitis 04/04/2014    Past Medical History: Past Medical History:  Diagnosis Date   Anxiety    Arthritis    Cancer ([place])    vulvar cancer   Complication of anesthesia    woke during on of her knee operations   COPD (chronic obstructive pulmonary disease) ([facility])    DDD (degenerative disc disease), lumbar    Depression    Diverticulosis    GERD (gastroesophageal reflux disease)    Headache    23-3x/month   History of colonic polyps    Hyperlipidemia    Hypertension    Irritable bowel syndrome    Migraine headache    Motion sickness    cars   Parkinson's disease ([place])    Parkinsonism ([facility])    Vertigo    1 episode, recently   Vulvar cancer ([place])    Wears dentures    partial upper    Past Surgical History: Past Surgical History:  Procedure Laterality Date   BREAST BIOPSY Left 1973   neg   CATARACT EXTRACTION W/PHACO Right 09/19/2020   Procedure: CATARACT EXTRACTION PHACO AND INTRAOCULAR LENS PLACEMENT ([place]) RIGHT;  Surgeon: Birder Robson, MD;  Location: [facility];  Service: Ophthalmology;  Laterality: Right;  13.20 1:12.3   CATARACT EXTRACTION W/PHACO Left 10/10/2020   Procedure: CATARACT EXTRACTION PHACO AND INTRAOCULAR LENS PLACEMENT ([facility]) LEFT 8.86 00:51.5;  Surgeon: Birder Robson, MD;  Location: [facility];  Service: Ophthalmology;  Laterality: Left;   CHOLECYSTECTOMY     COLONOSCOPY WITH PROPOFOL N/A 04/07/2020   Procedure: COLONOSCOPY WITH PROPOFOL;  Surgeon: Robert Bellow, MD;   Location: [facility];  Service: Endoscopy;  Laterality: N/A;   KNEE ARTHROSCOPY Right 2010   KNEE ARTHROSCOPY Left 2012   TOTAL KNEE ARTHROPLASTY Right 10/18/2009   TOTAL KNEE ARTHROPLASTY Left 10/08/2010   TUBAL LIGATION       Past Gynecologic History:  Menarche: 17 Last Menstrual Period: unknown; postmenopausal History of Abnormal pap: unknown Last pap: unknown   OB History  Gravida Para Term Preterm AB Living  2 2          SAB IAB Ectopic Multiple Live Births               # Outcome Date GA Lbr Len/2nd Weight Sex Delivery Anes PTL Lv  2 Para           1 Para             Family History: Family History  Problem Relation Age of Onset   Heart disease Mother    Renal cancer Father    Breast cancer Neg Hx     Social History: Social History   Socioeconomic History   Marital status: Divorced    Spouse name: Not on file   Number of children: Not on file   Years of education: Not on file   Highest education level: Not  on file  Occupational History   Not on file  Tobacco Use   Smoking status: Former    Pack years: 0.00    Types: Cigarettes    Quit date: 07/02/2000    Years since quitting: 20.8   Smokeless tobacco: Never  Vaping Use   Vaping Use: Never used  Substance and Sexual Activity   Alcohol use: No    Alcohol/week: 0.0 standard drinks   Drug use: No   Sexual activity: Not Currently  Other Topics Concern   Not on file  Social History Narrative   Not on file   Social Determinants of Health   Financial Resource Strain: Not on file  Food Insecurity: Not on file  Transportation Needs: Not on file  Physical Activity: Not on file  Stress: Not on file  Social Connections: Not on file  Intimate Partner Violence: Not on file    Allergies: Allergies  Allergen Reactions   Amoxicillin Rash   Amoxicillin-Pot Clavulanate Rash   Bronopol Rash    Patch test proven Allergic Contact dermatitis to Bronopol in patients soap products Patch test positive  allergic contact dermatitis to Bronopol in patients soap products    Current Medications: Current Outpatient Medications  Medication Sig Dispense Refill   ANORO ELLIPTA 62.5-25 MCG/INH AEPB Inhale 1 puff into the lungs daily.     carbidopa-levodopa (SINEMET CR) 50-200 MG tablet Take 1 tablet by mouth at bedtime.     carbidopa-levodopa (SINEMET IR) 25-250 MG tablet Take 1 tablet by mouth 3 (three) times daily.     celecoxib (CELEBREX) 200 MG capsule TAKE 1 CAPSULE BY MOUTH EVERY DAY     doxepin (SINEQUAN) 25 MG capsule Take 25 mg by mouth at bedtime.     doxycycline (VIBRAMYCIN) 100 MG capsule Take 100 mg by mouth 2 (two) times daily.     fentaNYL (DURAGESIC) 25 MCG/HR 1 patch every 3 (three) days.     fluticasone (FLONASE) 50 MCG/ACT nasal spray Place into both nostrils.     folic acid (FOLVITE) 1 MG tablet Take 1 mg by mouth daily.     gabapentin (NEURONTIN) 100 MG capsule Take 100 mg by mouth daily.     hydrOXYzine (ATARAX/VISTARIL) 50 MG tablet Take 50 mg by mouth 3 (three) times daily as needed.     ibuprofen (ADVIL) 400 MG tablet Take 400 mg by mouth daily. PRN     lansoprazole (PREVACID) 30 MG capsule TAKE 1 CAPSULE BY MOUTH EVERY DAY     montelukast (SINGULAIR) 10 MG tablet TAKE ONE TABLET EVERY DAY 30 tablet 3   oxyCODONE (OXY IR/ROXICODONE) 5 MG immediate release tablet 1-2 tablets every 4 hours as needed 120 tablet 0   Polyethylene Glycol 3350 (MIRALAX PO) Take 1 Dose by mouth daily as needed.     sertraline (ZOLOFT) 100 MG tablet TAKE ONE TABLET BY MOUTH EVERY DAY     silver sulfADIAZINE (SILVADENE) 1 % cream Apply 1 application topically 3 (three) times daily. Keep medication refrigerated and apply to skin cool 85 g 2   solifenacin (VESICARE) 10 MG tablet Take 10 mg by mouth daily.     traZODone (DESYREL) 150 MG tablet TAKE ONE TABLET BY MOUTH AT BEDTIME     No current facility-administered medications for this visit.   Review of Systems General:  shortness of breath,  fatigue, weakness Skin: vulvar pain, persistent moisture of skin folds Eyes: no complaints HEENT: no complaints Breasts: no complaints Pulmonary: cough Cardiac: leg swelling Gastrointestinal: abdominal and  pelvic pain, decreased appetite Genitourinary/Sexual: bladder leakage,  Ob/Gyn: Vulvar pain, and discharge Musculoskeletal: chronic back pain Hematology: no complaints Neurologic/Psych: depression  Objective:  Physical Examination:  BP (!) 105/49   Pulse 91   Temp 99.5 F (37.5 C)   Resp 20   LMP  (LMP Unknown)   SpO2 100%  Vitals:   05/16/21 1058  BP: (!) 105/49  Pulse: 91  Resp: 20  Temp: 99.5 F (37.5 C)  SpO2: 100%   ECOG Performance Status: 3  GENERAL: elderly female, uncomfortable appearing LUNGS:  Clear to auscultation bilaterally.  No wheezes or rhonchi. HEART:  Regular rate and rhythm. No murmur appreciated. ABDOMEN:  Soft, nontender.  MSK:  wheelchair for ambulation. Weak. Requires 2+ persons to stand and pivot EXTREMITIES: bilateral lower extremity edema 2+ SKIN:  persistent moisture and redness of skin folds NEURO:  Nonfocal. Well oriented.  Appropriate affect.  Pelvic: Exam chaperoned by NP  Pelvic: chaperoned by NP EGBUS: large vulvar wound involving the left and right labia L worse than right. Yellowish gray exudative tissue. Tender to palpation. No odor.  The remainder of the exam was deferred to the OR   05/16/21    Lab Review CBC    Component Value Date/Time   WBC 12.3 (H) 03/14/2021 1501   RBC 3.06 (L) 03/14/2021 1501   HGB 9.4 (L) 03/14/2021 1501   HGB 13.1 01/03/2012 1252   HCT 29.5 (L) 03/14/2021 1501   HCT 39.2 01/03/2012 1252   PLT 523 (H) 03/14/2021 1501   PLT 343 01/03/2012 1252   MCV 96.4 03/14/2021 1501   MCV 94 01/03/2012 1252   MCH 30.7 03/14/2021 1501   MCHC 31.9 03/14/2021 1501   RDW 15.9 (H) 03/14/2021 1501   RDW 14.5 01/03/2012 1252   LYMPHSABS 0.4 (L) 01/16/2021 0843   MONOABS 0.6 01/16/2021 0843   EOSABS 0.6  (H) 01/16/2021 0843   BASOSABS 0.0 01/16/2021 0843     CMP Latest Ref Rng & Units 03/14/2021 01/16/2021 01/02/2021  Glucose 70 - 99 mg/dL 130(H) 116(H) 99  BUN 8 - 23 mg/dL 19 17 23   Creatinine 0.44 - 1.00 mg/dL 1.38(H) 1.22(H) 1.12(H)  Sodium 135 - 145 mmol/L 134(L) 138 135  Potassium 3.5 - 5.1 mmol/L 4.4 3.7 4.3  Chloride 98 - 111 mmol/L 98 100 100  CO2 22 - 32 mmol/L 25 25 25   Calcium 8.9 - 10.3 mg/dL 8.6(L) 9.1 8.7(L)  Total Protein 6.5 - 8.1 g/dL 6.9 6.6 6.8  Total Bilirubin 0.3 - 1.2 mg/dL 0.5 0.6 0.6  Alkaline Phos 38 - 126 U/L 91 73 87  AST 15 - 41 U/L 17 15 13(L)  ALT 0 - 44 U/L 8 <5 <5     Radiologic Imaging: PET/CT plan to order for end of June 2022    Assessment:  Hannah Howard is a 80 y.o. 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  On PET scan prior to radiation no evidence of inguinal FDG+ nodal disease, but there may be a slight amount of FDG avidity and findings are not definitive. Plan to repeat PET 3 months after treatment.    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 It has now been almost 3 months post radiation.    She is debilitated and living in nursing facility.    Vulvar pain, suboptimally controlled fentanyl patch and oxycodone.   Anemia  Elevated creatinine  Hyponatremia  Medical co-morbidities complicating care:  COPD, Parkinson's disease, Frailty.     Plan:   Problem List Items Addressed This Visit       Genitourinary   Squamous cell carcinoma of vulva ([place]) - Primary   Other Visit Diagnoses     Radiation dermatitis           Plan for PET/CT first week of July. Repeat labs to assess anemia, renal function, and electrolyte abnormalities. Recommend she follow up with her PCP. Obtain ECG.  Dr. Ubaldo Glassing is her cardiologist and we will reach out to him for cardiac clearance. She saw Dr. Ubaldo Glassing for chest pain. She has  a h/o COPD that was managed by her PCP, Dr. Doy Hutching.   Plan for OR at [facility] with EUA, irrigation, and vulvar biopsies. She will be seen at [facility] for her preop visit and [facility] with anesthesia. She will be admitted after surgery for pain control, palliative care consult, and wound care consult. If pathology is negative for persistent cancer she may be a candidate for hyperbaric therapy.   The patient's diagnosis, an outline of the further diagnostic and laboratory studies which will be required, the recommendation, and alternatives were discussed.  All questions were answered to the patient's satisfaction.  A total of at least 40 minutes were spent with the patient/family today; >50% was spent in education, counseling and coordination of care for vulvar cancer.    Beckey Rutter, DNP, AGNP-C [place] at [facility] [phone] (clinic)  I personally had a face to face interaction and evaluated the patient jointly with the NP, Ms. Beckey Rutter.  I have reviewed her history and available records and have performed the key portions of the physical exam including abdominal exam, pelvic exam with my findings confirming those documented above by the APP.  I have discussed the case with the APP and the patient.  I agree with the above documentation, assessment and plan which was fully formulated by me.  Counseling was completed by me.   I personally saw the patient and performed a substantive portion of this encounter in conjunction with the listed APP as documented above.  Reginald Mangels Gaetana Michaelis, MD

## 2021-05-16 NOTE — Progress Notes (Signed)
[place]  [phone] [phone]   Name: Hannah Howard Date: 02/21/5187 MRN: [id number]  DOB: 1940-12-14  Patient Care Team: Idelle Crouch, MD as PCP - General (Internal Medicine) Noreene Filbert, MD as Referring Physician (Radiation Oncology) Gillis Ends, MD as Referring Physician (Obstetrics) Sindy Guadeloupe, MD as Consulting Physician (Oncology)    REASON FOR CONSULTATION: Hannah Howard is a 80 y.o. 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 PET scan showed no evidence of distant metastatic disease. Plan was for chemoradiation but patient decided to forego chemotherapy and just pursue XRT.  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  She has had severe pain and was referred to palliative care to help address goals and manage ongoing symptoms.  SOCIAL HISTORY:     reports that she quit smoking about 20 years ago. Her smoking use included cigarettes. She has never used smokeless tobacco. She reports that she does not drink alcohol and does not use drugs.  Patient is widowed. She lives at home alone but recently moved to [facility]. Patient has a son and daughter who live nearby. Patient's granddaughter is also involved in her care. 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  ADVANCE DIRECTIVES:  Not on file  CODE STATUS: Full code  PAST MEDICAL HISTORY: Past Medical History:  Diagnosis Date   Anxiety    Arthritis    Cancer ([place])    vulvar cancer   Complication of anesthesia    woke during on of her knee operations   COPD (chronic obstructive pulmonary disease) (HCC)    DDD (degenerative disc disease), lumbar    Depression    Diverticulosis    GERD (gastroesophageal reflux disease)    Headache    23-3x/month    History of colonic polyps    Hyperlipidemia    Hypertension    Irritable bowel syndrome    Migraine headache    Motion sickness    cars   Parkinson's disease ([place])    Parkinsonism ([place])    Vertigo    1 episode, recently   Vulvar cancer ([place])    Wears dentures    partial upper    PAST SURGICAL HISTORY:  Past Surgical History:  Procedure Laterality Date   BREAST BIOPSY Left 1973   neg   CATARACT EXTRACTION W/PHACO Right 09/19/2020   Procedure: CATARACT EXTRACTION PHACO AND INTRAOCULAR LENS PLACEMENT ([place]) RIGHT;  Surgeon: Birder Robson, MD;  Location: [facility];  Service: Ophthalmology;  Laterality: Right;  13.20 1:12.3   CATARACT EXTRACTION W/PHACO Left 10/10/2020   Procedure: CATARACT EXTRACTION PHACO AND INTRAOCULAR LENS PLACEMENT ([facility]) LEFT 8.86 00:51.5;  Surgeon: Birder Robson, MD;  Location: [facility];  Service: Ophthalmology;  Laterality: Left;   CHOLECYSTECTOMY     COLONOSCOPY WITH PROPOFOL N/A 04/07/2020   Procedure: COLONOSCOPY WITH PROPOFOL;  Surgeon: Robert Bellow, MD;  Location: [facility];  Service: Endoscopy;  Laterality: N/A;   KNEE ARTHROSCOPY Right 2010   KNEE ARTHROSCOPY Left 2012   TOTAL KNEE ARTHROPLASTY Right 10/18/2009   TOTAL KNEE ARTHROPLASTY Left 10/08/2010   TUBAL LIGATION      HEMATOLOGY/ONCOLOGY HISTORY:  Oncology History  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  MEDICATIONS:  Current Outpatient Medications  Medication Sig Dispense Refill   fentaNYL 37.5 MCG/HR  PT72 Place 1 patch onto the skin every 3 (three) days. 10 patch 0   ANORO ELLIPTA 62.5-25 MCG/INH AEPB Inhale 1 puff into the lungs daily.     carbidopa-levodopa (SINEMET CR) 50-200 MG tablet Take 1 tablet by mouth at bedtime.     carbidopa-levodopa (SINEMET IR) 25-250 MG tablet Take 1 tablet by mouth 3 (three) times daily.     celecoxib (CELEBREX) 200 MG capsule TAKE 1 CAPSULE BY MOUTH EVERY DAY     doxepin (SINEQUAN) 25 MG capsule Take 25 mg by mouth at bedtime.     doxycycline (VIBRAMYCIN) 100 MG capsule Take 100 mg by mouth 2 (two) times daily.     fluticasone (FLONASE) 50 MCG/ACT nasal spray Place into both nostrils.     folic acid (FOLVITE) 1 MG tablet Take 1 mg by mouth daily.     gabapentin (NEURONTIN) 100 MG capsule Take 100 mg by mouth daily.     hydrOXYzine (ATARAX/VISTARIL) 50 MG tablet Take 50 mg by mouth 3 (three) times daily as needed.     ibuprofen (ADVIL) 400 MG tablet Take 400 mg by mouth daily. PRN     lansoprazole (PREVACID) 30 MG capsule TAKE 1 CAPSULE BY MOUTH EVERY DAY     montelukast (SINGULAIR) 10 MG tablet TAKE ONE TABLET EVERY DAY 30 tablet 3   oxyCODONE (OXY IR/ROXICODONE) 5 MG immediate release tablet 1-2 tablets every 4 hours as needed 120 tablet 0   Polyethylene Glycol 3350 (MIRALAX PO) Take 1 Dose by mouth daily as needed.     sertraline (ZOLOFT) 100 MG tablet TAKE ONE TABLET BY MOUTH EVERY DAY     silver sulfADIAZINE (SILVADENE) 1 % cream Apply 1 application topically 3 (three) times daily. Keep medication refrigerated and apply to skin cool 85 g 2   solifenacin (VESICARE) 10 MG tablet Take 10 mg by mouth daily.     traZODone (DESYREL) 150 MG tablet TAKE ONE TABLET BY MOUTH AT BEDTIME     No current facility-administered medications for this visit.    VITAL SIGNS: LMP  (LMP Unknown)  There were no vitals filed for this visit.  Estimated body mass index is 32.62 kg/m as calculated from the following:   Height as of 09/19/20: 5\' 6"  (1.676 m).   Weight as  of 03/21/21: 202 lb 1.6 oz (91.7 kg).  LABS: CBC:    Component Value Date/Time   WBC 12.3 (H) 03/14/2021 1501   HGB 9.4 (L) 03/14/2021 1501   HGB 13.1 01/03/2012 1252   HCT 29.5 (L) 03/14/2021 1501   HCT 39.2 01/03/2012 1252   PLT 523 (H) 03/14/2021 1501   PLT 343 01/03/2012 1252   MCV 96.4 03/14/2021 1501   MCV 94 01/03/2012 1252   NEUTROABS 7.9 (H) 01/16/2021 0843   LYMPHSABS 0.4 (L) 01/16/2021 0843   MONOABS 0.6 01/16/2021 0843   EOSABS 0.6 (H) 01/16/2021 0843   BASOSABS 0.0 01/16/2021 0843   Comprehensive Metabolic Panel:    Component Value Date/Time   NA 134 (L) 03/14/2021 1501  NA 140 01/03/2012 1252   K 4.4 03/14/2021 1501   K 4.3 01/03/2012 1252   CL 98 03/14/2021 1501   CL 103 01/03/2012 1252   CO2 25 03/14/2021 1501   CO2 25 01/03/2012 1252   BUN 19 03/14/2021 1501   BUN 14 01/03/2012 1252   CREATININE 1.38 (H) 03/14/2021 1501   CREATININE 1.03 01/03/2012 1252   GLUCOSE 130 (H) 03/14/2021 1501   GLUCOSE 94 01/03/2012 1252   CALCIUM 8.6 (L) 03/14/2021 1501   CALCIUM 9.2 01/03/2012 1252   AST 17 03/14/2021 1501   ALT 8 03/14/2021 1501   ALKPHOS 91 03/14/2021 1501   BILITOT 0.5 03/14/2021 1501   PROT 6.9 03/14/2021 1501   ALBUMIN 3.2 (L) 03/14/2021 1501    RADIOGRAPHIC STUDIES: No results found.   PERFORMANCE STATUS (ECOG) : 2-3  Review of Systems Unless otherwise noted, a complete review of systems is negative.  Physical Exam General: NAD, frail appearing Pulmonary: Unlabored Extremities: no edema, no joint deformities Skin: no rashes Neurological: Weakness but otherwise nonfocal  IMPRESSION: Patient was an acute add-on to my clinic schedule today for evaluation and management of severe and persistent vulvar pain.  In April, she was noted to have a concerning vulvar wound that was thought secondary to persistent cancer versus radiation necrosis.  Patient was started on baking soda irrigation and Silvadene cream but the SNF was not consistently  irrigating the wound.  Plan is for debulking surgery in July.   Since I last saw her, patient is now a resident at [facility].   Patient describes severe and persistent vulvar pain not relieved with current medication regimen of transdermal fentanyl 25 mcg to 72 hours and oxycodone 10 mg twice daily.  MAR from facility reviewed and case discussed extensively with patient and daughter.  It appears that patient has oxycodone 10 mg every 4 hours as needed but is not consistently receiving medications.  Daughter reports that facility staff have some concerns that pain medications could increase patient's fall risk.  Patient and daughter state clearly that their goal is comfort.   Discussed options with patient and daughter.  Given concern the patient is not able to adequately access as needed medications at the facility, will plan to dose increase her transdermal fentanyl 37.5 mcg every 72 hours.  Continue oxycodone as needed for breakthrough pain.  Patient also endorses constipation.  We will plan to increase her senna to twice daily.  I highly recommend palliative care consult at SNF and daughter plans to advocate for this.  PLAN: -Continue current scope of treatment -Increase fentanyl 37.5 mcg every 72 hours #10, prescription given to daughter and take back to facility -Liberalize administration of oxycodone for pain -Daily MiraLAX/senna -Palliative care to follow at SNF  Case and plan discussed with Beckey Rutter, NP   Patient expressed understanding and was in agreement with this plan. She also understands that She can call the clinic at any time with any questions, concerns, or complaints.     Time Total: 30 minutes  Visit consisted of counseling and education dealing with the complex and emotionally intense issues of symptom management and palliative care in the setting of serious and potentially life-threatening illness.Greater than 50%  of this time was spent counseling  and coordinating care related to the above assessment and plan.  Signed by: Altha Harm, PhD, NP-C

## 2021-05-16 NOTE — Progress Notes (Signed)
Radiation Oncology Follow up Note  Name: Hannah Howard   Date:   05/16/2021 MRN:  [id number] DOB: 23-Aug-1941    This 80 y.o. 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  REFERRING PROVIDER: Idelle Crouch, MD  HPI: Patient is a 80 year old female now out 3 months having completed radiation therapy to her vulva for locally advanced disease.  She has been having extreme pain and urinary incontinence.  She was seen today and examined by Dr. Theora Gianotti with me present.  COMPLICATIONS OF TREATMENT: present  FOLLOW UP COMPLIANCE: keeps appointments   PHYSICAL EXAM:  LMP  (LMP Unknown)  On speculum examination there is marked necrosis of the vaginal vault.  No evidence of overt residual carcinoma is noted.  Does exhibit urinary incontinence.  She has significant lower extremity edema.  Well-developed well-nourished patient in NAD. HEENT reveals PERLA, EOMI, discs not visualized.  Oral cavity is clear. No oral mucosal lesions are identified. Neck is clear without evidence of cervical or supraclavicular adenopathy. Lungs are clear to A&P. Cardiac examination is essentially unremarkable with regular rate and rhythm without murmur rub or thrill. Abdomen is benign with no organomegaly or masses noted. Motor sensory and DTR levels are equal and symmetric in the upper and lower extremities. Cranial nerves II through XII are grossly intact. Proprioception is intact. No peripheral adenopathy or edema is identified. No motor or sensory levels are noted. Crude visual fields are within normal range.  RADIOLOGY RESULTS: Present time patient will be taken to surgery by Dr. Theora Gianotti for debridement of her marked necrosis.  Whether this is necrosis from her locally advanced disease or radiation necrosis is yet undetermined.  Biopsies will be obtained.  She may benefit from hyperbaric oxygen.  Patient is also scheduled  for a PET CT scan the future which I will review when it becomes available.  Patient is aware of her treatment plan and arrangements for surgery at [facility] will be made.  I would like to take this opportunity to thank you for allowing me to participate in the care of your patient.Marland Kitchen PLAN: As above    Noreene Filbert, MD

## 2021-05-18 ENCOUNTER — Other Ambulatory Visit: Payer: Self-pay

## 2021-05-18 ENCOUNTER — Ambulatory Visit
Admission: RE | Admit: 2021-05-18 | Discharge: 2021-05-18 | Disposition: A | Discharge status: Home / Self Care | Payer: Medicare Other | Source: Ambulatory Visit | Attending: Family Medicine | Admitting: Family Medicine

## 2021-05-18 DIAGNOSIS — Z01810 Encounter for preprocedural cardiovascular examination: Secondary | ICD-10-CM

## 2021-05-18 DIAGNOSIS — C519 Malignant neoplasm of vulva, unspecified: Secondary | ICD-10-CM | POA: Insufficient documentation

## 2021-05-25 ENCOUNTER — Encounter: Payer: Self-pay | Admitting: Oncology

## 2021-05-29 ENCOUNTER — Ambulatory Visit
Admission: RE | Admit: 2021-05-29 | Discharge: 2021-05-29 | Disposition: A | Discharge status: Home / Self Care | Payer: Medicare (Managed Care) | Source: Ambulatory Visit | Attending: Nurse Practitioner | Admitting: Nurse Practitioner

## 2021-05-29 ENCOUNTER — Other Ambulatory Visit: Payer: Self-pay

## 2021-05-29 DIAGNOSIS — I2510 Atherosclerotic heart disease of native coronary artery without angina pectoris: Secondary | ICD-10-CM | POA: Insufficient documentation

## 2021-05-29 DIAGNOSIS — C7800 Secondary malignant neoplasm of unspecified lung: Secondary | ICD-10-CM | POA: Insufficient documentation

## 2021-05-29 DIAGNOSIS — C519 Malignant neoplasm of vulva, unspecified: Secondary | ICD-10-CM | POA: Diagnosis present

## 2021-05-29 DIAGNOSIS — I700 Atherosclerosis of aorta: Secondary | ICD-10-CM | POA: Diagnosis not present

## 2021-05-29 LAB — GLUCOSE, CAPILLARY: Glucose-Capillary: 91 mg/dL (ref 70–99)

## 2021-05-29 MED ORDER — FLUDEOXYGLUCOSE F - 18 (FDG) INJECTION
10.8300 | Freq: Once | INTRAVENOUS | Status: AC | PRN
  Administered 2021-05-29: 10.83 via INTRAVENOUS

## 2021-05-30 ENCOUNTER — Other Ambulatory Visit: Payer: Self-pay | Admitting: Nurse Practitioner

## 2021-05-30 DIAGNOSIS — C519 Malignant neoplasm of vulva, unspecified: Secondary | ICD-10-CM

## 2021-05-30 NOTE — Progress Notes (Signed)
Albumin is 3.0. Dr. Theora Gianotti recommends evaluation with dietician in anticipation of surgery.  PET scan results pending.  WBC elevated on previous cbc. Recommend redrawing today to evaluate for possible infection.  Spoke to patient's daughter who will coordinate transportation to cancer center and if unable, will get labs drawn at facility where she resides.

## 2021-05-31 ENCOUNTER — Encounter: Payer: Self-pay | Admitting: Nurse Practitioner

## 2021-06-01 ENCOUNTER — Telehealth: Payer: Self-pay | Admitting: Nurse Practitioner

## 2021-06-01 DIAGNOSIS — C519 Malignant neoplasm of vulva, unspecified: Secondary | ICD-10-CM

## 2021-06-01 NOTE — Telephone Encounter (Signed)
Called patient and daughter to review results of imaging and plan. No answer. Left vm. Patient is on for case conference next week. Hope to see about getting lymph node biopsy. Awaiting appointment to get patient added to Dr. Elroy Channel schedule.

## 2021-06-04 ENCOUNTER — Telehealth: Payer: Self-pay | Admitting: Oncology

## 2021-06-04 ENCOUNTER — Other Ambulatory Visit: Payer: Self-pay | Admitting: Nurse Practitioner

## 2021-06-04 ENCOUNTER — Telehealth: Payer: Self-pay | Admitting: Nurse Practitioner

## 2021-06-04 NOTE — Telephone Encounter (Signed)
Left VM for daughter to return call in regards to scheduling a virtual appt with Dr. Janese Banks on Thursday. Daughter stated she would be out of town for current appt on Friday.

## 2021-06-04 NOTE — Telephone Encounter (Signed)
Patients daughter Tyra left vm requesting a call back.

## 2021-06-07 ENCOUNTER — Inpatient Hospital Stay: Payer: Medicare (Managed Care) | Attending: Oncology | Admitting: Oncology

## 2021-06-07 ENCOUNTER — Telehealth: Payer: Self-pay | Admitting: Hospice and Palliative Medicine

## 2021-06-07 DIAGNOSIS — C519 Malignant neoplasm of vulva, unspecified: Secondary | ICD-10-CM

## 2021-06-07 DIAGNOSIS — C7802 Secondary malignant neoplasm of left lung: Secondary | ICD-10-CM

## 2021-06-07 DIAGNOSIS — Z7189 Other specified counseling: Secondary | ICD-10-CM | POA: Diagnosis not present

## 2021-06-07 NOTE — Telephone Encounter (Signed)
Case discussed with Dr. Janese Banks.  PET scan reveals metastatic progression of disease.  Patient and daughter have decided to focus on comfort and not pursue surgery or additional treatments.  They are interested in hospice involvement.  Patient is currently a resident at [facility] and they utilize their own hospice program.  I called and spoke with their nursing director, Claiborne Billings, and updated her on patient's goals.  She says that she will coordinate getting hospice started today.  I then called and updated patient's daughter.

## 2021-06-08 ENCOUNTER — Inpatient Hospital Stay: Payer: Medicare (Managed Care) | Admitting: Oncology

## 2021-06-08 ENCOUNTER — Encounter: Payer: Self-pay | Admitting: Nurse Practitioner

## 2021-06-09 ENCOUNTER — Encounter: Payer: Self-pay | Admitting: Oncology

## 2021-06-09 NOTE — Progress Notes (Signed)
I connected with Hannah Howard on 41/66/06 at  9:00 AM EDT by video enabled telemedicine visit and verified that I am speaking with the correct person using two identifiers.   I discussed the limitations, risks, security and privacy concerns of performing an evaluation and management service by telemedicine and the availability of in-person appointments. I also discussed with the patient that there may be a patient responsible charge related to this service. The patient expressed understanding and agreed to proceed.  Other persons participating in the visit and their role in the encounter:  patients daughter  Patient's location:  nursing home Provider's location:  home  Chief Complaint:  discuss pet scan results and further management  History of present illness: Patient is a 80 year old female with a past medical history significant for Parkinson's disease and COPD.  She does not use any home oxygen.  She lives alone and her daughter and grandchildren watch over her care.  Patient has been complaining of labial pain and swelling and was referred to Dr. Ouida Sills by Dr. Doy Hutching.  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  She was also evaluated by GYN oncology and the tumor was large fungating and ulcerated measuring at least 7 cm involving the labia mid bulbar urethra and encroaching the vagina margin.  There was a concern for possible right inguinal lymph node.  PET CT scan showed no evidence of adenopathy or distant metastatic disease.  Intense hypermetabolic activity in the external genitalia with an SUV of 23.3.  Patient was not deemed to be a surgical candidate and referred for concurrent chemoradiation   Patient decided not to proceed with chemotherapy and proceeded with definitive radiation treatment in March 2022.  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  Baking soda irrigation and Silvadene cream was recommended and possible hyperbaric treatment as well.  Plan was possible EUA irrigation and vulvar biopsies.  She had a PET CT scan inJuly 2022 which showed interval development of bilateral pulmonary metastases with left lower lung mass measuring 3 x 2.2 cm with an SUV of 12.4.  Subcentimeter external iliac adenopathy.  Right inguinal lymph nodes measuring 3.6 x 3.7 cm with an SUV of 9.  Perineal/vulvar hypermetabolism with an SUV of 15.5  Interval history patient is currently at nursing home and is onFentanyl and as needed oxycodone for pain which is not presently well controlled.   Review of Systems  Constitutional:  Positive for malaise/fatigue. Negative for chills, fever and weight loss.  HENT:  Negative for congestion, ear discharge and nosebleeds.   Eyes:  Negative for blurred vision.  Respiratory:  Negative for cough, hemoptysis, sputum production, shortness of breath and wheezing.   Cardiovascular:  Negative for chest pain, palpitations, orthopnea and claudication.  Gastrointestinal:  Negative for abdominal pain, blood in stool, constipation, diarrhea, heartburn, melena, nausea and vomiting.  Genitourinary:  Negative for dysuria, flank pain, frequency, hematuria and urgency.       Vulvar pain  Musculoskeletal:  Negative for back pain, joint pain and myalgias.  Skin:  Negative for rash.  Neurological:  Negative for dizziness, tingling, focal weakness, seizures, weakness and headaches.  Endo/Heme/Allergies:  Does not bruise/bleed easily.  Psychiatric/Behavioral:  Negative for depression and suicidal ideas. The patient does not have insomnia.    Allergies  Allergen Reactions   Amoxicillin Rash  Amoxicillin-Pot Clavulanate Rash   Bronopol Rash    Patch test proven Allergic Contact dermatitis to Bronopol in patients soap products Patch test positive allergic contact  dermatitis to Bronopol in patients soap products    Past Medical History:  Diagnosis Date   Anxiety    Arthritis    Cancer ([place])    vulvar cancer   Complication of anesthesia    woke during on of her knee operations   COPD (chronic obstructive pulmonary disease) (HCC)    DDD (degenerative disc disease), lumbar    Depression    Diverticulosis    GERD (gastroesophageal reflux disease)    Headache    23-3x/month   History of colonic polyps    Hyperlipidemia    Hypertension    Irritable bowel syndrome    Migraine headache    Motion sickness    cars   Parkinson's disease (HCC)    Parkinsonism (HCC)    Vertigo    1 episode, recently   Vulvar cancer ([place])    Wears dentures    partial upper    Past Surgical History:  Procedure Laterality Date   BREAST BIOPSY Left 1973   neg   CATARACT EXTRACTION W/PHACO Right 09/19/2020   Procedure: CATARACT EXTRACTION PHACO AND INTRAOCULAR LENS PLACEMENT ([place]) RIGHT;  Surgeon: Birder Robson, MD;  Location: [facility];  Service: Ophthalmology;  Laterality: Right;  13.20 1:12.3   CATARACT EXTRACTION W/PHACO Left 10/10/2020   Procedure: CATARACT EXTRACTION PHACO AND INTRAOCULAR LENS PLACEMENT ([facility]) LEFT 8.86 00:51.5;  Surgeon: Birder Robson, MD;  Location: [facility];  Service: Ophthalmology;  Laterality: Left;   CHOLECYSTECTOMY     COLONOSCOPY WITH PROPOFOL N/A 04/07/2020   Procedure: COLONOSCOPY WITH PROPOFOL;  Surgeon: Robert Bellow, MD;  Location: [facility];  Service: Endoscopy;  Laterality: N/A;   KNEE ARTHROSCOPY Right 2010   KNEE ARTHROSCOPY Left 2012   TOTAL KNEE ARTHROPLASTY Right 10/18/2009   TOTAL KNEE ARTHROPLASTY Left 10/08/2010   TUBAL LIGATION      Social History   Socioeconomic History   Marital status: Divorced    Spouse name: Not on file   Number of children: Not on file   Years of education: Not on file   Highest education level: Not on file  Occupational History   Not on file   Tobacco Use   Smoking status: Former    Types: Cigarettes    Quit date: 07/02/2000    Years since quitting: 20.9   Smokeless tobacco: Never  Vaping Use   Vaping Use: Never used  Substance and Sexual Activity   Alcohol use: No    Alcohol/week: 0.0 standard drinks   Drug use: No   Sexual activity: Not Currently  Other Topics Concern   Not on file  Social History Narrative   Not on file   Social Determinants of Health   Financial Resource Strain: Not on file  Food Insecurity: Not on file  Transportation Needs: Not on file  Physical Activity: Not on file  Stress: Not on file  Social Connections: Not on file  Intimate Partner Violence: Not on file    Family History  Problem Relation Age of Onset   Heart disease Mother    Renal cancer Father    Breast cancer Neg Hx      Current Outpatient Medications:    carbidopa-levodopa (SINEMET CR) 50-200 MG tablet, Take 1 tablet by mouth at bedtime., Disp: , Rfl:    carbidopa-levodopa (SINEMET IR) 25-250 MG tablet,  Take 1 tablet by mouth 3 (three) times daily., Disp: , Rfl:    celecoxib (CELEBREX) 200 MG capsule, TAKE 1 CAPSULE BY MOUTH EVERY DAY, Disp: , Rfl:    doxepin (SINEQUAN) 25 MG capsule, Take 25 mg by mouth at bedtime., Disp: , Rfl:    fentaNYL 37.5 MCG/HR PT72, Place 1 patch onto the skin every 3 (three) days., Disp: 10 patch, Rfl: 0   gabapentin (NEURONTIN) 100 MG capsule, Take 100 mg by mouth daily., Disp: , Rfl:    hydrOXYzine (ATARAX/VISTARIL) 50 MG tablet, Take 50 mg by mouth 3 (three) times daily as needed., Disp: , Rfl:    ibuprofen (ADVIL) 400 MG tablet, Take 400 mg by mouth daily. PRN, Disp: , Rfl:    lansoprazole (PREVACID) 30 MG capsule, TAKE 1 CAPSULE BY MOUTH EVERY DAY, Disp: , Rfl:    oxyCODONE (OXY IR/ROXICODONE) 5 MG immediate release tablet, 1-2 tablets every 4 hours as needed, Disp: 120 tablet, Rfl: 0   Polyethylene Glycol 3350 (MIRALAX PO), Take 1 Dose by mouth daily as needed., Disp: , Rfl:    sertraline  (ZOLOFT) 100 MG tablet, TAKE ONE TABLET BY MOUTH EVERY DAY, Disp: , Rfl:    solifenacin (VESICARE) 10 MG tablet, Take 10 mg by mouth daily., Disp: , Rfl:    traZODone (DESYREL) 150 MG tablet, TAKE ONE TABLET BY MOUTH AT BEDTIME, Disp: , Rfl:    ANORO ELLIPTA 62.5-25 MCG/INH AEPB, Inhale 1 puff into the lungs daily. (Patient not taking: Reported on 06/07/2021), Disp: , Rfl:   NM PET Image Restag (PS) Skull Base To Thigh  Result Date: 05/30/2021 CLINICAL DATA:  subsequent treatment strategy for squamous cell carcinoma of the vulva. Evaluate treatment response to radiation therapy EXAM: NUCLEAR MEDICINE PET SKULL BASE TO THIGH TECHNIQUE: 10.8 mCi F-18 FDG was injected intravenously. Full-ring PET imaging was performed from the skull base to thigh after the radiotracer. CT data was obtained and used for attenuation correction and anatomic localization. Fasting blood glucose: 91 mg/dl COMPARISON:  12/13/2020 FINDINGS: Mediastinal blood pool activity: SUV max 2.5 Liver activity: SUV max NA NECK: No areas of abnormal hypermetabolism. Incidental CT findings: No cervical adenopathy. CHEST: Development of multiple bilateral hypermetabolic pulmonary nodules. Right apical 6 mm nodule measures a S.U.V. max of 2.8 A left upper lobe pulmonary nodule measures 8 mm and a S.U.V. max of 2.9 on 84/3. Anterior left lower lobe lung mass measures 3.0 x 2.2 cm and a S.U.V. max of 12.4 on 94/3. No thoracic nodal hypermetabolism. Incidental CT findings: Aortic and coronary artery calcification. Tiny hiatal hernia. ABDOMEN/PELVIS: Right-sided colonic diffuse hypermetabolism is without CT correlate and favored to be physiologic. Right external iliac node measures 8 mm and a S.U.V. max of 7.0 on 226/3. Right inguinal nodes with central necrosis. Maximally 3.6 x 3.7 cm and a S.U.V. max of 9.0 on 240/3. Perineal/vulvar hypermetabolism with corresponding edema and gas. Example at a S.U.V. max of 15.5 including on 248/3. Incidental CT  findings: Abdominal aortic atherosclerosis. Cholecystectomy. Colonic stool burden suggests constipation. Pelvic floor laxity. SKELETON: Relatively diffuse marrow hypermetabolism is new. Example within the L4 vertebral body at a S.U.V. max of 5.0. Anterior right humeral head hypermetabolism is somewhat more focal at a S.U.V. max of 5.0. Incidental CT findings: Osteopenia. Right greater than left hip osteoarthritis. Intra-articular loose bodies involve the right glenohumeral joint. Ninth through eleventh posterolateral left rib fractures are new. IMPRESSION: 1. Development of pulmonary metastasis. 2. Right external iliac and inguinal nodal metastasis. 3. Hypermetabolism  about the vulva, favoring residual disease. A component of this could be secondary to urinary contamination, especially given the clinical history of urinary incontinence. Perineal edema and foci of gas in the region of the vulva a could be treatment related. Consider physical exam correlation to exclude superimposed infection. 4. Relatively diffuse marrow hypermetabolism. Correlate with interval marrow stimulation by chemotherapy. If no interval expected marrow stimulation, osseous metastasis would be a concern. 5. Interval left lower rib fractures. 6. Incidental findings, including: Coronary artery atherosclerosis. Aortic Atherosclerosis (ICD10-I70.0). Electronically Signed   By: Abigail Miyamoto M.D.   On: 05/30/2021 09:36    No images are attached to the encounter.   CMP Latest Ref Rng & Units 05/16/2021  Glucose 70 - 99 mg/dL 125(H)  BUN 8 - 23 mg/dL 15  Creatinine 0.44 - 1.00 mg/dL 1.00  Sodium 135 - 145 mmol/L 131(L)  Potassium 3.5 - 5.1 mmol/L 4.3  Chloride 98 - 111 mmol/L 97(L)  CO2 22 - 32 mmol/L 26  Calcium 8.9 - 10.3 mg/dL 9.0  Total Protein 6.5 - 8.1 g/dL 7.1  Total Bilirubin 0.3 - 1.2 mg/dL 0.6  Alkaline Phos 38 - 126 U/L 102  AST 15 - 41 U/L 13(L)  ALT 0 - 44 U/L <5   CBC Latest Ref Rng & Units 05/16/2021  WBC 4.0 - 10.5  K/uL 17.5(H)  Hemoglobin 12.0 - 15.0 g/dL 8.8(L)  Hematocrit 36.0 - 46.0 % 28.1(L)  Platelets 150 - 400 K/uL 742(H)     Observation/objective: Patient is at nursing home and is essentially bedbound.  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  Patient had radiation treatment for her vulvar cancer in March 2022 which has now been complicated by radiation necrosis and difficult to control pain despite increasing doses of fentanyl and as needed oxycodone.  Plan was possible debulking surgery in the OR but PET CT scan results show hypermetabolic lung nodules with a dominant left lower lobe lung mass close to 3.5 cm as well as hypermetabolic inguinal adenopathy which is all concerning for metastatic disease.  Patient is overall frail and is not a candidate for any systemic chemotherapy.  We discussed following options moving forward  Speaking to radiology and considering inguinal lymph node or lung biopsy which will define that patient has stage IV disease but would not essentially change management as she is not a candidate for systemic chemotherapy. Based on PET CT scan results it appears that the patient has stage IV disease proceed with hospice at nursing home focusing on her quality of life and pain control rather than proceeding with debulking surgery  Patient and her daughter understand their options and are willing to proceed with hospice services and foregoing any debulking surgery at this time.  Patient is currently a resident at [facility] which has their own hospice program and we will set that up for the patient.  Follow-up instructions: No follow-up  needed  I discussed the assessment and treatment plan with the patient. The patient was provided an opportunity to ask questions and all were answered. The patient agreed with the plan and demonstrated an understanding of the instructions.   The patient was advised to call back or  seek an in-person evaluation if the symptoms worsen or if the condition fails to improve as anticipated.  I provided 30 minutes of face-to-face video visit time during this encounter, and > 50% was spent counseling as documented under my assessment & plan.  Visit Diagnosis: 1. Goals of care, counseling/discussion   2. Squamous cell carcinoma of vulva ([place])   3. Malignant neoplasm metastatic to left lung [facility])     Dr. Randa Evens, MD, MPH [facility] Tel- [phone] 06/09/2021 9:22 PM

## 2021-06-18 ENCOUNTER — Encounter: Payer: Self-pay | Admitting: Nurse Practitioner

## 2021-06-18 ENCOUNTER — Telehealth: Payer: Self-pay | Admitting: Nurse Practitioner

## 2021-06-18 DIAGNOSIS — Z7189 Other specified counseling: Secondary | ICD-10-CM

## 2021-06-18 NOTE — Telephone Encounter (Signed)
Called patient's daughter to provide her with Dr. Gershon Crane recommendations. No answer. Left voicemail. Dr. Theora Gianotti recommends cancelling surgery at [facility]. I've updated [place], NP.

## 2021-06-25 ENCOUNTER — Ambulatory Visit: Payer: Medicare (Managed Care) | Admitting: Cardiology

## 2021-06-27 ENCOUNTER — Inpatient Hospital Stay: Payer: BLUE CROSS/BLUE SHIELD

## 2021-07-26 DEATH — deceased

## 2022-09-23 ENCOUNTER — Encounter (INDEPENDENT_AMBULATORY_CARE_PROVIDER_SITE_OTHER): Payer: Self-pay
